# Patient Record
Sex: Male | Born: 1945 | Race: White | Hispanic: No | Marital: Married | Smoking: Former smoker
Health system: Southern US, Community
[De-identification: ages and names within clinical notes are randomized; demographics above are authoritative.]

## PROBLEM LIST (undated history)

## (undated) DIAGNOSIS — J449 Chronic obstructive pulmonary disease, unspecified: Secondary | ICD-10-CM

## (undated) DIAGNOSIS — G250 Essential tremor: Secondary | ICD-10-CM

## (undated) DIAGNOSIS — K589 Irritable bowel syndrome without diarrhea: Secondary | ICD-10-CM

## (undated) DIAGNOSIS — G4730 Sleep apnea, unspecified: Secondary | ICD-10-CM

## (undated) DIAGNOSIS — E781 Pure hyperglyceridemia: Secondary | ICD-10-CM

## (undated) DIAGNOSIS — T8859XA Other complications of anesthesia, initial encounter: Secondary | ICD-10-CM

## (undated) DIAGNOSIS — Z800 Family history of malignant neoplasm of digestive organs: Secondary | ICD-10-CM

## (undated) DIAGNOSIS — I6381 Other cerebral infarction due to occlusion or stenosis of small artery: Secondary | ICD-10-CM

## (undated) DIAGNOSIS — F429 Obsessive-compulsive disorder, unspecified: Secondary | ICD-10-CM

## (undated) DIAGNOSIS — M1990 Unspecified osteoarthritis, unspecified site: Secondary | ICD-10-CM

## (undated) DIAGNOSIS — R112 Nausea with vomiting, unspecified: Secondary | ICD-10-CM

## (undated) DIAGNOSIS — R251 Tremor, unspecified: Secondary | ICD-10-CM

## (undated) DIAGNOSIS — Z803 Family history of malignant neoplasm of breast: Secondary | ICD-10-CM

## (undated) DIAGNOSIS — C61 Malignant neoplasm of prostate: Secondary | ICD-10-CM

## (undated) DIAGNOSIS — K635 Polyp of colon: Secondary | ICD-10-CM

## (undated) DIAGNOSIS — T4145XA Adverse effect of unspecified anesthetic, initial encounter: Secondary | ICD-10-CM

## (undated) DIAGNOSIS — R0902 Hypoxemia: Secondary | ICD-10-CM

## (undated) DIAGNOSIS — K219 Gastro-esophageal reflux disease without esophagitis: Secondary | ICD-10-CM

## (undated) DIAGNOSIS — Z9889 Other specified postprocedural states: Secondary | ICD-10-CM

## (undated) HISTORY — DX: Family history of malignant neoplasm of breast: Z80.3

## (undated) HISTORY — DX: Chronic obstructive pulmonary disease, unspecified: J44.9

## (undated) HISTORY — DX: Polyp of colon: K63.5

## (undated) HISTORY — DX: Irritable bowel syndrome, unspecified: K58.9

## (undated) HISTORY — DX: Obsessive-compulsive disorder, unspecified: F42.9

## (undated) HISTORY — DX: Unspecified osteoarthritis, unspecified site: M19.90

## (undated) HISTORY — PX: TONSILLECTOMY: SUR1361

## (undated) HISTORY — PX: GREAT TOE ARTHRODESIS, INTERPHALANGEAL JOINT: SUR55

## (undated) HISTORY — PX: FINGER SURGERY: SHX640

## (undated) HISTORY — PX: KNEE ARTHROSCOPY: SUR90

## (undated) HISTORY — DX: Hypoxemia: R09.02

## (undated) HISTORY — DX: Pure hyperglyceridemia: E78.1

## (undated) HISTORY — DX: Malignant neoplasm of prostate: C61

## (undated) HISTORY — DX: Gastro-esophageal reflux disease without esophagitis: K21.9

## (undated) HISTORY — DX: Sleep apnea, unspecified: G47.30

## (undated) HISTORY — PX: PROSTATECTOMY: SHX69

## (undated) HISTORY — DX: Tremor, unspecified: R25.1

## (undated) HISTORY — DX: Family history of malignant neoplasm of digestive organs: Z80.0

## (undated) HISTORY — PX: BLADDER SURGERY: SHX569

## (undated) HISTORY — DX: Other cerebral infarction due to occlusion or stenosis of small artery: I63.81

## (undated) HISTORY — DX: Essential tremor: G25.0

---

## 2001-06-05 ENCOUNTER — Encounter (INDEPENDENT_AMBULATORY_CARE_PROVIDER_SITE_OTHER): Payer: Self-pay | Admitting: Specialist

## 2001-06-05 ENCOUNTER — Other Ambulatory Visit: Admission: RE | Admit: 2001-06-05 | Discharge: 2001-06-05 | Payer: Self-pay | Admitting: Gastroenterology

## 2003-01-12 ENCOUNTER — Encounter: Payer: Self-pay | Admitting: Internal Medicine

## 2003-01-12 ENCOUNTER — Ambulatory Visit (HOSPITAL_BASED_OUTPATIENT_CLINIC_OR_DEPARTMENT_OTHER): Admission: RE | Admit: 2003-01-12 | Discharge: 2003-01-12 | Payer: Self-pay | Admitting: Internal Medicine

## 2003-02-24 ENCOUNTER — Encounter: Payer: Self-pay | Admitting: Internal Medicine

## 2003-02-24 ENCOUNTER — Ambulatory Visit (HOSPITAL_BASED_OUTPATIENT_CLINIC_OR_DEPARTMENT_OTHER): Admission: RE | Admit: 2003-02-24 | Discharge: 2003-02-24 | Payer: Self-pay | Admitting: Internal Medicine

## 2004-08-12 ENCOUNTER — Ambulatory Visit: Payer: Self-pay | Admitting: Gastroenterology

## 2004-08-12 ENCOUNTER — Ambulatory Visit (HOSPITAL_COMMUNITY): Admission: RE | Admit: 2004-08-12 | Discharge: 2004-08-12 | Payer: Self-pay | Admitting: Gastroenterology

## 2005-05-13 ENCOUNTER — Emergency Department (HOSPITAL_COMMUNITY): Admission: EM | Admit: 2005-05-13 | Discharge: 2005-05-13 | Payer: Self-pay | Admitting: Family Medicine

## 2005-11-12 ENCOUNTER — Emergency Department (HOSPITAL_COMMUNITY): Admission: EM | Admit: 2005-11-12 | Discharge: 2005-11-12 | Payer: Self-pay | Admitting: Family Medicine

## 2005-11-22 ENCOUNTER — Ambulatory Visit (HOSPITAL_COMMUNITY): Admission: RE | Admit: 2005-11-22 | Discharge: 2005-11-22 | Payer: Self-pay | Admitting: Orthopedic Surgery

## 2005-12-22 ENCOUNTER — Ambulatory Visit (HOSPITAL_COMMUNITY): Admission: RE | Admit: 2005-12-22 | Discharge: 2005-12-23 | Payer: Self-pay | Admitting: Orthopedic Surgery

## 2007-10-15 ENCOUNTER — Encounter (INDEPENDENT_AMBULATORY_CARE_PROVIDER_SITE_OTHER): Payer: Self-pay | Admitting: Urology

## 2007-10-15 ENCOUNTER — Inpatient Hospital Stay (HOSPITAL_COMMUNITY): Admission: RE | Admit: 2007-10-15 | Discharge: 2007-10-16 | Payer: Self-pay | Admitting: Urology

## 2009-01-06 ENCOUNTER — Ambulatory Visit: Payer: Self-pay | Admitting: Internal Medicine

## 2009-01-06 DIAGNOSIS — J3089 Other allergic rhinitis: Secondary | ICD-10-CM

## 2009-01-06 DIAGNOSIS — J42 Unspecified chronic bronchitis: Secondary | ICD-10-CM

## 2009-01-06 DIAGNOSIS — J302 Other seasonal allergic rhinitis: Secondary | ICD-10-CM

## 2009-01-06 DIAGNOSIS — G4733 Obstructive sleep apnea (adult) (pediatric): Secondary | ICD-10-CM

## 2009-01-06 HISTORY — DX: Other seasonal allergic rhinitis: J30.2

## 2009-01-06 HISTORY — DX: Unspecified chronic bronchitis: J42

## 2009-01-06 HISTORY — DX: Obstructive sleep apnea (adult) (pediatric): G47.33

## 2009-01-08 ENCOUNTER — Encounter: Payer: Self-pay | Admitting: Internal Medicine

## 2009-01-09 ENCOUNTER — Encounter: Payer: Self-pay | Admitting: Internal Medicine

## 2009-01-09 ENCOUNTER — Ambulatory Visit: Payer: Self-pay | Admitting: Internal Medicine

## 2009-01-09 DIAGNOSIS — J45909 Unspecified asthma, uncomplicated: Secondary | ICD-10-CM

## 2009-01-09 HISTORY — DX: Unspecified asthma, uncomplicated: J45.909

## 2009-02-19 ENCOUNTER — Ambulatory Visit: Payer: Self-pay | Admitting: Internal Medicine

## 2009-04-27 ENCOUNTER — Ambulatory Visit: Payer: Self-pay | Admitting: Cardiovascular Disease

## 2009-04-30 ENCOUNTER — Ambulatory Visit: Payer: Self-pay

## 2009-04-30 ENCOUNTER — Ambulatory Visit: Payer: Self-pay | Admitting: Cardiovascular Disease

## 2009-04-30 ENCOUNTER — Encounter: Payer: Self-pay | Admitting: Cardiovascular Disease

## 2009-04-30 DIAGNOSIS — E7800 Pure hypercholesterolemia, unspecified: Secondary | ICD-10-CM

## 2009-04-30 HISTORY — DX: Pure hypercholesterolemia, unspecified: E78.00

## 2009-05-28 ENCOUNTER — Telehealth (INDEPENDENT_AMBULATORY_CARE_PROVIDER_SITE_OTHER): Payer: Self-pay | Admitting: *Deleted

## 2009-05-28 ENCOUNTER — Telehealth: Payer: Self-pay | Admitting: Internal Medicine

## 2009-06-04 ENCOUNTER — Telehealth: Payer: Self-pay | Admitting: Internal Medicine

## 2009-06-04 ENCOUNTER — Telehealth (INDEPENDENT_AMBULATORY_CARE_PROVIDER_SITE_OTHER): Payer: Self-pay | Admitting: *Deleted

## 2009-06-09 ENCOUNTER — Ambulatory Visit: Payer: Self-pay | Admitting: Internal Medicine

## 2009-06-29 ENCOUNTER — Encounter (INDEPENDENT_AMBULATORY_CARE_PROVIDER_SITE_OTHER): Payer: Self-pay | Admitting: *Deleted

## 2009-07-28 ENCOUNTER — Ambulatory Visit: Payer: Self-pay | Admitting: Cardiovascular Disease

## 2009-08-03 ENCOUNTER — Ambulatory Visit: Payer: Self-pay | Admitting: Gastroenterology

## 2009-08-13 ENCOUNTER — Ambulatory Visit: Payer: Self-pay | Admitting: Gastroenterology

## 2009-08-13 ENCOUNTER — Ambulatory Visit (HOSPITAL_COMMUNITY): Admission: RE | Admit: 2009-08-13 | Discharge: 2009-08-13 | Payer: Self-pay | Admitting: Gastroenterology

## 2009-10-19 ENCOUNTER — Ambulatory Visit: Payer: Self-pay | Admitting: Internal Medicine

## 2009-11-07 ENCOUNTER — Emergency Department (HOSPITAL_COMMUNITY): Admission: EM | Admit: 2009-11-07 | Discharge: 2009-11-07 | Payer: Self-pay | Admitting: Family Medicine

## 2009-12-25 ENCOUNTER — Telehealth (INDEPENDENT_AMBULATORY_CARE_PROVIDER_SITE_OTHER): Payer: Self-pay | Admitting: *Deleted

## 2010-08-27 ENCOUNTER — Telehealth (INDEPENDENT_AMBULATORY_CARE_PROVIDER_SITE_OTHER): Payer: Self-pay | Admitting: *Deleted

## 2010-10-28 NOTE — Assessment & Plan Note (Signed)
Summary: rov/jd   Primary Provider/Referring Provider:  Nadine Counts  CC:  Follow up visit- bad case of bronchitis in Nov; still having cough-non productive and tightness in chest. .  History of Present Illness: 01/09/09- OSA, COPD,recurrent allergic bronchitis He has delievered memory card from CPAP- download pending.-[facility]. Returns for review PFT- Mild obstructive airways disease in small airways with response to bronchodilator.  Skin Test: POS grass, weed, tree, dust mite  02/19/09- OSA, COPD/ recurrent allergic bornchitis Skin tested Pos Using CPAP all night every night, comfortable. Sleep quality definitely better. Does ask refill Nuvigil. Bronchitis- Better since Spring over. Can work outside now. Has not refilled Symbicort- using just Spiriva now. Ears feel full.  06/09/09- OSA, COPD/ recurrent allergic bronchitis-skin test positive. Allergies bother him some now. Astepro helps but dislikes taste. Spirivia willworks well and hasn't needed symbicort. CPAP compliance is great with nasal pillows mask.  October 19, 2009- OSA, COPD/ recurrent allergic bronchitis- skin test positive Had a bronchitis in November- Z pak then something else. Then got cortisone shot. He had used symbicort in past but wasn't sure it was effective. Cold air aggravates tight cough- dry. Wheezes. CPAP remains at 10  with good compliance.    Current Medications (verified): 1)  Multivitamins  Tabs (Multiple Vitamin) .... Take 1 By Mouth Once Daily 2)  Beconase Aq 42 Mcg/spray Susp (Beclomethasone Diprop Monohyd) .... As Needed 3)  Glucosamine 500 Mg Caps (Glucosamine Sulfate) .... Take 2 By Mouth Once Daily 4)  Singulair 10 Mg Tabs (Montelukast Sodium) .... Take 1 By Mouth Once Daily 5)  Nuvigil 250 Mg Tabs (Armodafinil) .[name].. 1 Daily If Needed 6)  [facility] Ibuprofen 200 Mg Tabs (Ibuprofen) .... Take As Needed 7)  Paxil 30 Mg Tabs (Paroxetine Hcl) .... Take 60mg  Once Daily 8)  Excedrin Extra Strength 250-250-65 Mg  Tabs (Aspirin-Acetaminophen-Caffeine) .... As Needed 9)  Propranolol Hcl 10 Mg Tabs (Propranolol Hcl) .... Take 1 By Mouth Once Daily 10)  Cpap 10 .... [facility] 11)  Spiriva Handihaler 18 Mcg Caps (Tiotropium Bromide Monohydrate) .[name].. 1 Daily 12)  Astepro 0.15 % Soln (Azelastine Hcl) .[name].. 1-2 Puffs Each Nostril Two Times A Day As Needed  Allergies (verified): 1)  ! Amoxicillin 2)  ! Levaquin 3)  ! Floxin  Past History:  Past Medical History: Last updated: 01/09/2009 ASTHMA (ICD-493.90) BRONCHITIS, CHRONIC (ICD-491.9) OBSTRUCTIVE SLEEP APNEA (ICD-327.23) ALLERGIC RHINITIS (ICD-477.9)  Family History: Last updated: 01/06/2009 Cancer  Social History: Last updated: 01/06/2009 [name] Married Quit smoking, ETOH, drugs- 1972  Past Surgical History: Prostatectomy Tonsillectomy UPPP  Review of Systems      See HPI       The patient complains of dyspnea on exertion and prolonged cough.  The patient denies anorexia, fever, weight loss, weight gain, vision loss, decreased hearing, hoarseness, chest pain, syncope, peripheral edema, headaches, hemoptysis, abdominal pain, and severe indigestion/heartburn.    Vital Signs:  Patient profile:   65 year old male Height:      70 inches Weight:      229.25 pounds BMI:     33.01 O2 Sat:      91 % on Room air Pulse rate:   88 / minute BP sitting:   112 / 64  (left arm) Cuff size:   regular  O2 Flow:  Room air  Physical Exam  Additional Exam:    General: A/Ox3; pleasant and cooperative, NAD, overweight SKIN: no rash, lesions, clammy NODES: no lymphadenopathy HEENT: [place]/AT, EOM- WNL, Conjuctivae- clear, PERRLA, TM-canals are  clear without fluid, cerumen, Nose- clear. Persistent dry sniffing, Throat- clear , S/P Palatoplasty, Melampatti II, husky voice without stridor NECK: Supple w/ fair ROM, JVD-1-2 cm, normal carotid impulses w/o bruits Thyroid-  CHEST: Clear to P&A, but cough noted once HEART: RRR, no m/g/r heard ABDOMEN:    GEX:BMWU, nl pulses, no edema . NEURO: Grossly intact to observation    Impression & Recommendations:  Problem # 1:  OBSTRUCTIVE SLEEP APNEA (ICD-327.23)  Good compliance and control. He recognizes he still gets tired occasionally. Reminded good sleep hygiene.  Problem # 2:  ASTHMA (ICD-493.90) Asthmatic bronchitis with exacerbation after infection. He continues Spiriva. I will add back at least a sample LABA/ steroid.  Medications Added to Medication List This Visit: 1)  Dulera 100-5 Mcg/act Aero (Mometasone furo-formoterol fum) .... 2 puffs and rinse twice daily  Other Orders: Est. Patient Level III (13244)  Patient Instructions: 1)  Please schedule a follow-up appointment in 4 months. 2)  Sample and script Dulera 100/5, 2 puffs and rinse, twice daily 3)  Continue CPAP Prescriptions: DULERA 100-5 MCG/ACT AERO (MOMETASONE FURO-FORMOTEROL FUM) 2 puffs and rinse twice daily  #1 x prn   Entered and Authorized by:   Waymon Budge MD   Signed by:   Waymon Budge MD on 10/19/2009   Method used:   Print then Give to Patient   RxID:   [id number]

## 2010-10-28 NOTE — Progress Notes (Signed)
Summary: nuvigil refill  Phone Note Refill Request Message from:  Pharmacy on [facility]  Refills Requested: Medication #1:  NUVIGIL 250 MG TABS 1 daily if needed Initial call taken by: Kandice Hams CMA,  August 27, 2010 4:14 PM Caller: [facility]    Prescriptions: NUVIGIL 250 MG TABS (ARMODAFINIL) 1 daily if needed  #30 x 5   Entered by:   Kandice Hams CMA   Authorized by:   Waymon Budge MD   Signed by:   Kandice Hams CMA on 08/27/2010   Method used:   Printed then faxed to ...       [facility] (retail)       [address]       [place], [place]  [id number]       Ph: [phone]       Fax: [phone]   RxID:   [id number]

## 2010-10-28 NOTE — Progress Notes (Signed)
Summary: REFILL on Nuvigil  Phone Note Call from Patient Call back at [phone]   Caller: Patient Call For: Daniel Terrell Summary of Call: NUVIGIL 250MG  90 DAY SUPPLY WITH REFILLS [facility] Initial call taken by: Rickard Patience,  December 25, 2009 11:21 AM  Follow-up for Phone Call        please advise if ok to refill. Thanks. Carron Curie CMA  December 25, 2009 11:28 AM   Additional Follow-up for Phone Call Additional follow up Details #1::        ok to refill X 5 months Additional Follow-up by: Waymon Budge MD,  December 25, 2009 1:33 PM    Additional Follow-up for Phone Call Additional follow up Details #2::    called and spoke with pt.  pt aware rx sent to pharmacy.  Aundra Millet Reynolds LPN  December 26, 4538 1:48 PM   Prescriptions: NUVIGIL 250 MG TABS (ARMODAFINIL) 1 daily if needed  #30 x 5   Entered by:   Arman Filter LPN   Authorized by:   Waymon Budge MD   Signed by:   Arman Filter LPN on 98/07/9146   Method used:   Telephoned to ...       [facility] (retail)       [address]       [address] Shipping/mailing       [place], [place]  [address]       Ph: [phone]       Fax: [phone]   RxID:   [id number]

## 2011-01-07 ENCOUNTER — Other Ambulatory Visit (HOSPITAL_COMMUNITY): Payer: Self-pay

## 2011-02-08 NOTE — Assessment & Plan Note (Signed)
[facility]                        [facility] OFFICE NOTE   Daniel Terrell, Daniel Terrell                    MRN:          [id number]  DATE:07/28/2009                            DOB:          12/06/1945    PROBLEM LIST:  1. Hypertriglyceridemia.  2. Obstructive sleep apnea on continuous positive airway pressure.  3. History of prostate cancer status post prostatectomy.   INTERVAL HISTORY:  The patient states since his last visit, he has been  getting along quite well.  He is trying to improve his physical  activity.  He has a treadmill at home that he uses infrequently, but  would like to start increasing the frequency of the exercise.  He denies  any episodes of chest discomfort.  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  He also  denies any episodes of dyspnea on exertion, lower extremity edema, or  syncope.   PHYSICAL EXAMINATION:  VITAL SIGNS:  Blood pressure is 111/79, pulse is  80, and sating 95% on room air.  He weighs 227 pounds, which is 1 pound  less than he weighed at the beginning of August.  GENERAL:  He is in no acute distress.  HEENT:  Nonfocal.  NECK:  Supple.  There are no carotid bruits.  There is no JVD.  HEART:  Regular rate and rhythm without murmur, rub, or gallop.  LUNGS:  Clear bilaterally.  ABDOMEN:  Soft, nontender, and nondistended.  EXTREMITIES:  Without edema.  SKIN:  Warm and dry.  PSYCH:  The patient is appropriate with normal levels of insight.   Review of the patient's labs the beginning of August, he had a  triglyceride level of 280, total cholesterol 180, HDL 40, and LDL 84.  Review of the patient's echocardiogram shows an ejection fraction of 55-  60% with mild AR and mild MR.   ASSESSMENT AND PLAN:  The patient is doing well from a cardiovascular  standpoint.  He is not having any signs or symptoms consistent with  ischemia, heart failure, or  arrhythmia.  His triglyceride levels are  elevated, but his non-HDL is within an acceptable range.  We spoke at  length about lifestyle modifications such as improved diet and increased  physical activity.  The patient will make a concerted effort at this.  We will see him back in 4 months' time, at which time we will recheck a  fasting lipid profile.  He should continue using his CPAP for sleep  apnea as well.    Brayton El, MD  Electronically Signed   SGA/MedQ  DD: 07/28/2009  DT: 07/29/2009  Job #: [id number]

## 2011-02-08 NOTE — Op Note (Signed)
NAME:  Daniel Terrell, Daniel Terrell NO.:  [id number]   MEDICAL RECORD NO.:  [id number]          PATIENT TYPE:  INP   LOCATION:  NA                           [facility]ACILITY[facility]  [facility]   PHYSICIAN:  Valetta Fuller, M.D.  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   PROCEDURE PERFORMED:  Robotic assisted laparoscopic radical retropubic  prostatectomy.   SURGEON:  Valetta Fuller, M.D.   ASSISTANT:  Heloise Purpura, MD.   ANESTHESIA:  General endotracheal.   INDICATIONS:  Daniel Terrell is a 65 year old male.  He was sent to me  with a minimally elevated PSA of approximately 4.1.  A digital rectal  exam was unremarkable.  He had a moderate to significant obstructive and  irritative voiding symptoms.  Biopsy revealed moderately enlarged  prostate of 40 grams.  The patient had low-volume Gleason's 3+3=6 tumor  involving about 5-10% of the tissue sampled at the right mid and left  apex of the prostate.  The patient had relatively normal sexual  functioning.  We underwent extensive discussion with him about treatment  options.  Because of the patient's young age and good health, we did  suggested consideration for surgical removal.  We discussed at length  the advantages and disadvantages of surgery versus radiation.  We  specifically discussed issues with regard to incontinence and sexual  dysfunction as well as the typical complications that can occur with  surgery of this magnitude.  The patient elected to proceed with robotic  prostatectomy.  He has done preoperative physical therapy consultation.  He presents now for the procedure.  The patient did have perioperative  Unasyn and compression boots placed.   TECHNIQUE AND  FINDINGS:  The patient was brought to the operating room  where he had successful induction of general endotracheal anesthesia.  He was placed in the mid lithotomy position.  All extremities were  carefully padded and he was secured to the bed.  He was placed in a  steep Trendelenburg position and prepped and draped in the usual manner.  Foley catheter was inserted sterilely on the field.   Initial incision site was chosen 18 cm above the pubic symphysis just to  the left of the umbilicus.  An open Hasson technique was utilized to  place the initial 12 mm trocar which was done without difficulty and  palpably no adhesions or other abnormalities were noted.  The abdomen  was insufflated without incident.  The pelvis was examined with the  laparoscopic and no obvious problems were noted.  All additional trocars  were placed with direct visual guidance including 12 mm and a 5 mm  assist port and three 8 mm robotic trocar ports.  Once all trocars were  positioned, the surgical cart was docked.   The bladder was filled to aid identification and then dropped  posteriorly.  Electrocautery scissors were  used to establish the proper  plane, allowing for [place] to enter the space of Retzius.  Superficial fat  overlying the prostatic fascia and the pelvic sidewall was taken off  with a combination of blunt and sharp dissecting technique and utilizing  some electrocautery.  Endopelvic fascia was then incised from apex to  the base of the prostate bilaterally.  Puboprostatic ligaments were  taken down.  Levator musculature was swept off the apex of the prostate  allowing [place] to isolate the dorsal venous complex which was then stapled.  Hemostasis was good.  Attention was then turned towards the bladder  neck.  The Foley catheter was used to identify the anterior bladder neck  which was then incised with hot electrocautery scissors until the  catheter was identified in the midline.  The catheter was then  brought  out and retracted anteriorly.  There was no evidence of a middle lobe.  Indigo carmine was given and we appeared to be well away from the  ureteral orifices.  The posterior bladder neck was then transected.  The  vas deferens and seminal vesicles were then identified posteriorly and  individually dissected free and retracted anteriorly.  The posterior  plane between the prostate and rectum was then established from the base  to the apex of the prostate and out laterally.   Attention was then turned towards nerve spare.  Superficial fascia over  the lateral aspect of the prostate was incised sharply and then swept  posteriorly.  We were able to establish a plane between the  neurovascular bundle and the outer superficial fascia of the prostate  and this was swept from apex to base.  Once this was accomplished were  able to lift up the base of the prostate and identify the vascular  pedicles which were then taken with hemoclips.  The last remaining bit  of the neurovascular bundle was then swept off the apex of the prostate.  The anterior urethra was then transected and the underlying catheter was  removed.  The posterior urethra was then transected and specimen was  brought out of the pelvis.  There appeared to be a good amount of  neurovascular bundle tissue on both sides.  No active bleeding or  problems.  Posterior fascia was then reapproximated to provide  additional bladder support utilizing a 2-0 Vicryl suture.  The 6 o'clock  urethral stump was then reanastomosed to the 6 o'clock position on the  bladder neck and those structures were then brought together to allow  for a tension-free anastomosis.  The rest the anastomosis was done with  a double-armed running 3-0 Monocryl suture in a 360 degree manner.  At  the completion of the anastomosis, a new catheter was placed without  difficulty.  The bladder was irrigated.  No evidence of extravasation  was noted.  We did not  feel the pelvic lymph node dissection was  necessary given his staging numbers with low-volume Gleason's 6 disease  and a PSA of only 4.  A drain was placed in the pelvis in the retropubic  space and then secured to the skin.  The 12 mm trocar site was closed  with a 0 Vicryl with the aid of suture passer under direct visual  guidance.  The prostate specimen was placed in the Endopouch bag.  All  other trocars were taken out with visual guidance and no active bleeding  was noted.  The camera port incision was extended slightly to allow for  removal of the specimen and that fascia was then closed with a running 0  Vicryl suture.  All port sites were infiltrated and then closed with  clips.  Urine was clear.  The patient had no obvious complications.  Blood loss was relatively minimal and no more than 200-300 mL.  He was  brought to recovery room in stable condition.           ______________________________  Valetta Fuller, M.D.  Electronically Signed     DSG/MEDQ  D:  10/15/2007  T:  10/15/2007  Job:  [id number]

## 2011-02-08 NOTE — Assessment & Plan Note (Signed)
Daniel Terrell                        [facility] NOTE   Daniel Terrell, Daniel Terrell                    MRN:          [id number]  DATE:04/27/2009                            DOB:          July 02, 1946    CHIEF COMPLAINT:  Abnormal EKG.   HISTORY OF PRESENT ILLNESS:  Daniel Terrell is a 65 year old white male  past medical history significant for obstructive sleep apnea on CPAP  therapy, borderline hyperlipidemia, asthma, history of prostate cancer,  status post prostatectomy who is presenting after his EKG was read as  abnormal by an outside physician.  Patient was currently enrolled in a  study that involved post-prostatectomy followup.  As part of the study,  he had a 12-lead EKG performed which was read as abnormal and indicated  he may have had a myocardial infarction in the past.  Unfortunately,  this EKG is not currently available for review.  Patient states that he  has no known cardiac history.  He denies any episodes of chest  discomfort or shortness of breath, although the patient states he does  not exert himself too much.  The most active he is is doing work around  the yard.  However, he has no difficulty doing this.   PAST MEDICAL HISTORY:  Patient had a laparoscopic prostatectomy 1-2  years ago.  He has obstructive sleep apnea, for which he uses a CPAP.  He has irritable bowel syndrome, diverticulosis, OCD, asthma, borderline  hyperlipidemia and left hand trigger finger, status post trigger finger  release.   SOCIAL HISTORY:  No tobacco, no alcohol since 1972.  He is a Engineer, production here in town.   FAMILY HISTORY:  Probably negative for premature coronary disease.  However, there is some question as to his paternal history.   ALLERGIES:  LEVAQUIN and PENICILLIN.  Patient states LEVAQUIN causes  disorientation, and PENICILLIN caused GI upset.   MEDICATIONS:  1. Testosterone gel daily.  2. Paxil 60 mg daily.  3. Provigil 250  mg daily.  4. Singulair 10 mg daily.  5. Spiriva 18 mcg daily.  6. Propranolol 20 mg daily.  7. Beconase 42 mcg b.i.d.  8. Glucosamine sulfate 1000 mg daily.  9. L-Lysine 500 mg as needed.  10.Men's multivitamin daily.  11.Omega-369 complex daily.   REVIEW OF SYSTEMS:  As in HPI.  All other systems were reviewed and are  negative.   PHYSICAL EXAMINATION:  VITAL SIGNS:  Pulse is 85, blood pressure 117/86,  saturating 96% on room air.  He weighs 228 pounds.  GENERALLY:  In no acute distress.  HEENT:  Not focal.  NECK:  Supple.  There is no lymphadenopathy, carotid bruit or JVD.  HEART:  Regular rate and rhythm without murmur, rub, or gallop.  LUNGS:  Clear bilaterally.  ABDOMEN:  Soft, nontender, nondistended.  EXTREMITIES:  Without edema.  SKIN:  Warm and dry without lesion or rash.  PULSES:  There are 2+ bilateral radial pulses.  NEURO:  Nonfocal.  PSYCHIATRIC:  Patient is appropriate with normal levels of insight.   An EKG from today independently reviewed by myself demonstrates normal  sinus rhythm with a leftward axis.  There is an isolated Q wave in lead  III.  However, there is a small R wave in aVF indicating that there  likely has not been an inferior Q-wave infarction.  There are no other  ST or  T-wave abnormalities that are significant.   LABORATORIES:  Not available at this time.   ASSESSMENT:  A 65 year old white male with obstructive sleep apnea and  borderline hyperlipidemia presenting with an EKG that is consistent with  a leftward axis but likely no evidence of history of obstructive  coronary disease or myocardial infarction.  Patient also is not  experiencing any symptoms or displaying any signs of obstructive  coronary disease, although his physical activity is somewhat limited.   PLAN:  Today in clinic, we would like to order a transthoracic  echocardiogram to ensure that his left ventricular systolic function is  within normal limits.  We would also  like to check a fasting lipid  profile in order to further assess his risk factor profile.  We will  contact the patient once the results of these studies are obtained, and  we will see him back in clinic in approximately 3 months' time unless a  problem were to arise in the interim.     Brayton El, MD  Electronically Signed    SGA/MedQ  DD: 04/27/2009  DT: 04/27/2009  Job #: [id number]

## 2011-02-08 NOTE — Letter (Signed)
April 27, 2009    Nadine Counts, M.D.  [address]  [place], [place] [address]   RE:  [name], [name]  MRN:  [id number]  /  DOB:  27-Apr-1946   Dear Dr. Harrison Mons:   I had the pleasure of seeing your patient, Daniel Terrell, in  Cardiology Clinic today regarding a possible abnormal EKG.  At the time  of this office visit, the initial EKG was not available.  However, a  repeat EKG today in the office showed normal sinus rhythm with a  leftward axis.  There does not appear to be any evidence of an old  inferior infarction.  There is an isolated Q wave in lead III.  However,  upon close inspection, aVF has a small R wave present.  We will order a  transthoracic echocardiogram in order to evaluate his left ventricular  systolic function and assess for any wall motion abnormalities.  We will  also recommend that he begin therapy with aspirin 81 mg daily for  prophylaxis.  Lastly, we have ordered a fasting lipid profile to further  assess his risk factor profile.  We see no current need for any imaging  studies.  However, the patient is informed that if he were to begin to  experience any new symptoms such as chest pain or shortness of breath,  that he should certainly return to our office for further evaluation.  I  hope that you are happy with this approach.  Thank you for the  opportunity to see this patient, and please feel free to contact our  office at any time with any questions or concerns.    Sincerely,      Brayton El, MD  Electronically Signed   SGA/MedQ  DD: 04/27/2009  DT: 04/27/2009  Job #: [id number]

## 2011-02-11 NOTE — Op Note (Signed)
NAMEDEBRA, Daniel Terrell             ACCOUNT NO.:  [id number]   MEDICAL RECORD NO.:  [id number]            PATIENT TYPE:   LOCATION:                                 FACILITY:   PHYSICIAN:  Georges Lynch. Darrelyn Hillock, M.D.     DATE OF BIRTH:   DATE OF PROCEDURE:  12/22/2005  DATE OF DISCHARGE:                                 OPERATIVE REPORT   SURGEON:  Georges Lynch. Darrelyn Hillock, M.D.   ASSISTANT:  Nurse.   PREOP DIAGNOSIS:  Severe tear to the medial meniscus of the left knee.   POSTOP DIAGNOSIS:  Severe tear to the medial meniscus of the left knee.   OPERATION:  1.  Diagnostic arthroscopy left knee.  2.  Medial meniscectomy left knee.   DESCRIPTION OF PROCEDURE:  Under general anesthesia, routine orthopedic prep  and drape of the left lower extremity is carried out.  I blocked the three  portals with 1% Xylocaine.  A small punctate incision made in the  suprapatellar pouch.  Inflow cannula was inserted and knee was distended  with saline.  Prior to surgery, he had 1 gram of IV Ancef.  At this time,  another small punctate incision made in the anterolateral joint.  Arthroscope was entered from the lateral approach and a complete diagnostic  arthroscopy was carried out.  He had a severe complex tear of the medial  meniscus.   I introduced the shaver suction device in a medial approach and did a medial  meniscectomy.  The remaining part of the knee joint was intact.  I then  utilized the ArthroCare bovie to cauterize the medial soft tissue structures  to control the minimal bleeding that we had.  I thoroughly irrigated out the  knee and removed the fluid.  I closed all 3 punctate incisions with 3-0  nylon suture.  I then injected 30 mL of 1/2% Marcaine with epinephrine into  the knee joint; and then applied a sterile Neosporin dressing.           ______________________________  Georges Lynch. Darrelyn Hillock, M.D.     RAG/MEDQ  D:  12/22/2005  T:  12/23/2005  Job:  [id number]

## 2011-02-18 ENCOUNTER — Other Ambulatory Visit (INDEPENDENT_AMBULATORY_CARE_PROVIDER_SITE_OTHER): Payer: 59

## 2011-02-18 ENCOUNTER — Ambulatory Visit (INDEPENDENT_AMBULATORY_CARE_PROVIDER_SITE_OTHER): Payer: 59 | Admitting: Gastroenterology

## 2011-02-18 ENCOUNTER — Encounter: Payer: Self-pay | Admitting: Gastroenterology

## 2011-02-18 VITALS — BP 104/72 | HR 76 | Ht 70.0 in | Wt 216.0 lb

## 2011-02-18 DIAGNOSIS — R7989 Other specified abnormal findings of blood chemistry: Secondary | ICD-10-CM

## 2011-02-18 LAB — COMPREHENSIVE METABOLIC PANEL
ALT: 31 U/L (ref 0–53)
AST: 21 U/L (ref 0–37)
Alkaline Phosphatase: 73 U/L (ref 39–117)
Chloride: 103 mEq/L (ref 96–112)
Glucose, Bld: 94 mg/dL (ref 70–99)
Sodium: 137 mEq/L (ref 135–145)
Total Bilirubin: 0.5 mg/dL (ref 0.3–1.2)
Total Protein: 6.9 g/dL (ref 6.0–8.3)

## 2011-02-18 LAB — CBC WITH DIFFERENTIAL/PLATELET
Basophils Absolute: 0 10*3/uL (ref 0.0–0.1)
Basophils Relative: 0.4 % (ref 0.0–3.0)
MCHC: 35 g/dL (ref 30.0–36.0)
Monocytes Relative: 9.9 % (ref 3.0–12.0)
Neutro Abs: 2.7 10*3/uL (ref 1.4–7.7)
Neutrophils Relative %: 63.1 % (ref 43.0–77.0)
RBC: 5 Mil/uL (ref 4.22–5.81)

## 2011-02-18 LAB — SEDIMENTATION RATE: Sed Rate: 9 mm/hr (ref 0–22)

## 2011-02-18 NOTE — Progress Notes (Signed)
Review of pertinent gastrointestinal problems: 1. Adenomas, SML, most recent colonoscopy 2010 no polyps; recall at 5 year interval     HPI: This is a  very pleasant 65 year old man whom I last saw the time of a surveillance colonoscopy one to 2 years ago.   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 Started iron pills, Mvi as well for many years.   He was in his neurologist office recently and had a ferritin level checked. This was 600.  Has lost 20 pounds recently, intentionally, eating a lighter BF and lunch and then health shakes.  Never told he had liver problems in the past.  No liver disease in family, no hemochromatosis.  No real new meds recently but inderol increased.  No GI symptoms.    Review of systems: Pertinent positive and negative review of systems were noted in the above HPI section.  All other review of systems was otherwise negative.   Past Medical History, Past Surgical History, Family History, Social History, Current Medications, Allergies were all reviewed with the patient via [facility] electronic medical record system.   Physical Exam: BP 104/72  Pulse 76  Ht 5\' 10"  (1.778 m)  Wt 216 lb (97.977 kg)  BMI 30.99 kg/m2 Constitutional: generally well-appearing Psychiatric: alert and oriented x3 Eyes: extraocular movements intact Mouth: oral pharynx moist, no lesions Neck: supple no lymphadenopathy Cardiovascular: heart regular rate and rhythm Lungs: clear to auscultation bilaterally Abdomen: soft, nontender, nondistended, no obvious ascites, no peritoneal signs, normal bowel sounds Extremities: no lower extremity edema bilaterally Skin: no lesions on visible extremities    Assessment and plan: 65 y.o. male with elevated ferritin  This may indicate underlying iron overload, it is also well known acute phase reactant. He will get a basic set of labs today including CBC, complete metabolic profile, sedimentation rate, total iron level, TIBC. If these  are all normal I would simply repeat his ferritin in about 2 weeks' time.

## 2011-02-18 NOTE — Patient Instructions (Signed)
You will have labs checked today in the basement lab.  Please head down after you check out with the front desk  (cbc, cmet, total iron, TIBC, esr).  If these are unrevealing, will likely repeat ferritin in 14 days. A copy of this information will be made available to Dr. Tomasa Blase, Sandria Manly.

## 2011-02-22 ENCOUNTER — Other Ambulatory Visit: Payer: Self-pay | Admitting: Gastroenterology

## 2011-03-08 ENCOUNTER — Other Ambulatory Visit (INDEPENDENT_AMBULATORY_CARE_PROVIDER_SITE_OTHER): Payer: 59

## 2011-03-08 LAB — FERRITIN: Ferritin: 396.4 ng/mL — ABNORMAL HIGH (ref 22.0–322.0)

## 2011-03-14 ENCOUNTER — Other Ambulatory Visit: Payer: Self-pay | Admitting: Gastroenterology

## 2011-03-14 DIAGNOSIS — R7989 Other specified abnormal findings of blood chemistry: Secondary | ICD-10-CM

## 2011-05-23 ENCOUNTER — Encounter: Payer: Self-pay | Admitting: Internal Medicine

## 2011-05-23 ENCOUNTER — Ambulatory Visit (INDEPENDENT_AMBULATORY_CARE_PROVIDER_SITE_OTHER): Payer: 59 | Admitting: Internal Medicine

## 2011-05-23 VITALS — BP 108/58 | HR 87 | Ht 70.0 in | Wt 214.8 lb

## 2011-05-23 DIAGNOSIS — J42 Unspecified chronic bronchitis: Secondary | ICD-10-CM

## 2011-05-23 DIAGNOSIS — J309 Allergic rhinitis, unspecified: Secondary | ICD-10-CM

## 2011-05-23 MED ORDER — MOMETASONE FUROATE 50 MCG/ACT NA SUSP: 2.0000 | Freq: Every day | NASAL | Status: DC

## 2011-05-23 MED ORDER — BECLOMETHASONE DIPROPIONATE 40 MCG/ACT IN AERS: 2.0000 | INHALATION_SPRAY | Freq: Two times a day (BID) | RESPIRATORY_TRACT | Status: DC

## 2011-05-23 NOTE — Patient Instructions (Signed)
Sample/ script  QVAR 40 maintenance inhaler- 2 puffs and rinse mouth, twice daily  Script Nasonex nasal spray to replace Beconase

## 2011-05-23 NOTE — Assessment & Plan Note (Signed)
Because of cost, switch from Beconase to nasonex

## 2011-05-23 NOTE — Assessment & Plan Note (Signed)
Over 3 weeks we have had a lot of rain, and earliest Fall weather changes. Cough is nonspecific.  Plan tramadol as cough suppressant at his request Try Qvar 40-

## 2011-05-23 NOTE — Progress Notes (Signed)
Subjective:    Patient ID: Daniel Terrell, male    DOB: [birth year], 65 y.o.   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 Having to sleep sitting up for 3 weeks at night. Denies fever, sore throat or malaise.  Expects sinus infection in November. In the Spring his PCP gave steroid shot and antibiotic for sinusitis- helped on 2 occasions.  Has been taking singulair, Beconase AQ, cetirizine. 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 He expresses interest in tramadol for cough- helps his wife.  Review of Systems Constitutional:   No-   weight loss, night sweats, fevers, chills, fatigue, lassitude. HEENT:   No-  headaches, difficulty swallowing, tooth/dental problems, sore throat,       No-  sneezing, itching, ear ache,         + nasal congestion, post nasal drip,  CV:  No-   chest pain, orthopnea, PND, swelling in lower extremities, anasarca, dizziness, palpitations Resp: No-   shortness of breath with exertion or at rest.              Per HPI;  No-  coughing up of blood.              No-   change in color of mucus.  No- wheezing.   Skin: No-   rash or lesions. GI:  No-   heartburn, indigestion, abdominal pain, nausea, vomiting, diarrhea,                 change in bowel habits, loss of appetite GU: No-   dysuria, change in color of urine, no urgency or frequency.  No- flank pain. MS:  No-   joint pain or swelling.  No- decreased range of motion.  No- back pain. Neuro- grossly normal to observation, Or:  Psych:  No- change in mood or affect. No depression or anxiety.  No memory loss.      Objective:   Physical Exam General- Alert, Oriented, Affect-appropriate, Distress- none acute   overweight Skin- rash-none, lesions- none,  excoriation- none Lymphadenopathy- none Head- atraumatic            Eyes- Gross vision intact, PERRLA, conjunctivae clear secretions            Ears- Hearing, canals normal            Nose- Clear, No- Septal dev, mucus, polyps, erosion, perforation             Throat- + S/P UPPP, mucosa clear , drainage- none, tonsils- atrophic Neck- flexible , trachea midline, no stridor , thyroid nl, carotid no bruit Chest - symmetrical excursion , unlabored           Heart/CV- RRR , no murmur , no gallop  , no rub, nl s1 s2                           - JVD- none , edema- none, stasis changes- none, varices- none           Lung- clear to P&A, wheeze- none, cough- none , dullness-none, rub- none   Not coughing at all while here           Chest wall-  Abd- tender-no, distended-no, bowel sounds-present, HSM- no  Br/ Gen/ Rectal- Not done, not indicated Extrem- cyanosis- none, clubbing, none, atrophy- none, strength- nl Neuro- grossly intact to observation         Assessment & Plan:

## 2011-05-24 ENCOUNTER — Telehealth: Payer: Self-pay | Admitting: Internal Medicine

## 2011-05-24 MED ORDER — ARMODAFINIL 250 MG PO TABS: 125.0000 mg | ORAL_TABLET | Freq: Every day | ORAL | Status: DC

## 2011-05-24 NOTE — Telephone Encounter (Signed)
Spoke with the pt and he requests 90 day supply. rx sent. Pt aware. Carron Curie, CMA

## 2011-06-15 ENCOUNTER — Encounter: Payer: Self-pay | Admitting: Cardiovascular Disease

## 2011-06-16 LAB — BASIC METABOLIC PANEL
BUN: 16
CO2: 28
CO2: 30
Chloride: 101
Chloride: 103
Creatinine, Ser: 0.81
Creatinine, Ser: 0.95
GFR calc Af Amer: >60
Potassium: 4.9

## 2011-06-16 LAB — ABO/RH: ABO/RH(D): O POS

## 2011-06-16 LAB — CBC
HCT: 38.7 — ABNORMAL LOW
Hemoglobin: 13.6
MCHC: 35
MCV: 92.5
RBC: 4.19 — ABNORMAL LOW

## 2011-06-16 LAB — DIFFERENTIAL
Basophils Relative: 1
Eosinophils Absolute: 0
Eosinophils Relative: 0
Lymphs Abs: 0.7
Monocytes Absolute: 0.6
Monocytes Relative: 6
Neutrophils Relative %: 85 — ABNORMAL HIGH

## 2011-06-16 LAB — TYPE AND SCREEN

## 2011-08-04 ENCOUNTER — Telehealth: Payer: Self-pay | Admitting: Internal Medicine

## 2011-08-04 MED ORDER — OLOPATADINE HCL 0.6 % NA SOLN: 2.0000 | Freq: Every day | NASAL | Status: DC

## 2011-08-04 MED ORDER — FLUTICASONE PROPIONATE 50 MCG/ACT NA SUSP: 2.0000 | Freq: Every day | NASAL | Status: DC

## 2011-08-04 NOTE — Telephone Encounter (Signed)
Pt states Dr. Maple Hudson suggested an alternative in the past for Beconase NS. Requesting same due to insurance no longer covering Beconase NS. I do see Nasonex on pt's medication list, however, the name did not sound familiar to pt. Also requesting a new prescription for Patanase NS, he uses this only as needed. Please advise. Thanks.   Allergies  Allergen Reactions  . Amoxicillin   . Levofloxacin     REACTION: disorientation  Can take Avelox  . Ofloxacin     REACTION: disorientation  Can take Avelox   Pharmacy: [facility]

## 2011-08-04 NOTE — Telephone Encounter (Signed)
Per CY-ok to refill patanase prn and ok to give Rx for Flonase #1 1-2 puffs each nostril daily at bedtime with prn refills.     Left message on patient's phone that we have sent the requested medications to the pharmacy on file-[facility].

## 2011-09-07 ENCOUNTER — Encounter: Payer: Self-pay | Admitting: Cardiovascular Disease

## 2011-10-11 ENCOUNTER — Telehealth: Payer: Self-pay | Admitting: Internal Medicine

## 2011-10-11 NOTE — Telephone Encounter (Signed)
LMOM TCB x1.  Flonase is to be taken 2 puffs once daily per rx.

## 2011-10-12 NOTE — Telephone Encounter (Signed)
LMOMTCB for pt. Spoke with [facility] pharmacist, Arlys John, and he is aware directions for Flonase are 2 sprays in each nostril once daily.

## 2011-10-13 NOTE — Telephone Encounter (Signed)
LMOM for pt TCB.  This is our 3rd attempt to contact pt with no call back from him.  Per protocol, will sign off on this message and wait for pt to call back.

## 2011-10-18 ENCOUNTER — Telehealth: Payer: Self-pay | Admitting: Internal Medicine

## 2011-10-18 NOTE — Telephone Encounter (Signed)
I spoke with pt and advised him we did not have any samples of flonase. Pt voiced his understanding and had no questions

## 2011-11-30 ENCOUNTER — Other Ambulatory Visit: Payer: Self-pay | Admitting: Internal Medicine

## 2011-12-05 ENCOUNTER — Telehealth: Payer: Self-pay | Admitting: Internal Medicine

## 2011-12-05 NOTE — Telephone Encounter (Signed)
Per records we had sent an rx for nuvigil with 1 yr supply on 04-2011. Per pharmacists we can only give 6 refills at a time for this med, so I gave a verbal ok to give the pt 345 tabs with 2 additonal refills to complete the 1 yr rx given on 8-12. Pt is aware. Carron Curie, CMA

## 2011-12-05 NOTE — Telephone Encounter (Signed)
Pt called again and hasn't heard anything

## 2011-12-07 NOTE — Telephone Encounter (Signed)
Electronic refill request for nuvigil 250mg  1 po qd.  #90 with 3 refills requested.  Last ov with CDY 8.27.12, follow up prn.  Dr Maple Hudson please advise if okay to fill.

## 2011-12-07 NOTE — Telephone Encounter (Signed)
Ok to refill needs OV to keep giving it long term.

## 2012-03-19 ENCOUNTER — Telehealth: Payer: Self-pay | Admitting: Internal Medicine

## 2012-03-19 MED ORDER — ARMODAFINIL 250 MG PO TABS: 250.0000 mg | ORAL_TABLET | Freq: Every day | ORAL | Status: DC

## 2012-03-19 NOTE — Telephone Encounter (Signed)
I have called RX to pharmacy and updated med list. I called to schedule patient an OV but had to leave a message.Will wait for patient to call me back to schedule OV.

## 2012-03-19 NOTE — Telephone Encounter (Signed)
Our original script was for 1/2 tab daily. I will ok a single 90 day script to take 1 full tab daily, = 90 tabs. He won't get any more until he comes in to review sleep status. He was last here in August, 2012.

## 2012-03-19 NOTE — Telephone Encounter (Signed)
I spoke with pt and he is needing a refill on his nuvigil 250 mg. It shows this rx was called in 12/05/11 #90 QD but per pt his bottle states 1/2 tablet daily #45 x 2 refills. I called [facility] out[facility] pharmacy to confirm this and she states this was how it was called in. Please advise if okay to refill nuvigil 250 mg QD #90 Dr. Maple Hudson thanks

## 2012-03-19 NOTE — Telephone Encounter (Signed)
Judie Grieve (Ilda Basset) at [facility] pharm called. Leanora Ivanoff

## 2012-03-20 NOTE — Telephone Encounter (Signed)
Per Florentina Addison I have scheduled a f/u appt for pt. His appt is 05-01-12. Pt is aware that rx has been called in as well. Nothing further needed at this time. Hazel Sams

## 2012-05-01 ENCOUNTER — Encounter: Payer: Self-pay | Admitting: Internal Medicine

## 2012-05-01 ENCOUNTER — Ambulatory Visit (INDEPENDENT_AMBULATORY_CARE_PROVIDER_SITE_OTHER): Payer: 59 | Admitting: Internal Medicine

## 2012-05-01 VITALS — BP 112/78 | HR 90 | Ht 70.0 in | Wt 222.2 lb

## 2012-05-01 DIAGNOSIS — J019 Acute sinusitis, unspecified: Secondary | ICD-10-CM

## 2012-05-01 DIAGNOSIS — G4733 Obstructive sleep apnea (adult) (pediatric): Secondary | ICD-10-CM

## 2012-05-01 DIAGNOSIS — J42 Unspecified chronic bronchitis: Secondary | ICD-10-CM

## 2012-05-01 NOTE — Patient Instructions (Addendum)
Sample Dymista nasal spray    1-2 puffs each nostril every night at bedtime. While the sample lasts, it replaces flonase and patanase  Please call as needed

## 2012-05-01 NOTE — Progress Notes (Signed)
Subjective:    Patient ID: Daniel Terrell, male    DOB: 21-Aug-1946, 66 y.o.   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 Having to sleep sitting up for 3 weeks at night. Denies fever, sore throat or malaise.  Expects sinus infection in November. In the Spring his PCP gave steroid shot and antibiotic for sinusitis- helped on 2 occasions.  Has been taking singulair, Beconase AQ, cetirizine. 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 He expresses interest in tramadol for cough- helps his wife.   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 He states that he has had one sinus infection in the past week-treated with Doxy and Kenolog injection. pt c/o PND left over from sinus infection. Onset was after trip to [place] with climate changes and airplane flights. Still has morning drainage and is sleeping in a chair. Chest/bronchitis bothers more than rhinitis. Using Flonase, not Nasonex or Patanase. Nuvigil continues to be a big help with daytime sleepiness. Sleep hygiene reviewed.  Review of Systems Constitutional:   No-   weight loss, night sweats, fevers, chills, fatigue, lassitude. HEENT:   No-  headaches, difficulty swallowing, tooth/dental problems, sore throat,       No-  sneezing, itching, ear ache,         + nasal congestion, post nasal drip,  CV:  No-   chest pain, orthopnea, PND, swelling in lower extremities, anasarca, dizziness, palpitations Resp: No-   shortness of breath with exertion or at rest.              Per HPI;  No-  coughing up of blood.              No-   change in color of mucus.   No- wheezing.   Skin: No-   rash or lesions. GI:  No-   heartburn, indigestion, abdominal pain, nausea, vomiting,  GU:  MS:  No-   joint pain or swelling.   Neuro- nothing unusual Psych:  No- change in mood or affect. No depression or anxiety.  No memory loss. Objective:   Physical Exam General- Alert, Oriented, Affect-appropriate, Distress- none acute   overweight Skin- rash-none, lesions- none, excoriation- none Lymphadenopathy- none Head- atraumatic            Eyes- Gross vision intact, PERRLA, conjunctivae clear secretions            Ears- Hearing, canals normal            Nose- sniffing but clear , No- Septal dev, mucus, polyps, erosion, perforation             Throat- + S/P UPPP, mucosa clear , drainage- none, tonsils- atrophic Neck- flexible , trachea midline, no stridor , thyroid nl, carotid no bruit Chest - symmetrical excursion , unlabored           Heart/CV- RRR , no murmur , no gallop  , no rub, nl s1 s2                           - JVD- none , edema- none, stasis changes- none, varices- none  Lung- clear to P&A, wheeze- none, cough- none , dullness-none, rub- none   Not coughing at all while here           Chest wall-  Abd-  Br/ Gen/ Rectal- Not done, not indicated Extrem- cyanosis- none, clubbing, none, atrophy- none, strength- nl Neuro- grossly intact to observation Assessment & Plan:

## 2012-05-06 DIAGNOSIS — J019 Acute sinusitis, unspecified: Secondary | ICD-10-CM

## 2012-05-06 HISTORY — DX: Acute sinusitis, unspecified: J01.90

## 2012-05-06 NOTE — Assessment & Plan Note (Signed)
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

## 2012-05-06 NOTE — Assessment & Plan Note (Signed)
Acute exacerbation of chronic bronchitis

## 2012-05-06 NOTE — Assessment & Plan Note (Signed)
Resolving rhinosinusitis. I think he has had enough antibiotics. Plan-sample Dymista nasal spray

## 2012-07-03 ENCOUNTER — Other Ambulatory Visit: Payer: Self-pay | Admitting: Internal Medicine

## 2012-07-03 NOTE — Telephone Encounter (Signed)
Please advise if okay to refill. Thanks.  

## 2012-07-03 NOTE — Telephone Encounter (Signed)
Ok to refill nuvigil

## 2012-09-20 ENCOUNTER — Other Ambulatory Visit: Payer: Self-pay | Admitting: Internal Medicine

## 2012-09-20 MED ORDER — FLUTICASONE PROPIONATE 50 MCG/ACT NA SUSP: 2.0000 | Freq: Every day | NASAL | Status: DC

## 2012-09-20 NOTE — Telephone Encounter (Signed)
Rx sent 

## 2012-09-20 NOTE — Telephone Encounter (Signed)
Pharmacy requesting  Fluticasone 50 mcg >use 2 sprays in eash nostril at bedtime  Last fill 03-28-12 Allergies  Allergen Reactions  . Amoxicillin   . Levofloxacin     REACTION: disorientation  Can take Avelox  . Ofloxacin     REACTION: disorientation  Can take Avelox   Dr Maple Hudson please advise  Thank you

## 2012-11-15 ENCOUNTER — Telehealth: Payer: Self-pay | Admitting: Internal Medicine

## 2012-11-16 NOTE — Telephone Encounter (Signed)
Forms and message on CY's cart to fill out.

## 2012-11-16 NOTE — Telephone Encounter (Signed)
Per LC she has the PA and to forward to her.

## 2012-11-16 NOTE — Telephone Encounter (Signed)
Ok to refill Nuvigil 

## 2012-11-16 NOTE — Telephone Encounter (Signed)
PA for Nuvigil 250 mg once daily. Member ID# [id number]. Call [employer] at [phone]. Forms received and given to [facility].  Will forward msg to [facility] for follow-up.

## 2012-11-20 NOTE — Telephone Encounter (Signed)
Ready- in [employer]

## 2012-11-20 NOTE — Telephone Encounter (Signed)
CY has this on his cart if working on this.

## 2012-11-20 NOTE — Telephone Encounter (Signed)
PA form faxed and placed in waiting for approval box in triage.  Will forward message to Florentina Addison to await approval.

## 2012-11-20 NOTE — Telephone Encounter (Signed)
Daniel Terrell, this was a Prior Auth not a refill request.  Pls advise if Prior Auth form has been completed.  Thank you.

## 2012-12-03 NOTE — Telephone Encounter (Signed)
Katie have you received approval yet. Please advise thanks

## 2012-12-12 NOTE — Telephone Encounter (Signed)
[employer] insurance several times-each time they are behind or closed. Will call Thursday AM(12-13-12)

## 2012-12-18 NOTE — Telephone Encounter (Signed)
Spoke with [employer] [place] rep; was informed that PA was approved from 11-20-12 through 08-16-2015; no waiting period for patient as well. Pt has been informed and will go get RX.  Auth # from [facility] for medication: [id number].  Sent forms to HIM to scan in EPIC.

## 2013-03-15 ENCOUNTER — Encounter: Payer: Self-pay | Admitting: Neurology

## 2013-04-19 ENCOUNTER — Other Ambulatory Visit: Payer: Self-pay | Admitting: Internal Medicine

## 2013-04-22 NOTE — Telephone Encounter (Signed)
Please advise if okay to refill. Thanks.  

## 2013-04-22 NOTE — Telephone Encounter (Signed)
Ok to refill 

## 2013-04-29 ENCOUNTER — Ambulatory Visit (INDEPENDENT_AMBULATORY_CARE_PROVIDER_SITE_OTHER): Payer: Medicare Other | Admitting: Neurology

## 2013-04-29 ENCOUNTER — Encounter: Payer: Self-pay | Admitting: Neurology

## 2013-04-29 VITALS — BP 120/91 | HR 126 | Temp 97.7°F | Ht 70.0 in | Wt 234.5 lb

## 2013-04-29 DIAGNOSIS — G252 Other specified forms of tremor: Secondary | ICD-10-CM

## 2013-04-29 DIAGNOSIS — G250 Essential tremor: Secondary | ICD-10-CM

## 2013-04-29 DIAGNOSIS — G2581 Restless legs syndrome: Secondary | ICD-10-CM

## 2013-04-29 MED ORDER — PRAMIPEXOLE DIHYDROCHLORIDE 0.125 MG PO TABS: 0.1250 mg | ORAL_TABLET | Freq: Every day | ORAL | Status: DC

## 2013-04-29 MED ORDER — PRIMIDONE 50 MG PO TABS: ORAL_TABLET | ORAL | Status: DC

## 2013-04-29 MED ORDER — PROPRANOLOL HCL ER 60 MG PO CP24: 60.0000 mg | ORAL_CAPSULE | Freq: Every day | ORAL | Status: DC

## 2013-04-29 NOTE — Patient Instructions (Addendum)
I think overall you are doing fairly well but I do want to suggest a few things today:  As far as your medications are concerned, I would like to suggest no change in Propranolol or mirapex, but trial of an additional medication for tremor: Mysoline (primidone) 50 mg strength: Take 1/2 pill each bedtime for 2 weeks, then 1 pill each bedtime for 2 weeks, then 1 1/2 pills each bedtime for 2 weeks, then 2 pills each bedtime thereafter. Common side effects reported are: Sleepiness, drowsiness, balance problems, confusion, and GI related symptoms.  As far as diagnostic testing: no new test today.  I would like to see you back in 4 months, sooner if we need to. Please call us with any interim questions, concerns, problems, updates or refill requests.  Please talk to Dr. Maple Hudson about getting a new CPAP machine, as the newer machines are quieter.   Brett Canales is my clinical assistant and will answer any of your questions and relay your messages to me and also relay most of my messages to you.  Our phone number is [phone]. We also have an after hours call service for urgent matters and there is a physician on-call for urgent questions. For any emergencies you know to call 911 or go to the nearest emergency room.

## 2013-04-29 NOTE — Progress Notes (Signed)
Subjective:    Patient ID: Daniel Terrell is a 67 y.o. male.  HPI  Interim history:   Daniel Terrell is a very pleasant 67 year old right-handed gentleman who presents for followup consultation of his essential tremor. He is unaccompanied today. He previously saw Dr. Fayrene Terrell love and was last seen by him on 11/13/2012 at which time he was switched to long-acting propranolol. Dr. Sandria Terrell was hesitant to put him on primidone in view of his memory loss. His last MMSE was 27. He also has an underlying medical history of asthma so Dr. Sandria Terrell was hesitant to increase his propranolol and dose. There is a family history of kidney stones but no personal history of kidney stones and he was also hesitant to try him on Topamax because of this as well as the cognitive side effects that can produce. The patient has an underlying medical history of prostate cancer, status post surgery in January 2009, Sleep apnea on CPAP, asthma, OCD, restless leg syndrome, and memory loss. He is currently on Xanax as needed, baby aspirin, Flonase, Paxil, multivitamin, individual, Singulair, Zyrtec, facial, AndroGel, propranolol ER 60 mg, pramipexole 0.125 mg strength one tablet at supper and one at bedtime.  I reviewed Dr. Imagene Terrell prior notes and the patient's records and below is a summary of his review:  67 year old gentleman with a history of tremor which started in his 30s. This has gradually been progressive. He has had difficulty with penmanship as well as at work as he was a Landscape architect. He has difficulty with other activities such as playing the guitar. He was started on Xanax in 2012 by his primary care physician and was started on propranolol 20 mg 3 times a day by Dr. love this was decreased to 20 mg daily. He had fatigue. He has a family history of tremor. He also has restless leg syndrome and has been taking iron and was tried on ropinirole without benefit. He is now on Mirapex. He has seen Dr. Maple Terrell for his sleep apnea and is  currently not using the CPAP. He has had memory loss since his prostate surgery.  He feels, that going on long acting Propranolol has helped, but he still has a tremor. It has affected his hands, but not his neck or jaw or voice. His RLS is under control with Mirapex 0.125 mg strength one pill at night only. He has actually not taken it twice daily.  His Past Medical History Is Significant For: Past Medical History  Diagnosis Date  . Arthritis   . Asthma   . Prostate cancer   . Colon polyps   . COPD (chronic obstructive pulmonary disease)   . OCD (obsessive compulsive disorder)   . Hypertriglyceridemia   . IBS (irritable bowel syndrome)   . Sleep apnea     His Past Surgical History Is Significant For: Past Surgical History  Procedure Laterality Date  . Prostatectomy    . Knee arthroscopy      left  . Great toe arthrodesis, interphalangeal joint    . Finger surgery      left x 3  . Bladder surgery      extension  . Tonsillectomy      His Family History Is Significant For: Family History  Problem Relation Age of Onset  . Breast cancer Maternal Grandmother   . Colon cancer Maternal Grandfather   . Colon polyps Mother     son    His Social History Is Significant For: History   Social History  .  Marital Status: Married    Spouse Name: Daniel Terrell    Number of Children: 3  . Years of Education: BA   Occupational History  . pastor   . Retired    Social History Main Topics  . Smoking status: Former Smoker    Types: Cigarettes    Quit date: 09/26/1970  . Smokeless tobacco: Never Used  . Alcohol Use: No  . Drug Use: No  . Sexually Active: None   Other Topics Concern  . None   Social History Narrative   Patient lives at home with spouse.   Caffeine Use: 2-3 cups daily    His Allergies Are:  Allergies  Allergen Reactions  . Amoxicillin   . Levofloxacin     REACTION: disorientation  Can take Avelox  . Ofloxacin     REACTION: disorientation  Can take Avelox   :   His Current Medications Are:  Outpatient Encounter Prescriptions as of 04/29/2013  Medication Sig Dispense Refill  . ALPRAZolam (XANAX) 0.5 MG tablet Take 0.5 mg by mouth as needed.        [name] aspirin 81 MG tablet Take 81 mg by mouth daily.        . cetirizine (ZYRTEC) 10 MG tablet Take 10 mg by mouth daily.        . fluticasone (FLONASE) 50 MCG/ACT nasal spray Place 2 sprays into the nose at bedtime.  1 g  9  . glucosamine-chondroitin 500-400 MG tablet Take 1 tablet by mouth 2 (two) times daily.        . montelukast (SINGULAIR) 10 MG tablet Take 10 mg by mouth daily.        . Multiple Vitamins-Minerals (MULTIVITAL PO) Take 1 tablet by mouth daily.        [name] NUVIGIL 250 MG tablet TAKE 1 TABLET BY MOUTH ONCE DAILY  90 tablet  2  . Omega-3 Fatty Acids (FISH OIL) 1000 MG CAPS Take 1 capsule by mouth daily.        [name] PARoxetine (PAXIL) 30 MG tablet Take 60 mg by mouth at bedtime as needed.       . pramipexole (MIRAPEX) 0.125 MG tablet Take 1 tablet (0.125 mg total) by mouth at bedtime.  90 tablet  3  . Testosterone 1.25 GM/ACT (1%) GEL Place 2 Squirts onto the skin daily.       . [DISCONTINUED] pramipexole (MIRAPEX) 0.125 MG tablet Take 0.125 mg by mouth daily.        . [DISCONTINUED] propranolol (INDERAL) 20 MG tablet Take 20 mg by mouth daily.       . beclomethasone (QVAR) 40 MCG/ACT inhaler Inhale 2 puffs into the lungs 2 (two) times daily. Rinse mouth  1 Inhaler  2  . mometasone (NASONEX) 50 MCG/ACT nasal spray Place 2 sprays into the nose daily.  17 g  12  . primidone (MYSOLINE) 50 MG tablet 1/2 pill each bedtime x 2 weeks, then 1 pill nightly x 2 weeks, then 1 1/2 pills nightly x 2 weeks, then 2 pills nightly thereafter.  60 tablet  3  . propranolol ER (INDERAL LA) 60 MG 24 hr capsule Take 1 capsule (60 mg total) by mouth daily.  90 capsule  3  . [DISCONTINUED] Beclomethasone Diprop Monohyd (BECONASE AQ NA) 2 Squirts by Nasal route 2 (two) times daily.        . [DISCONTINUED] Olopatadine  HCl 0.6 % SOLN Place 2 drops (2 puffs total) into the nose daily.  1 Bottle  11   No facility-administered encounter medications on file as of 04/29/2013.   Review of Systems  Constitutional: Positive for fatigue.  Respiratory: Positive for cough, shortness of breath and wheezing.        Snoring  Neurological: Positive for tremors.       Memory loss  Psychiatric/Behavioral: Positive for sleep disturbance (sleepiness, snoring, restless legs).    Objective:  Neurologic Exam  Physical Exam Physical Examination:   Filed Vitals:   04/29/13 1101  BP: 120/91  Pulse: 126  Temp: 97.7 F (36.5 C)    General Examination: The patient is a very pleasant 67 y.o. male in no acute distress. He appears well-developed and well-nourished and well groomed.   HEENT: Normocephalic, atraumatic, pupils are equal, round and reactive to light and accommodation. Extraocular tracking is good without limitation to gaze excursion or nystagmus noted. Normal smooth pursuit is noted. Hearing is grossly intact. Tympanic membranes are clear bilaterally. Face is symmetric with normal facial animation and normal facial sensation. Speech is clear with no dysarthria noted. There is no hypophonia. There is no lip, neck/head, jaw or voice tremor. Neck is supple with full range of passive and active motion. There are no carotid bruits on auscultation. Oropharynx exam reveals: mild mouth dryness, good dental hygiene and mildly redundant airway with absent tonsils and absent uvula. Mallampati is class II. Tongue protrudes centrally and palate elevates symmetrically.   Chest: Clear to auscultation without wheezing, rhonchi or crackles noted.  Heart: S1+S2+0, regular and normal without murmurs, rubs or gallops noted.   Abdomen: Soft, non-tender and non-distended with normal bowel sounds appreciated on auscultation.  Extremities: There is no pitting edema in the distal lower extremities bilaterally. Pedal pulses are  intact.  Skin: Warm and dry without trophic changes noted. There are no varicose veins.  Musculoskeletal: exam reveals no obvious joint deformities, tenderness or joint swelling or erythema.   Neurologically:  Mental status: The patient is awake, alert and oriented in all 4 spheres. His memory, attention, language and knowledge are appropriate. There is no aphasia, agnosia, apraxia or anomia. Speech is clear with normal prosody and enunciation. Thought process is linear. Mood is congruent and affect is normal.  Cranial nerves are as described above under HEENT exam. In addition, shoulder shrug is normal with equal shoulder height noted. Motor exam: Normal bulk, strength and tone is noted. There is a minimal resting tremor in both UEs, and a mild to moderate bilateral UE postural and action tremor. On Archimedes spiral drawing he has mild to moderate tremulousness. His handwriting is reviewed and it is mildly tremulous but legible. No drift and no rebound. Romberg is negative. Reflexes are 2+ throughout. Fine motor skills are intact with normal finger taps, normal hand movements, normal rapid alternating patting, normal foot taps and normal foot agility.  Cerebellar testing shows no dysmetria or intention tremor on finger to nose testing. Heel to shin is unremarkable bilaterally. There is no truncal or gait ataxia.  Sensory exam is intact to light touch, pinprick, vibration, temperature sense and proprioception in the upper and lower extremities.  Gait, station and balance are unremarkable. No veering to one side is noted. No leaning to one side is noted. Posture is age-appropriate and stance is narrow based. No problems turning are noted. He turns en bloc. Tandem walk is unremarkable. Intact toe and heel stance is noted.               Assessment and Plan:   Assessment and  Plan:  In summary, Daniel Terrell is a very pleasant 66 y.o.-year old male with a history of essential tremor, affecting both  upper extremities and a history of restless leg syndrome. His memory loss has plateaued in his perception. He is advised to continue with his propranolol for tremors as well as Mirapex for restless leg syndrome. As an additional medication for his residual tremor I suggested a low dose Mysoline with a slow titration. I talked to him about potential side effects including confusion and sedation. I did ask him to report any side effects to me immediately. We will start with 50 mg strength half a pill at bedtime only and titrate every 2 weeks to up to 100 mg at night. I provided him with a new prescription for this medication and also encouraged him to talk to his pulmonary doctor about perhaps getting a new CPAP machine that may be quieter. He has an old machine that is over 95 years old. He is advised that memory loss can be linked to untreated OSA. He has an appointment coming up with Dr. Maple Terrell in.less than 2 weeks. I would like to see him back in about 4 months, sooner if the need arises and I have encouraged him to call with any interim questions, concerns, problems or updates and refill requests.  he was in agreement. If Mysoline works out well for him he is encouraged to call for a 90 day prescription so we can send it to [facility].

## 2013-05-07 ENCOUNTER — Encounter: Payer: Self-pay | Admitting: Internal Medicine

## 2013-05-07 ENCOUNTER — Ambulatory Visit (INDEPENDENT_AMBULATORY_CARE_PROVIDER_SITE_OTHER): Payer: Medicare Other | Admitting: Internal Medicine

## 2013-05-07 VITALS — BP 138/84 | HR 93 | Ht 71.0 in | Wt 237.0 lb

## 2013-05-07 DIAGNOSIS — J302 Other seasonal allergic rhinitis: Secondary | ICD-10-CM

## 2013-05-07 DIAGNOSIS — J45909 Unspecified asthma, uncomplicated: Secondary | ICD-10-CM

## 2013-05-07 DIAGNOSIS — J309 Allergic rhinitis, unspecified: Secondary | ICD-10-CM

## 2013-05-07 DIAGNOSIS — G4733 Obstructive sleep apnea (adult) (pediatric): Secondary | ICD-10-CM

## 2013-05-07 NOTE — Progress Notes (Signed)
Subjective:    Patient ID: Daniel Terrell, male    DOB: 25-Aug-1946, 67 y.o.   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 Having to sleep sitting up for 3 weeks at night. Denies fever, sore throat or malaise.  Expects sinus infection in November. In the Spring his PCP gave steroid shot and antibiotic for sinusitis- helped on 2 occasions.  Has been taking singulair, Beconase AQ, cetirizine. 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 He expresses interest in tramadol for cough- helps his wife.   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 He states that he has had one sinus infection in the past week-treated with Doxy and Kenolog injection. pt c/o PND left over from sinus infection. Onset was after trip to [place] with climate changes and airplane flights. Still has morning drainage and is sleeping in a chair. Chest/bronchitis bothers more than rhinitis. Using Flonase, not Nasonex or Patanase. Nuvigil continues to be a big help with daytime sleepiness. Sleep hygiene reviewed.  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  Machine is noisy and sweats alot on head with mask on.  Occas wheezing and occas cough - prod at times.  No SOB, chest tightness, or chest pain. Complains CPAP nasal pillows mask face sweaty. Stopped using CPAP 7 or 8 months ago. Nasal congestion better control with Singulair. Occasional cough without wheeze if works  outdoors.  Review of Systems- see HPI Constitutional:   No-   weight loss, night sweats, fevers, chills, fatigue, lassitude. HEENT:   No-  headaches, difficulty swallowing, tooth/dental problems, sore throat,       + sneezing, itching, ear ache, + nasal congestion, post nasal drip,  CV:  No-   chest pain, orthopnea, PND, swelling in lower extremities, anasarca, dizziness, palpitations Resp: No-   shortness of breath with exertion or at rest.              Per HPI;  No-  coughing up of blood.              No-   change in color of mucus.  + wheezing.   Skin: No-   rash or lesions. GI:  No-   heartburn, indigestion, abdominal pain, nausea, vomiting,  GU:  MS:  No-   joint pain or swelling.   Neuro- nothing unusual Psych:  No- change in mood or affect. No depression or anxiety.  No memory loss. Objective:   Physical Exam General- Alert, Oriented, Affect-appropriate, Distress- none acute   overweight Skin- rash-none, lesions- none, excoriation- none Lymphadenopathy- none Head- atraumatic            Eyes- Gross vision intact, PERRLA, conjunctivae clear secretions            Ears- Hearing, canals normal            Nose- +sniffing but clear , No- Septal dev, mucus, polyps, erosion, perforation             Throat- + S/P UPPP, mucosa clear , drainage- none, tonsils- atrophic Neck-  flexible , trachea midline, no stridor , thyroid nl, carotid no bruit Chest - symmetrical excursion , unlabored           Heart/CV- RRR , no murmur , no gallop  , no rub, nl s1 s2                           - JVD- none , edema- none, stasis changes- none, varices- none           Lung- clear to P&A, wheeze- none, cough- none , dullness-none, rub- none   Not coughing at all while here           Chest wall-  Abd-  Br/ Gen/ Rectal- Not done, not indicated Extrem- cyanosis- none, clubbing, none, atrophy- none, strength- nl Neuro- grossly intact to observation Assessment & Plan:

## 2013-05-07 NOTE — Patient Instructions (Addendum)
Order- referral to Orthodontics Dr Althea Grimmer  Dx OSA consider oral appliance  We can address the nasal symptoms and cough if needed  Please call as needed

## 2013-05-22 NOTE — Assessment & Plan Note (Addendum)
NPSG 01/12/03 AHI 42/ hr, loud snore, weight 200 lbs Unable to get comfortable with CPAP. He has been using Nuvigil and Xanax we discussed these. He may be a reasonable candidate for an oral appliance. Will refer. Also advised weight loss.

## 2013-05-22 NOTE — Assessment & Plan Note (Signed)
Antihistamines may be sufficient but we talked again about nasal sprays.

## 2013-05-22 NOTE — Assessment & Plan Note (Signed)
We considered rescue inhaler but not really needed now.

## 2013-06-21 ENCOUNTER — Telehealth: Payer: Self-pay | Admitting: Internal Medicine

## 2013-06-21 MED ORDER — MODAFINIL 100 MG PO TABS
ORAL_TABLET | ORAL | Status: DC
Start: 2013-06-21 — End: 2013-06-24

## 2013-06-21 NOTE — Telephone Encounter (Signed)
I spoke with spouse and is aware rx has been sent. They request 90 day supply. Nothing further needed

## 2013-06-21 NOTE — Telephone Encounter (Signed)
I spoke with pt. He stated the nuvigil will cost him $100 a month. He stated he can't afford that. He stated an alternative would be provigil bc it comes in generic. Please advise Dr. Maple Hudson thanks Last OV 05/07/13

## 2013-06-21 NOTE — Telephone Encounter (Signed)
Per CY-try Rx Provigil 100 mg #0 take 1 every morning prn with 5 refills.

## 2013-06-24 ENCOUNTER — Telehealth: Payer: Self-pay | Admitting: Internal Medicine

## 2013-06-24 MED ORDER — MODAFINIL 200 MG PO TABS: 200.0000 mg | ORAL_TABLET | Freq: Every day | ORAL | Status: DC

## 2013-06-24 NOTE — Telephone Encounter (Signed)
Spoke with patient, patient had Provigil 100mg  called into [facility] 9/26 Patient states he was taking Nuvigil 250mg  prior and he feels the Provigil is not strong enough for him as he is till falling asleep Dr. Maple Hudson please advise if Rx for Provigil 200mg  may be sent to [facility] Thank You!  Last OV 05/07/13 w 1 yr follow up not scheduled at this time N  Current Outpatient Prescriptions on File Prior to Visit  Medication Sig Dispense Refill  . ALPRAZolam (XANAX) 0.5 MG tablet Take 0.5 mg by mouth as needed.        [name] aspirin 81 MG tablet Take 81 mg by mouth daily.        . beclomethasone (QVAR) 40 MCG/ACT inhaler Inhale 2 puffs into the lungs 2 (two) times daily. Rinse mouth  1 Inhaler  2  . cetirizine (ZYRTEC) 10 MG tablet Take 10 mg by mouth daily.        . fluticasone (FLONASE) 50 MCG/ACT nasal spray Place 2 sprays into the nose daily as needed.      [name] glucosamine-chondroitin 500-400 MG tablet Take 1 tablet by mouth 2 (two) times daily.        . modafinil (PROVIGIL) 100 MG tablet 1 tablet every morning as needed  90 tablet  1  . montelukast (SINGULAIR) 10 MG tablet Take 10 mg by mouth daily.        . Multiple Vitamins-Minerals (MULTIVITAL PO) Take 1 tablet by mouth daily.        [name] NUVIGIL 250 MG tablet TAKE 1 TABLET BY MOUTH ONCE DAILY  90 tablet  2  . Omega-3 Fatty Acids (FISH OIL) 1000 MG CAPS Take 1 capsule by mouth daily.        [name] PARoxetine (PAXIL) 30 MG tablet Take 60 mg by mouth at bedtime as needed.       . pramipexole (MIRAPEX) 0.125 MG tablet Take 1 tablet (0.125 mg total) by mouth at bedtime.  90 tablet  3  . primidone (MYSOLINE) 50 MG tablet 1/2 pill each bedtime x 2 weeks, then 1 pill nightly x 2 weeks, then 1 1/2 pills nightly x 2 weeks, then 2 pills nightly thereafter.  60 tablet  3  . propranolol ER (INDERAL LA) 60 MG 24 hr capsule Take 1 capsule (60 mg total) by mouth daily.  90 capsule  3  . Testosterone 1.25 GM/ACT (1%) GEL Place 2 Squirts onto the skin daily.         No current facility-administered medications on file prior to visit.

## 2013-06-24 NOTE — Telephone Encounter (Signed)
Ok to change his script to Provigil 200 mg, # 90, 1 daily as needed

## 2013-06-24 NOTE — Telephone Encounter (Signed)
Spoke with patient patient is aware Rx has been phoned in to [facility] [employer] has been cancelled Nothing further needed at this time

## 2013-07-09 ENCOUNTER — Telehealth: Payer: Self-pay | Admitting: Internal Medicine

## 2013-07-09 NOTE — Telephone Encounter (Signed)
Florentina Addison do you have any forms on this patient? Carron Curie, CMA

## 2013-07-09 NOTE — Telephone Encounter (Signed)
PA papers today-on CY's cart to sign so I may fax back.

## 2013-07-10 NOTE — Telephone Encounter (Signed)
I have faxed PA request back to insurance company as an URGENT request. Will await a decision.

## 2013-07-17 NOTE — Telephone Encounter (Signed)
Spoke with [employer] [place] as the form sent to [place] by [facility] was for [facility]- we did PA over the phone and was approved from 07-17-13 through 07-17-14. Pt is aware.

## 2013-08-01 ENCOUNTER — Other Ambulatory Visit: Payer: Self-pay

## 2013-08-29 ENCOUNTER — Ambulatory Visit: Payer: Medicare Other | Admitting: Neurology

## 2013-09-17 ENCOUNTER — Other Ambulatory Visit: Payer: Self-pay

## 2013-09-17 DIAGNOSIS — G250 Essential tremor: Secondary | ICD-10-CM

## 2013-09-17 MED ORDER — PRIMIDONE 50 MG PO TABS: ORAL_TABLET | ORAL | Status: DC

## 2013-09-17 NOTE — Telephone Encounter (Signed)
Pharmacy requests 90 day Rx  

## 2013-10-02 ENCOUNTER — Encounter (INDEPENDENT_AMBULATORY_CARE_PROVIDER_SITE_OTHER): Payer: Medicare HMO | Admitting: Ophthalmology

## 2013-10-02 DIAGNOSIS — H43819 Vitreous degeneration, unspecified eye: Secondary | ICD-10-CM

## 2013-10-02 DIAGNOSIS — H33309 Unspecified retinal break, unspecified eye: Secondary | ICD-10-CM

## 2013-10-02 DIAGNOSIS — H2510 Age-related nuclear cataract, unspecified eye: Secondary | ICD-10-CM

## 2013-10-09 ENCOUNTER — Ambulatory Visit: Payer: Medicare Other | Admitting: Neurology

## 2013-10-21 ENCOUNTER — Telehealth: Payer: Self-pay | Admitting: Internal Medicine

## 2013-10-21 MED ORDER — MODAFINIL 200 MG PO TABS: 200.0000 mg | ORAL_TABLET | Freq: Every day | ORAL | Status: DC

## 2013-10-21 NOTE — Telephone Encounter (Signed)
Ok to refill as requested. I just need to see him once a year.

## 2013-10-21 NOTE — Telephone Encounter (Signed)
Rx has been called in. Pt is aware. Per DEA guidelines 6 month supply is all that can be called in at one time.

## 2013-10-21 NOTE — Telephone Encounter (Signed)
Last OV 05/07/13 No pending OV - recall for 03/2014 Last fill 06/24/13 #90  CY - please advise on refill. Thanks.

## 2013-10-28 ENCOUNTER — Telehealth: Payer: Self-pay | Admitting: Internal Medicine

## 2013-10-28 NOTE — Telephone Encounter (Signed)
Called [facility] and was advised pt provigil went through on  10/21/13-does not show if this needs PA in the future.  I called pt and he reports he received a letter in the mail stating this needs PA. He received a 30 day supply instead of 90 day. He reports the # is [phone] ID #: [id number] I called and initiated PA. This was approved. Cased ID # [id number] Pt is not able to get this filled until his 30 day supply is done.  I called and made pt aware. Nothing further needed

## 2013-11-27 ENCOUNTER — Telehealth: Payer: Self-pay | Admitting: Neurology

## 2013-11-27 NOTE — Telephone Encounter (Signed)
Spoke with patient and he said that the primidone is causing erectile dysfunction, drogginess and for the pass 2 mos.has been giving him a problem. Is there a substitute?

## 2013-11-27 NOTE — Telephone Encounter (Signed)
Patient verbalized understanding Dr Guadelupe Sabin instructions below for tapering off the primidone, scheduled/confirmed  sooner appt. W/ Ms Hassell Done

## 2013-11-27 NOTE — Telephone Encounter (Signed)
Pt called to schedule appointment with Dr. Rexene Alberts.  Her first appointments available are on July 23rd.  Pt stated he did not want to wait that long as he is having problems with his Primidone.  He states that it is having an adverse effect on him physically and he is experiencing some ED problems.  Please call to discuss if it would be possible for him to get an earlier appointment.  Thank you.

## 2013-11-27 NOTE — Telephone Encounter (Signed)
Please advise patient to taper off of primidone by reducing it by 1/2 every three days to a full stop. Unfortunately, there are no substitutes for primidone. There are not a lot of options to treat tremors. We can try to get him in with one of the nurse practitioners for a sooner appointment.

## 2013-11-28 ENCOUNTER — Encounter: Payer: Self-pay | Admitting: Nurse Practitioner

## 2013-11-28 ENCOUNTER — Ambulatory Visit (INDEPENDENT_AMBULATORY_CARE_PROVIDER_SITE_OTHER): Payer: Medicare HMO | Admitting: Nurse Practitioner

## 2013-11-28 VITALS — BP 125/82 | HR 75 | Ht 71.5 in | Wt 240.0 lb

## 2013-11-28 DIAGNOSIS — G252 Other specified forms of tremor: Principal | ICD-10-CM

## 2013-11-28 DIAGNOSIS — G2581 Restless legs syndrome: Secondary | ICD-10-CM | POA: Insufficient documentation

## 2013-11-28 DIAGNOSIS — G250 Essential tremor: Secondary | ICD-10-CM | POA: Insufficient documentation

## 2013-11-28 HISTORY — DX: Restless legs syndrome: G25.81

## 2013-11-28 NOTE — Progress Notes (Addendum)
[facility]  PATIENT: [name] DOB: 1946-04-01   REASON FOR VISIT: Essential tremor   HISTORY OF PRESENT ILLNESS:Daniel Terrell, 68 year old male returns for followup. He was last seen in this office by Dr. Rexene Terrell 04/29/2013. 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 There is a family history of tremor. He also has a history of restless leg syndrome. Primidone was added to his treatment regimen at his last visit and he continued on his long-acting propanolol. Patient called in to the office yesterday stating that he primidone was causing erectile dysfunction and he wanted to be weaned off of the medication. He is here for instructions.  HISTORY: essential tremor. He previously saw Dr. Morene Terrell and was last seen by him on 11/13/2012 at which time he was switched to long-acting propranolol. Dr. Erling Terrell was hesitant to put him on primidone in view of his memory loss. His last MMSE was 27. He also has an underlying medical history of asthma so Dr. Erling Terrell was hesitant to increase his propranolol and dose. There is a family history of kidney stones but no personal history of kidney stones and he was also hesitant to try him on Topamax because of this as well as the cognitive side effects that can produce. The patient has an underlying medical history of prostate cancer, status post surgery in January 2009, Sleep apnea on CPAP, asthma, OCD, restless leg syndrome, and memory loss.   The tremor has  gradually been progressive. 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 He has difficulty with other activities such as playing the guitar. He was started on Xanax in 2012 by his primary care physician and was started on propranolol 20 mg 3 times a day by Daniel Terrell this was decreased to 20 mg daily. He had fatigue.  He is now on Mirapex for RLS. [name] He has seen Dr. Annamaria Boots for his sleep apnea and is currently not using the CPAP. He has had memory loss  since his prostate surgery.  He feels, that going on long acting Propranolol has helped, but he still has a tremor. It has affected his hands, but not his neck or jaw or voice. His RLS is under control with Mirapex 0.125 mg strength one pill at night only. He has actually not taken it twice daily.  REVIEW OF SYSTEMS: Full 14 system review of systems performed and notable only for those listed, all others are neg:  Constitutional: N/A  Cardiovascular: N/A  Ear/Nose/Throat: N/A  Skin: N/A  Eyes: N/A  Respiratory: Shortness of breath, cough  Gastroitestinal: N/A  Hematology/Lymphatic: N/A  Endocrine: N/A Musculoskeletal:N/A  Allergy/Immunology: N/A  Neurological: Tremor  Psychiatric: N/A Sleep obstructive sleep apnea   ALLERGIES: Allergies  Allergen Reactions  . Amoxicillin   . Levofloxacin     REACTION: disorientation  Can take Avelox  . Ofloxacin     REACTION: disorientation  Can take Avelox    HOME MEDICATIONS: Outpatient Prescriptions Prior to Visit  Medication Sig Dispense Refill  . ALPRAZolam (XANAX) 0.5 MG tablet Take 0.5 mg by mouth as needed.        [name] aspirin 81 MG tablet Take 81 mg by mouth daily.        . cetirizine (ZYRTEC) 10 MG tablet Take 10 mg by mouth daily.        [name] glucosamine-chondroitin 500-400 MG tablet Take 1 tablet by mouth 2 (two) times daily.        [name]  modafinil (PROVIGIL) 200 MG tablet Take 1 tablet (200 mg total) by mouth daily.  90 tablet  1  . montelukast (SINGULAIR) 10 MG tablet Take 10 mg by mouth daily.        . Multiple Vitamins-Minerals (MULTIVITAL PO) Take 1 tablet by mouth daily.        . Omega-3 Fatty Acids (FISH OIL) 1000 MG CAPS Take 1 capsule by mouth daily.        [name] PARoxetine (PAXIL) 30 MG tablet Take 60 mg by mouth at bedtime as needed.       . pramipexole (MIRAPEX) 0.125 MG tablet Take 1 tablet (0.125 mg total) by mouth at bedtime.  90 tablet  3  . primidone (MYSOLINE) 50 MG tablet 1/2 pill each bedtime x 2 weeks, then 1 pill  nightly x 2 weeks, then 1 1/2 pills nightly x 2 weeks, then 2 pills nightly thereafter.  180 tablet  0  . propranolol ER (INDERAL LA) 60 MG 24 hr capsule Take 1 capsule (60 mg total) by mouth daily.  90 capsule  3  . beclomethasone (QVAR) 40 MCG/ACT inhaler Inhale 2 puffs into the lungs 2 (two) times daily. Rinse mouth  1 Inhaler  2  . fluticasone (FLONASE) 50 MCG/ACT nasal spray Place 2 sprays into the nose daily as needed.      [name] NUVIGIL 250 MG tablet TAKE 1 TABLET BY MOUTH ONCE DAILY  90 tablet  2  . Testosterone 1.25 GM/ACT (1%) GEL Place 2 Squirts onto the skin daily.        No facility-administered medications prior to visit.    PAST MEDICAL HISTORY: Past Medical History  Diagnosis Date  . Arthritis   . Asthma   . Prostate cancer   . Colon polyps   . COPD (chronic obstructive pulmonary disease)   . OCD (obsessive compulsive disorder)   . Hypertriglyceridemia   . IBS (irritable bowel syndrome)   . Sleep apnea     PAST SURGICAL HISTORY: Past Surgical History  Procedure Laterality Date  . Prostatectomy    . Knee arthroscopy      left  . Great toe arthrodesis, interphalangeal joint    . Finger surgery      left x 3  . Bladder surgery      extension  . Tonsillectomy      FAMILY HISTORY: Family History  Problem Relation Age of Onset  . Breast cancer Maternal Grandmother   . Colon cancer Maternal Grandfather   . Colon polyps Mother     son    SOCIAL HISTORY: History   Social History  . Marital Status: Married    Spouse Name: Denice Paradise    Number of Children: 3  . Years of Education: BA   Occupational History  . pastor   . Retired    Social History Main Topics  . Smoking status: Former Smoker    Types: Cigarettes    Quit date: 09/26/1970  . Smokeless tobacco: Never Used  . Alcohol Use: No  . Drug Use: No  . Sexual Activity: Not on file   Other Topics Concern  . Not on file   Social History Narrative   Patient lives at home with spouse.   Caffeine Use:  2-3 cups daily     PHYSICAL EXAM  Filed Vitals:   11/28/13 1414  BP: 125/82  Pulse: 75  Height: 5' 11.5" (1.816 m)  Weight: 240 lb (108.863 kg)   Body mass index is 33.01 kg/(m^2).  Generalized: Well developed, obese male in no acute distress  Head: normocephalic and atraumatic,. Oropharynx benign  Neck: Supple, no carotid bruits  Cardiac: Regular rate rhythm, no murmur  Musculoskeletal: No deformity   Neurological examination   Mentation: Alert oriented to time, place, history taking. Follows all commands speech and language fluent  Cranial nerve II-XII: Pupils were equal round reactive to light extraocular movements were full, visual field were full on confrontational test. Facial sensation and strength were normal. hearing was intact to finger rubbing bilaterally. Uvula tongue midline. head turning and shoulder shrug were normal and symmetric.Tongue protrusion into cheek strength was normal. Motor: normal bulk and tone, full strength in the BUE, BLE, . No focal weakness. Outstretched tremor left greater than right.  Coordination: finger-nose-finger, heel-to-shin bilaterally, no dysmetria Reflexes: Brachioradialis 2/2, biceps 2/2, triceps 2/2, patellar 2/2, Achilles 2/2, plantar responses were flexor bilaterally. Gait and Station: Rising up from seated position without assistance, normal stance,  moderate stride, good arm swing, smooth turning, able to perform tiptoe, and heel walking without difficulty. Tandem gait is steady  DIAGNOSTIC DATA (LABS, IMAGING, TESTING)   ASSESSMENT AND PLAN  67 y.o. year old male  has a past medical history of essential tremor affecting both upper extremities left more than right and a history of restless leg syndrome. He is having side effects to this today and wants to discontinue this medication. He will remain on his propanolol  1/2 of Primidone nightly for 4 days then discontinue Weighted utensils for eating Oversized pen for writing Has  low dose Xanax for prn use from PCP F/U 6 months Daniel Terrell, [facility], [facility], APRN  [facility] [address], [place], [place] [address] [phone]  I reviewed the above note and documentation by the Nurse Practitioner and agree with the history, physical exam, assessment and plan as outlined above.

## 2013-11-28 NOTE — Patient Instructions (Signed)
1/2 of Primidone nightly for 4 days then discontinue Weighted utensils for eating Oversized pen for writing F/U 6 months

## 2014-01-07 ENCOUNTER — Other Ambulatory Visit: Payer: Self-pay | Admitting: Neurology

## 2014-01-07 NOTE — Telephone Encounter (Signed)
OV from last month says: 1/2 of Primidone nightly for 4 days then discontinue

## 2014-02-21 ENCOUNTER — Encounter: Payer: Self-pay | Admitting: Nurse Practitioner

## 2014-02-21 ENCOUNTER — Telehealth: Payer: Self-pay | Admitting: Nurse Practitioner

## 2014-02-21 NOTE — Telephone Encounter (Signed)
Left message for patient regarding rescheduling 06/04/14 appointment per Carolyn's schedule. Printed and sent letter with new appointment time.

## 2014-05-06 ENCOUNTER — Other Ambulatory Visit: Payer: Self-pay | Admitting: Urology

## 2014-05-08 ENCOUNTER — Ambulatory Visit: Payer: Medicare Other | Admitting: Internal Medicine

## 2014-05-28 ENCOUNTER — Encounter: Payer: Self-pay | Admitting: Gastroenterology

## 2014-06-03 ENCOUNTER — Encounter: Payer: Self-pay | Admitting: Internal Medicine

## 2014-06-03 ENCOUNTER — Ambulatory Visit (INDEPENDENT_AMBULATORY_CARE_PROVIDER_SITE_OTHER): Payer: Medicare HMO | Admitting: Internal Medicine

## 2014-06-03 ENCOUNTER — Ambulatory Visit (INDEPENDENT_AMBULATORY_CARE_PROVIDER_SITE_OTHER)
Admission: RE | Admit: 2014-06-03 | Discharge: 2014-06-03 | Disposition: A | Discharge status: Home / Self Care | Payer: Medicare HMO | Source: Ambulatory Visit | Attending: Internal Medicine | Admitting: Internal Medicine

## 2014-06-03 VITALS — BP 110/80 | HR 81 | Ht 71.0 in | Wt 221.0 lb

## 2014-06-03 DIAGNOSIS — G4752 REM sleep behavior disorder: Secondary | ICD-10-CM

## 2014-06-03 DIAGNOSIS — J438 Other emphysema: Secondary | ICD-10-CM

## 2014-06-03 DIAGNOSIS — Z23 Encounter for immunization: Secondary | ICD-10-CM

## 2014-06-03 DIAGNOSIS — G4733 Obstructive sleep apnea (adult) (pediatric): Secondary | ICD-10-CM

## 2014-06-03 DIAGNOSIS — J42 Unspecified chronic bronchitis: Secondary | ICD-10-CM

## 2014-06-03 HISTORY — DX: REM sleep behavior disorder: G47.52

## 2014-06-03 MED ORDER — AEROCHAMBER PLUS MISC: Status: DC

## 2014-06-03 MED ORDER — AMPHETAMINE-DEXTROAMPHET ER 20 MG PO CP24: 20.0000 mg | ORAL_CAPSULE | Freq: Every day | ORAL | Status: DC

## 2014-06-03 MED ORDER — CLONAZEPAM 0.5 MG PO TABS: ORAL_TABLET | ORAL | Status: DC

## 2014-06-03 NOTE — Assessment & Plan Note (Signed)
He and wife give good description. Neuro w/u for tremor in past did not feel he had Parkinson's. Plan- try clonazepam. Education done

## 2014-06-03 NOTE — Assessment & Plan Note (Addendum)
Plan- ONOX on room air with oral appliance, flu vax, CXR. Add Aerochamber spacer and try to resume use of Qvar as directed. His wife can help encourage use. Discussed maintenance vs rescue meds.

## 2014-06-03 NOTE — Patient Instructions (Addendum)
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

## 2014-06-03 NOTE — Assessment & Plan Note (Addendum)
Sounds like oral appliance is better than nothing, but giving incomplete control. He is satisfied to continue working with Dr Ron Parker on this. Offer trial of Adderall, if more affordable than provigil for occasional use

## 2014-06-03 NOTE — Progress Notes (Signed)
Subjective:    Patient ID: Daniel Terrell, male    DOB: Jul 30, 1946, 68 y.o.   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 Having to sleep sitting up for 3 weeks at night. Denies fever, sore throat or malaise.  Expects sinus infection in November. In the Spring his PCP gave steroid shot and antibiotic for sinusitis- helped on 2 occasions.  Has been taking singulair, Beconase AQ, cetirizine. 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 He expresses interest in tramadol for cough- helps his wife.   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 He states that he has had one sinus infection in the past week-treated with Doxy and Kenolog injection. pt c/o PND left over from sinus infection. Onset was after trip to [place] with climate changes and airplane flights. Still has morning drainage and is sleeping in a chair. Chest/bronchitis bothers more than rhinitis. Using Flonase, not Nasonex or Patanase. Nuvigil continues to be a big help with daytime sleepiness. Sleep hygiene reviewed.  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  Machine is noisy and sweats alot on head with mask on.  Occas wheezing and occas cough - prod at times.  No SOB, chest tightness, or chest pain. Complains CPAP nasal pillows mask face sweaty. Stopped using CPAP 7 or 8 months ago. Nasal congestion better control with Singulair. Occasional cough without wheeze if works outdoors.  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 Had failed CPAP. Donut hole- can't afford provigil/ Nuvigil Says Mouthpiece has reduced AHI from 50 to 20/ hr. Had some TMJ pain. Wife says he still snores. Aware of some EDS.  2 episodes of bronchitis in spring/ summer, treated with antibiotics, injections. Has not been using inhalers. Daily cough, wheeze, DOE. They describe episodes during dreams when he punches his wife- kicking and striking. No hx Parkinson's.  Review of Systems- see HPI Constitutional:   No-   weight loss, night sweats, fevers, chills, fatigue, lassitude. HEENT:   No-  headaches, difficulty swallowing, tooth/dental problems, sore throat,       + sneezing, itching, ear ache, + nasal congestion, post nasal drip,  CV:  No-   chest pain, orthopnea, PND, swelling in lower extremities, anasarca, dizziness, palpitations Resp: +shortness of breath with exertion or at rest.              Per HPI;  No-  coughing up of blood.              No-   change in color of mucus.  + wheezing.   Skin: No-   rash or lesions. GI:  No-   heartburn, indigestion, abdominal pain, nausea, vomiting,  GU:  MS:  No-   joint pain or swelling.   Neuro- nothing unusual Psych:  No- change in mood or affect. No depression or anxiety.  No memory loss. Objective:   Physical  Exam General- Alert, Oriented, Affect-appropriate, Distress- none acute   overweight Skin- rash-none, lesions- none, excoriation- none Lymphadenopathy- none Head- atraumatic            Eyes- Gross vision intact, PERRLA, conjunctivae clear secretions            Ears- Hearing, canals normal            Nose- +sniffing but clear , No- Septal dev, mucus, polyps, erosion, perforation             Throat- + S/P UPPP, mucosa clear , drainage- none, tonsils- atrophic Neck- flexible ,  trachea midline, no stridor , thyroid nl, carotid no bruit Chest - symmetrical excursion , unlabored           Heart/CV- RRR , no murmur , no gallop  , no rub, nl s1 s2                           - JVD- none , edema- none, stasis changes- none, varices- none           Lung- clear to P&A, wheeze- none, cough- none , dullness-none, rub- none   Not coughing at all while here           Chest wall-  Abd-  Br/ Gen/ Rectal- Not done, not indicated Extrem- cyanosis- none, clubbing, none, atrophy- none, strength- nl Neuro- grossly intact to observation Assessment & Plan:

## 2014-06-04 ENCOUNTER — Ambulatory Visit: Payer: Medicare HMO | Admitting: Nurse Practitioner

## 2014-06-05 ENCOUNTER — Encounter (HOSPITAL_COMMUNITY): Payer: Self-pay

## 2014-06-05 ENCOUNTER — Encounter (HOSPITAL_COMMUNITY)
Admission: RE | Admit: 2014-06-05 | Discharge: 2014-06-05 | Disposition: A | Discharge status: Home / Self Care | Payer: Medicare HMO | Source: Ambulatory Visit | Attending: Urology | Admitting: Urology

## 2014-06-05 DIAGNOSIS — T387X5A Adverse effect of androgens and anabolic congeners, initial encounter: Secondary | ICD-10-CM | POA: Insufficient documentation

## 2014-06-05 DIAGNOSIS — D751 Secondary polycythemia: Secondary | ICD-10-CM | POA: Diagnosis not present

## 2014-06-05 NOTE — Procedures (Addendum)
[name] presents today for phlebotomy per MD orders. HGB/HCT: no labs ordered  Phlebotomy procedure started at 0932 and ended at 0949. 500 cc removed. Patient tolerated procedure well. Stayed for 15 minute observation. Instructed to call Dr. Cy Blamer office for concerns. Verbalizes understanding.  IV needle removed intact.

## 2014-06-05 NOTE — Discharge Instructions (Signed)

## 2014-06-11 ENCOUNTER — Ambulatory Visit (INDEPENDENT_AMBULATORY_CARE_PROVIDER_SITE_OTHER): Payer: Medicare HMO | Admitting: Nurse Practitioner

## 2014-06-11 ENCOUNTER — Encounter: Payer: Self-pay | Admitting: Nurse Practitioner

## 2014-06-11 VITALS — BP 130/86 | HR 78 | Ht 70.0 in | Wt 220.8 lb

## 2014-06-11 DIAGNOSIS — G252 Other specified forms of tremor: Principal | ICD-10-CM

## 2014-06-11 DIAGNOSIS — G250 Essential tremor: Secondary | ICD-10-CM

## 2014-06-11 DIAGNOSIS — G2581 Restless legs syndrome: Secondary | ICD-10-CM

## 2014-06-11 MED ORDER — PROPRANOLOL HCL ER 60 MG PO CP24: 60.0000 mg | ORAL_CAPSULE | Freq: Every day | ORAL | Status: DC

## 2014-06-11 NOTE — Progress Notes (Addendum)
[facility]  PATIENT: Daniel Terrell DOB: 1946-03-20   REASON FOR VISIT: for essential tremor and restless legs  HISTORY OF PRESENT ILLNESS: [name], 68 year old male returns for followup. He was last seen in this office 11/28/2013. He has a history of essential tremor since his 54s.There is a family history of tremor. He also has a history of restless leg syndrome. Primidone was added to his treatment regimen but  caused erectile dysfunction and he  weaned off of the medication after his last visit. He continues on his long-acting propanolol. He has been placed on Klonopin by his primary care he claims this has helped his restless legs. He has obtained weighted utensils for eating and claims this has helped tremendously. He returns for reevaluation  HISTORY: essential tremor. He previously saw Dr. Morene Antu and was last seen by him on 11/13/2012 at which time he was switched to long-acting propranolol. Dr. Erling Cruz was hesitant to put him on primidone in view of his memory loss. His last MMSE was 27. He also has an underlying medical history of asthma so Dr. Erling Cruz was hesitant to increase his propranolol and dose. There is a family history of kidney stones but no personal history of kidney stones and he was also hesitant to try him on Topamax because of this as well as the cognitive side effects that can produce. The patient has an underlying medical history of prostate cancer, status post surgery in January 2009, Sleep apnea on CPAP, asthma, OCD, restless leg syndrome, and memory loss.  The tremor has gradually been progressive. 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 He has difficulty with other activities such as playing the guitar. He was started on Xanax in 2012 by his primary care physician and was started on propranolol 20 mg 3 times a day by Dr. love this was decreased to 20 mg daily. He had fatigue. He is now on Mirapex for RLS.  [name] He has seen Dr. Annamaria Boots for his sleep apnea and is currently not using the CPAP. He has had memory loss since his prostate surgery.  He feels, that going on long acting Propranolol has helped, but he still has a tremor. It has affected his hands, but not his neck or jaw or voice. His RLS is under control with Mirapex 0.125 mg strength one pill at night only. He has actually not taken it twice daily.   REVIEW OF SYSTEMS: Full 14 system review of systems performed and notable only for those listed, all others are neg:  Constitutional: N/A  Cardiovascular: N/A  Ear/Nose/Throat: N/A  Skin: N/A  Eyes: N/A  Respiratory: Recent bronchitis Gastroitestinal: N/A  Hematology/Lymphatic: N/A  Endocrine: Intolerance to heat Musculoskeletal:N/A  Allergy/Immunology: N/A  Neurological: Memory loss since prostate surgery, tremors Psychiatric: N/A Sleep : Obstructive sleep apnea, REM sleep disorder(Dr. Annamaria Boots)  ALLERGIES: Allergies  Allergen Reactions  . Amoxicillin   . Avelox [Moxifloxacin]   . Levofloxacin     REACTION: disorientation   . Ofloxacin     REACTION: disorientation      HOME MEDICATIONS: Outpatient Prescriptions Prior to Visit  Medication Sig Dispense Refill  . albuterol (PROVENTIL HFA;VENTOLIN HFA) 108 (90 BASE) MCG/ACT inhaler Inhale 1-2 puffs into the lungs every 6 (six) hours as needed for wheezing or shortness of breath.      . ALPRAZolam (XANAX) 0.5 MG tablet Take 0.5 mg by mouth as needed.        [name]  aspirin 81 MG tablet Take 81 mg by mouth daily.        . cetirizine (ZYRTEC) 10 MG tablet Take 10 mg by mouth daily.        . Choline Fenofibrate (FENOFIBRIC ACID) 135 MG CPDR Take 1 capsule by mouth daily.       . clonazePAM (KLONOPIN) 0.5 MG tablet 1 or 2 at bedtime as directed  30 tablet  0  . glucosamine-chondroitin 500-400 MG tablet Take 1 tablet by mouth 2 (two) times daily.        . montelukast (SINGULAIR) 10 MG tablet Take 10 mg by mouth daily.        . Multiple  Vitamins-Minerals (MULTIVITAL PO) Take 1 tablet by mouth daily.        [name] PARoxetine (PAXIL) 30 MG tablet Take 60 mg by mouth daily.       . propranolol ER (INDERAL LA) 60 MG 24 hr capsule Take 1 capsule (60 mg total) by mouth daily.  90 capsule  3  . Spacer/Aero-Holding Chambers (AEROCHAMBER PLUS) inhaler Use as instructed  1 each  2  . beclomethasone (QVAR) 40 MCG/ACT inhaler Inhale 2 puffs into the lungs 2 (two) times daily. Rinse mouth  1 Inhaler  2  . fluticasone (FLONASE) 50 MCG/ACT nasal spray Place 2 sprays into the nose daily as needed.      . testosterone cypionate (DEPOTESTOTERONE CYPIONATE) 200 MG/ML injection Inject 200 mg into the muscle.       . amphetamine-dextroamphetamine (ADDERALL XR) 20 MG 24 hr capsule Take 1 capsule (20 mg total) by mouth daily.  30 capsule  0  . tiotropium (SPIRIVA) 18 MCG inhalation capsule Place 18 mcg into inhaler and inhale daily as needed.       No facility-administered medications prior to visit.    PAST MEDICAL HISTORY: Past Medical History  Diagnosis Date  . Arthritis   . Asthma   . Prostate cancer   . Colon polyps   . COPD (chronic obstructive pulmonary disease)   . OCD (obsessive compulsive disorder)   . Hypertriglyceridemia   . IBS (irritable bowel syndrome)   . Sleep apnea     PAST SURGICAL HISTORY: Past Surgical History  Procedure Laterality Date  . Prostatectomy    . Knee arthroscopy      left  . Great toe arthrodesis, interphalangeal joint    . Finger surgery      left x 3  . Bladder surgery      extension  . Tonsillectomy      FAMILY HISTORY: Family History  Problem Relation Age of Onset  . Breast cancer Maternal Grandmother   . Colon cancer Maternal Grandfather   . Colon polyps Mother     son    SOCIAL HISTORY: History   Social History  . Marital Status: Married    Spouse Name: Daniel Terrell    Number of Children: 3  . Years of Education: BA   Occupational History  . pastor   . Retired    Social History Main  Topics  . Smoking status: Former Smoker    Types: Cigarettes    Quit date: 09/26/1970  . Smokeless tobacco: Never Used  . Alcohol Use: No  . Drug Use: No  . Sexual Activity: Not on file   Other Topics Concern  . Not on file   Social History Narrative   Patient is married with 3 children.   Patient is right handed.   Patient has a Water quality scientist  degree.   Patient drinks 2-3 cups daily.           PHYSICAL EXAM  Filed Vitals:   06/11/14 1516  BP: 130/86  Pulse: 78  Height: 5\' 10"  (1.778 m)  Weight: 220 lb 12.8 oz (100.154 kg)   Body mass index is 31.68 kg/(m^2). Generalized: Well developed, obese male in no acute distress  Head: normocephalic and atraumatic,. Oropharynx benign  Neck: Supple, no carotid bruits  Musculoskeletal: No deformity  Neurological examination  Mentation: Alert oriented to time, place, history taking. Follows all commands speech and language fluent  Cranial nerve II-XII: Pupils were equal round reactive to light extraocular movements were full, visual field were full on confrontational test. Facial sensation and strength were normal. hearing was intact to finger rubbing bilaterally. Uvula tongue midline. head turning and shoulder shrug were normal and symmetric.Tongue protrusion into cheek strength was normal.  Motor: normal bulk and tone, full strength in the BUE, BLE, . No focal weakness. Outstretched tremor left greater than right.  Coordination: finger-nose-finger, heel-to-shin bilaterally, no dysmetria  Reflexes: Brachioradialis 2/2, biceps 2/2, triceps 2/2, patellar 2/2, Achilles 2/2, plantar responses were flexor bilaterally.  Gait and Station: Rising up from seated position without assistance, normal stance, moderate stride, good arm swing, smooth turning, able to perform tiptoe, and heel walking without difficulty. Tandem gait is steady   DIAGNOSTIC DATA      ASSESSMENT AND PLAN  68 y.o. year old male  has a past medical history of essential  tremor, arthritis; Asthma; Prostate cancer;  COPD (chronic obstructive pulmonary disease); OCD (obsessive compulsive disorder); Hypertriglyceridemia; IBS (irritable bowel syndrome); and Sleep apnea. here to followup.  Continue propanolol will renew for one year Followup yearly and when necessary Dennie Bible, [facility], [facility], APRN  [facility] [address], [place], [place] [address] [phone]  I reviewed the above note and documentation by the Nurse Practitioner and agree with the history, physical exam, assessment and plan as outlined above. Star Age, MD, PhD [facility])

## 2014-06-11 NOTE — Patient Instructions (Signed)
Continue propanolol will renew for one year Followup yearly and when necessary

## 2014-06-23 ENCOUNTER — Telehealth: Payer: Self-pay | Admitting: Internal Medicine

## 2014-06-23 MED ORDER — CLONAZEPAM 0.5 MG PO TABS: ORAL_TABLET | ORAL | Status: DC

## 2014-06-23 NOTE — Telephone Encounter (Signed)
Ok to refill clonazepam for 90 days, ref x 3

## 2014-06-23 NOTE — Telephone Encounter (Signed)
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

## 2014-06-23 NOTE — Telephone Encounter (Signed)
Rx called into [facility]-- clonazepam for 90 days, ref x 3 Pt aware.  Nothing further needed.

## 2014-06-27 ENCOUNTER — Telehealth: Payer: Self-pay | Admitting: Internal Medicine

## 2014-06-27 DIAGNOSIS — J42 Unspecified chronic bronchitis: Secondary | ICD-10-CM

## 2014-06-27 NOTE — Telephone Encounter (Signed)
Order placed to [facility].

## 2014-08-25 ENCOUNTER — Other Ambulatory Visit: Payer: Self-pay | Admitting: Urology

## 2014-09-02 ENCOUNTER — Ambulatory Visit (INDEPENDENT_AMBULATORY_CARE_PROVIDER_SITE_OTHER): Payer: Medicare HMO | Admitting: Internal Medicine

## 2014-09-02 ENCOUNTER — Ambulatory Visit: Payer: Medicare HMO | Admitting: Internal Medicine

## 2014-09-02 ENCOUNTER — Encounter: Payer: Self-pay | Admitting: Internal Medicine

## 2014-09-02 VITALS — BP 134/70 | HR 80 | Ht 71.0 in | Wt 228.0 lb

## 2014-09-02 DIAGNOSIS — G4733 Obstructive sleep apnea (adult) (pediatric): Secondary | ICD-10-CM

## 2014-09-02 MED ORDER — AMPHETAMINE-DEXTROAMPHET ER 20 MG PO CP24: 20.0000 mg | ORAL_CAPSULE | ORAL | Status: DC | PRN

## 2014-09-02 MED ORDER — BECLOMETHASONE DIPROPIONATE 40 MCG/ACT IN AERS: 2.0000 | INHALATION_SPRAY | Freq: Two times a day (BID) | RESPIRATORY_TRACT | Status: DC

## 2014-09-02 NOTE — Progress Notes (Signed)
Subjective:    Patient ID: Daniel Terrell, male    DOB: [birth year], 68 y.o.   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 Having to sleep sitting up for 3 weeks at night. Denies fever, sore throat or malaise.  Expects sinus infection in November. In the Spring his PCP gave steroid shot and antibiotic for sinusitis- helped on 2 occasions.  Has been taking singulair, Beconase AQ, cetirizine. 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 He expresses interest in tramadol for cough- helps his wife.   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 He states that he has had one sinus infection in the past week-treated with Doxy and Kenolog injection. pt c/o PND left over from sinus infection. Onset was after trip to [place] with climate changes and airplane flights. Still has morning drainage and is sleeping in a chair. Chest/bronchitis bothers more than rhinitis. Using Flonase, not Nasonex or Patanase. Nuvigil continues to be a big help with daytime sleepiness. Sleep hygiene reviewed.  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  Machine is noisy and sweats alot on head with mask on.  Occas wheezing and occas cough - prod at times.  No SOB, chest tightness, or chest pain. Complains CPAP nasal pillows mask face sweaty. Stopped using CPAP 7 or 8 months ago. Nasal congestion better control with Singulair. Occasional cough without wheeze if works outdoors.  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 Had failed CPAP. Donut hole- can't afford provigil/ Nuvigil Says Mouthpiece has reduced AHI from 50 to 20/ hr. Had some TMJ pain. Wife says he still snores. Aware of some EDS.  2 episodes of bronchitis in spring/ summer, treated with antibiotics, injections. Has not been using inhalers. Daily cough, wheeze, DOE. They describe episodes during dreams when he punches his wife- kicking and striking. No hx Parkinson's.  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 Ron Parker later today to adjust it.  No other problems.    CXR 06/03/14- FINDINGS: Two views of the chest demonstrate clear lungs. Heart and mediastinum are within normal limits. The trachea is midline. No acute bone abnormality. IMPRESSION: No active cardiopulmonary disease.   Electronically Signed  By: Markus Daft M.D.  On: 06/03/2014 13:51  Review of Systems- see HPI Constitutional:   No-   weight loss, night sweats, fevers, chills, fatigue, lassitude. HEENT:   No-  headaches, difficulty swallowing, tooth/dental problems, sore throat,       + sneezing, itching, ear ache, + nasal congestion, post nasal drip,  CV:  No-   chest pain, orthopnea, PND, swelling in lower extremities, anasarca, dizziness, palpitations Resp: +shortness of breath with exertion or at rest.  Per HPI;  No-  coughing up of blood.              No-   change in color of mucus.  + wheezing.   Skin: No-   rash or lesions. GI:  No-   heartburn, indigestion, abdominal pain, nausea, vomiting,  GU:  MS:  No-   joint pain or swelling.   Neuro- nothing unusual Psych:  No- change in mood or affect. No  depression or anxiety.  No memory loss. Objective:   Physical Exam General- Alert, Oriented, Affect-appropriate, Distress- none acute   overweight Skin- rash-none, lesions- none, excoriation- none Lymphadenopathy- none Head- atraumatic            Eyes- Gross vision intact, PERRLA, conjunctivae clear secretions            Ears- Hearing, canals normal            Nose- +sniffing but clear , No- Septal dev, mucus, polyps, erosion, perforation             Throat- + S/P UPPP, mucosa clear , drainage- none, tonsils- atrophic Neck- flexible , trachea midline, no stridor , thyroid nl, carotid no bruit Chest - symmetrical excursion , unlabored           Heart/CV- RRR , no murmur , no gallop  , no rub, nl s1 s2                           - JVD- none , edema- none, stasis changes- none, varices- none           Lung- clear to P&A, wheeze- none, cough- none , dullness-none, rub- none   Not coughing at all while here           Chest wall-  Abd-  Br/ Gen/ Rectal- Not done, not indicated Extrem- cyanosis- none, clubbing, none, atrophy- none, strength- nl Neuro- grossly intact to observation Assessment & Plan:

## 2014-09-02 NOTE — Patient Instructions (Signed)
Script sent refilling Qvar  Script printed refilling Adderall for occasional use if needed  Please call if we can help

## 2014-09-04 ENCOUNTER — Encounter (HOSPITAL_COMMUNITY): Payer: Self-pay

## 2014-09-04 ENCOUNTER — Encounter (HOSPITAL_COMMUNITY)
Admission: RE | Admit: 2014-09-04 | Discharge: 2014-09-04 | Disposition: A | Discharge status: Home / Self Care | Payer: Medicare HMO | Source: Ambulatory Visit | Attending: Urology | Admitting: Urology

## 2014-09-04 DIAGNOSIS — D751 Secondary polycythemia: Secondary | ICD-10-CM | POA: Diagnosis present

## 2014-09-04 DIAGNOSIS — T387X5A Adverse effect of androgens and anabolic congeners, initial encounter: Secondary | ICD-10-CM | POA: Diagnosis not present

## 2014-09-04 NOTE — Procedures (Deleted)
No note

## 2014-09-04 NOTE — Procedures (Signed)
Therapeutic Phelbotomy performed, no pre-labs orders, Procedure started at 1113, completed at 1129,  total of 555ml of blood removed per order, pt. Tolerated procedure well.

## 2014-12-04 ENCOUNTER — Telehealth: Payer: Self-pay | Admitting: Internal Medicine

## 2014-12-04 DIAGNOSIS — J42 Unspecified chronic bronchitis: Secondary | ICD-10-CM

## 2014-12-04 NOTE — Telephone Encounter (Signed)
Spouse aware order placed. Nothing further needed

## 2014-12-04 NOTE — Telephone Encounter (Signed)
Ok to [facility] DME to [facility]                        Order Room air [place]  For dx chronic bronchitis

## 2014-12-04 NOTE — Telephone Encounter (Signed)
Spoke with pt's wife, Pricilla Holm. [facility] is no longer in network with their insurance. [facility] is and they would like to switch to them. Pt only uses 2L of oxygen at night. He will need ONO on RA when switching DME.  CY - please advise. Thanks.

## 2014-12-05 ENCOUNTER — Telehealth: Payer: Self-pay | Admitting: Internal Medicine

## 2014-12-05 DIAGNOSIS — J42 Unspecified chronic bronchitis: Secondary | ICD-10-CM

## 2014-12-05 NOTE — Telephone Encounter (Signed)
Call Documentation      Inge Rise, [place] at 12/04/2014 5:16 PM     Status: Signed       Expand All Collapse All   Spouse aware order placed. Nothing further needed            Deneise Lever, MD at 12/04/2014 4:43 PM     Status: Signed       Expand All Collapse All   Ok to [facility] to [facility]  Order Room air [place] For dx chronic bronchitis            Randa Spike, CMA at 12/04/2014 2:31 PM     Status: Signed       Expand All Collapse All   Spoke with pt's wife, Pricilla Holm. [facility] is no longer in network with their insurance. [facility] is and they would like to switch to them. Pt only uses 2L of oxygen at night. He will need ONO on RA when switching DME.  CY - please advise. Thanks.       Pt only uses O2 at night.

## 2014-12-05 NOTE — Telephone Encounter (Signed)
Spoke with Melissa. ONO has not been scheduled yet, will need to send O2 order after ONO has been done.    To Katie to follow up.

## 2014-12-05 NOTE — Telephone Encounter (Signed)
Daniel Terrell requesting order for O2.  There was an order placed for ONO testing, but not for O2.  Patient uses 2L o2 per Dr. Annamaria Boots, however, I do not see anything in the chart showing his 02 Sats, so I cannot order the O2 without the O2 Sats.  Were O2 Sats done on this patient.  To Katie to follow up.

## 2014-12-10 NOTE — Telephone Encounter (Signed)
Staff message sent to [facility] with [facility] to see when or if the patient has been set up for ONO as of today.

## 2014-12-10 NOTE — Telephone Encounter (Signed)
[place], [place]. Found out that the ONO was delivered today and is to be picked up tomorrow afternoon. You should have results from [place] by the end of the week.  Thanks!

## 2014-12-10 NOTE — Telephone Encounter (Signed)
Katie please advise on any updates with this.  Thanks!

## 2014-12-11 ENCOUNTER — Encounter (HOSPITAL_COMMUNITY): Payer: Self-pay

## 2014-12-11 ENCOUNTER — Encounter (HOSPITAL_COMMUNITY)
Admission: RE | Admit: 2014-12-11 | Discharge: 2014-12-11 | Disposition: A | Discharge status: Home / Self Care | Payer: PPO | Source: Ambulatory Visit | Attending: Urology | Admitting: Urology

## 2014-12-11 DIAGNOSIS — D751 Secondary polycythemia: Secondary | ICD-10-CM | POA: Diagnosis present

## 2014-12-11 DIAGNOSIS — T387X5A Adverse effect of androgens and anabolic congeners, initial encounter: Secondary | ICD-10-CM | POA: Diagnosis not present

## 2014-12-11 NOTE — Discharge Instructions (Signed)
Therapeutic Phlebotomy, Care After Refer to this sheet in the next few weeks. These instructions provide you with information on caring for yourself after your procedure. Your caregiver may also give you more specific instructions. Your treatment has been planned according to current medical practices, but problems sometimes occur. Call your caregiver if you have any problems or questions after your procedure. HOME CARE INSTRUCTIONS Most people can go back to their normal activities right away. Before you leave, be sure to ask if there is anything you should or should not do. In general, it would be wise to:  Keep the bandage dry. You can remove the bandage after about 5 hours.  Eat well-balanced meals for the next 24 hours.  Drink enough fluids to keep your urine clear or pale yellow.  Avoid drinking alcohol minimally until after eating.  Avoid smoking for at least 30 minutes after the procedure.  Avoid strenuous physical activity or heavy lifting or pulling for about 5 hours after the procedure.  Athletes should avoid strenuous exercise for 12 hours or more.  Change positions slowly for the remainder of the day to prevent light-headedness or fainting.  If you feel light-headed, lie down until the feeling subsides.  If you have bleeding from the needle insertion site, elevate your arm and press firmly on the site until the bleeding stops.  If bruising or bleeding appears under the skin, apply ice to the area for 15 to 20 minutes, 3 to 4 times per day. Put the ice in a plastic bag and place a towel between the bag of ice and your skin. Do this while you are awake for the first 24 hours. The ice packs can be stopped before 24 hours if the swelling goes away. If swelling persists after 24 hours, a warm, moist washcloth can be applied to the area for 15 to 20 minutes, 3 to 4 times per day. The warm, moist treatments can be stopped when the swelling goes away.  It is important to continue  further therapeutic phlebotomy as directed by your caregiver. SEEK MEDICAL CARE IF:  There is bleeding or fluid leaking from the needle insertion site.  The needle insertion site becomes swollen, red, or sore.  You feel light-headed, dizzy or nauseated, and the feeling does not go away.  You notice new bruising at the needle insertion site.  You feel more weak or tired than normal.  You develop a fever. SEEK IMMEDIATE MEDICAL CARE IF:   There is increased bleeding, pain, or swelling from the needle insertion site.  You have severe nausea or vomiting.  You have chest pain.  You have trouble breathing. MAKE SURE YOU:  Understand these instructions.  Will watch your condition.  Will get help right away if you are not doing well or get worse. Document Released: 02/14/2011 Document Revised: 01/27/2014 Document Reviewed: 02/14/2011 [facility] Patient Information 2015 [place], [place]. This information is not intended to replace advice given to you by your health care provider. Make sure you discuss any questions you have with your health care provider.  Therapeutic phlebotomy is the controlled removal of blood from your body for the purpose of treating a medical condition. It is similar to donating blood. Usually, about a pint (470 mL) of blood is removed. The average adult has 9 to 12 pints (4.3 to 5.7 L) of blood. Therapeutic phlebotomy may be used to treat the following medical conditions:  Hemochromatosis. This is a condition in which there is too much iron in  the blood.  Polycythemia vera. This is a condition in which there are too many red cells in the blood.  Porphyria cutanea tarda. This is a disease usually passed from one generation to the next (inherited). It is a condition in which an important part of hemoglobin is not made properly. This results in the build up of abnormal amounts of porphyrins in the body.  Sickle cell disease. This is an inherited disease. It is a  condition in which the red blood cells form an abnormal crescent shape rather than a round shape. LET YOUR CAREGIVER KNOW ABOUT:  Allergies.  Medicines taken including herbs, eyedrops, over-the-counter medicines, and creams.  Use of steroids (by mouth or creams).  Previous problems with anesthetics or numbing medicine.  History of blood clots.  History of bleeding or blood problems.  Previous surgery.  Possibility of pregnancy, if this applies. RISKS AND COMPLICATIONS This is a simple and safe procedure. Problems are unlikely. However, problems can occur and may include:  Nausea or lightheadedness.  Low blood pressure.  Soreness, bleeding, swelling, or bruising at the needle insertion site.  Infection. BEFORE THE PROCEDURE  This is a procedure that can be done as an outpatient. Confirm the time that you need to arrive for your procedure. Confirm whether there is a need to fast or withhold any medications. It is helpful to wear clothing with sleeves that can be raised above the elbow. A blood sample may be done to determine the amount of red blood cells or iron in your blood. Plan ahead of time to have someone drive you home after the procedure. PROCEDURE The entire procedure from preparation through recovery takes about 1 hour. The actual collection takes about 10 to 15 minutes.  A needle will be inserted into your vein.  Tubing and a collection bag will be attached to that needle.  Blood will flow through the needle and tubing into the collection bag.  You may be asked to open and close your hand slowly and continuously during the entire collection.  Once the specified amount of blood has been removed from your body, the collection bag and tubing will be clamped.  The needle will be removed.  Pressure will be held on the site of the needle insertion to stop the bleeding. Then a bandage will be placed over the needle insertion site. AFTER THE PROCEDURE  Your recovery  will be assessed and monitored. If there are no problems, as an outpatient, you should be able to go home shortly after the procedure.  Document Released: 02/14/2011 Document Revised: 12/05/2011 Document Reviewed: 02/14/2011 [facility] Patient Information 2015 [place], [place]. This information is not intended to replace advice given to you by your health care provider. Make sure you discuss any questions you have with your health care provider.

## 2014-12-11 NOTE — Progress Notes (Signed)
Daniel Terrell presents today for phlebotomy per MD orders. HGB/HCT:no labs orderd Phlebotomy procedure started at 0958 and ended at1018 500 cc removed. Patient tolerated procedure well. IV needle removed intact.

## 2014-12-15 NOTE — Telephone Encounter (Signed)
Katie, please advise if you have received the ONO results. Thanks.

## 2014-12-15 NOTE — Telephone Encounter (Signed)
Sent Melissa with [facility] staff message to hand deliver results to me ASAP.

## 2014-12-16 NOTE — Telephone Encounter (Signed)
ONO hand-delivered by Lenna Sciara w/ [facility] Given to CY to review Will route message as well

## 2014-12-16 NOTE — Telephone Encounter (Signed)
Please order change DME to [facility] for O2 2L for sleep, dx chronic bronchitis Qualifying ONOX on room air is available from 12/10/14

## 2014-12-17 ENCOUNTER — Encounter (HOSPITAL_COMMUNITY): Payer: Self-pay

## 2014-12-17 ENCOUNTER — Telehealth: Payer: Self-pay | Admitting: Internal Medicine

## 2014-12-17 DIAGNOSIS — J42 Unspecified chronic bronchitis: Secondary | ICD-10-CM

## 2014-12-17 NOTE — Telephone Encounter (Signed)
Order already placed on 3/22. Nothing further needed.

## 2014-12-17 NOTE — Telephone Encounter (Signed)
Left message for Daniel Terrell to call back.  

## 2014-12-18 NOTE — Telephone Encounter (Signed)
lmtcb for pt.  

## 2014-12-18 NOTE — Telephone Encounter (Signed)
Spoke with Melissa. She needs a different order placed. This has been done. Nothing further was needed.

## 2014-12-18 NOTE — Telephone Encounter (Signed)
Daniel Terrell returned call [phone]

## 2015-03-04 ENCOUNTER — Ambulatory Visit (INDEPENDENT_AMBULATORY_CARE_PROVIDER_SITE_OTHER): Payer: PPO | Admitting: Internal Medicine

## 2015-03-04 ENCOUNTER — Encounter: Payer: Self-pay | Admitting: Internal Medicine

## 2015-03-04 VITALS — BP 122/84 | HR 88 | Ht 71.0 in | Wt 212.8 lb

## 2015-03-04 DIAGNOSIS — G2581 Restless legs syndrome: Secondary | ICD-10-CM

## 2015-03-04 DIAGNOSIS — G4733 Obstructive sleep apnea (adult) (pediatric): Secondary | ICD-10-CM | POA: Diagnosis not present

## 2015-03-04 DIAGNOSIS — G4752 REM sleep behavior disorder: Secondary | ICD-10-CM | POA: Diagnosis not present

## 2015-03-04 DIAGNOSIS — J42 Unspecified chronic bronchitis: Secondary | ICD-10-CM

## 2015-03-04 MED ORDER — AMPHETAMINE-DEXTROAMPHET ER 20 MG PO CP24: 20.0000 mg | ORAL_CAPSULE | ORAL | Status: DC | PRN

## 2015-03-04 NOTE — Patient Instructions (Signed)
You seem to be doing very well  Ok to continue the oral appliance from Dr Ron Parker and the Oxygen at 2L for sleep  Script printed to refill Adderall of use if needed  Please call if we can help

## 2015-03-04 NOTE — Progress Notes (Signed)
Subjective:    Patient ID: Daniel Terrell, male    DOB: [birth year], 69 y.o.   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 Having to sleep sitting up for 3 weeks at night. Denies fever, sore throat or malaise.  Expects sinus infection in November. In the Spring his PCP gave steroid shot and antibiotic for sinusitis- helped on 2 occasions.  Has been taking singulair, Beconase AQ, cetirizine. 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 He expresses interest in tramadol for cough- helps his wife.   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 He states that he has had one sinus infection in the past week-treated with Doxy and Kenolog injection. pt c/o PND left over from sinus infection. Onset was after trip to [place] with climate changes and airplane flights. Still has morning drainage and is sleeping in a chair. Chest/bronchitis bothers more than rhinitis. Using Flonase, not Nasonex or Patanase. Nuvigil continues to be a big help with daytime sleepiness. Sleep hygiene reviewed.  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  Machine is noisy and sweats alot on head with mask on.  Occas wheezing and occas cough - prod at times.  No SOB, chest tightness, or chest pain. Complains CPAP nasal pillows mask face sweaty. Stopped using CPAP 7 or 8 months ago. Nasal congestion better control with Singulair. Occasional cough without wheeze if works outdoors.  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 Had failed CPAP. Donut hole- can't afford provigil/ Nuvigil Says Mouthpiece has reduced AHI from 50 to 20/ hr. Had some TMJ pain. Wife says he still snores. Aware of some EDS.  2 episodes of bronchitis in spring/ summer, treated with antibiotics, injections. Has not been using inhalers. Daily cough, wheeze, DOE. They describe episodes during dreams when he punches his wife- kicking and striking. No hx Parkinson's.  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 Ron Parker later today to adjust it.  No other problems.  CXR 06/03/14- FINDINGS: Two views of the chest demonstrate clear lungs. Heart and mediastinum are within normal limits. The trachea is midline. No acute bone abnormality. IMPRESSION: No active cardiopulmonary disease. Electronically Signed  By: Markus Daft M.D.  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 is on O2 2 L nightly . DME: [facility]. pt. Is doing fairly well  no complaints. 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 Adderall 20 mg XRfor alertness as before. Clonazepam for sleep if needed  Review of Systems- see HPI Constitutional:   No-   weight loss, night sweats, fevers, chills, fatigue, lassitude.  HEENT:   No-  headaches, difficulty swallowing, tooth/dental problems, sore throat,       + sneezing, itching, ear ache, + nasal congestion, post nasal drip,  CV:  No-   chest pain, orthopnea, PND, swelling in lower  extremities, anasarca, dizziness, palpitations Resp: +shortness of breath with exertion or at rest.              Per HPI;  No-  coughing up of blood.              No-   change in color of mucus.  + wheezing.   Skin: No-   rash or lesions. GI:  No-   heartburn, indigestion, abdominal pain, nausea, vomiting,  GU:  MS:  No-   joint pain or swelling.   Neuro- nothing unusual Psych:  No- change in mood or affect. No depression or anxiety.  No memory loss. Objective:   Physical Exam General- Alert, Oriented, Affect-appropriate, Distress- none acute   overweight Skin- rash-none, lesions- none, excoriation- none Lymphadenopathy- none Head- atraumatic            Eyes- Gross vision intact, PERRLA, conjunctivae clear secretions            Ears- Hearing, canals normal            Nose- +sniffing but clear , No- Septal dev, mucus, polyps, erosion, perforation             Throat- + S/P UPPP, mucosa clear , drainage- none, tonsils- atrophic Neck- flexible , trachea midline, no stridor , thyroid nl, carotid no bruit Chest - symmetrical excursion , unlabored           Heart/CV- RRR , no murmur , no gallop  , no rub, nl s1 s2                           - JVD- none , edema- none, stasis changes- none, varices- none           Lung- clear to P&A, wheeze- none, cough- none , dullness-none, rub- none   Not coughing at all while here           Chest wall-  Abd-  Br/ Gen/ Rectal- Not done, not indicated Extrem- cyanosis- none, clubbing, none, atrophy- none, strength- nl Neuro- grossly intact to observation Assessment & Plan:

## 2015-03-06 ENCOUNTER — Telehealth: Payer: Self-pay | Admitting: *Deleted

## 2015-03-06 NOTE — Telephone Encounter (Signed)
Received PA request for Adderall ER 20 mg. PA has been inititaed thru [email or web address] [id number]

## 2015-03-09 NOTE — Telephone Encounter (Signed)
Yes, please try. See my notes- He has OSA, intolerant of CPAP and partly managed by an oral appliance from orthodontist Dr Ron Parker. Residual daytime somnolence has been managed by stimulant medication which has worked well and been well tolerated.

## 2015-03-09 NOTE — Telephone Encounter (Signed)
[name] ins [employer] has initiated an automatic appeal on patient's behalf. I spoke with rep Adele Barthel. I told her they did not ask for any additional clinical information in which we have. She says none was needed for the initial appeal.  Will continue to follow.

## 2015-03-09 NOTE — Telephone Encounter (Signed)
PA for Adderall ER 20 has been denied. Only covered for DX of  ADHD  Or Narcolepsy in which sleep study must be provided.  Do we want to start an appeal?

## 2015-03-10 NOTE — Telephone Encounter (Signed)
Spoke with Amy at [facility].  She wanted to know if patient has had a sleep study.  Reviewed chart and determined that patient has had sleep study and gave information to Amy to determine approval of medication.  Awaiting response.  FYI to [id number]

## 2015-03-10 NOTE — Telephone Encounter (Signed)
Adderall 20 mg ER has been approved 03/10/15-09/26/15 by [employer]  Patient and pharmacy will be notified.

## 2015-03-10 NOTE — Telephone Encounter (Signed)
Amy from [employer] called. She is needing more clinical information. Can be reached at [phone] for appeal.

## 2015-03-18 NOTE — Assessment & Plan Note (Signed)
Clonazepam has helped when needed

## 2015-03-18 NOTE — Assessment & Plan Note (Signed)
Continues regular use of oral appliance/Dr. Ron Parker with supplemental oxygen Adderall when needed for excessive daytime somnolence

## 2015-03-18 NOTE — Assessment & Plan Note (Signed)
Oxygen 2 L for sleep good control with little breakthrough wheeze or cough now

## 2015-03-18 NOTE — Assessment & Plan Note (Signed)
Less active now.

## 2015-04-08 ENCOUNTER — Telehealth: Payer: Self-pay | Admitting: Internal Medicine

## 2015-04-08 ENCOUNTER — Other Ambulatory Visit: Payer: Self-pay | Admitting: Urology

## 2015-04-08 DIAGNOSIS — E291 Testicular hypofunction: Secondary | ICD-10-CM

## 2015-04-08 DIAGNOSIS — J42 Unspecified chronic bronchitis: Secondary | ICD-10-CM

## 2015-04-08 MED ORDER — CLONAZEPAM 0.5 MG PO TABS: ORAL_TABLET | ORAL | Status: DC

## 2015-04-08 NOTE — Telephone Encounter (Signed)
duplicate message. See prior phone note 04/08/15

## 2015-04-08 NOTE — Telephone Encounter (Signed)
Spoke w/ pt. He is wanting an order for POC sent to his DME. He only uses O2 at night but reports the concentrator is too large for him to carry when he travels and is loud. Please advise Dr. Annamaria Boots thanks

## 2015-04-09 NOTE — Telephone Encounter (Signed)
Ok to order through his DME- portable oxygen concentrator, 2L, needed for travel,  Dx asthma with chronic bronchitis, OSA

## 2015-04-09 NOTE — Telephone Encounter (Signed)
lmtcb x1 for pt. 

## 2015-04-10 NOTE — Telephone Encounter (Signed)
Spoke with pt. He is aware that we will send in this order. Nothing further was needed.

## 2015-05-01 ENCOUNTER — Other Ambulatory Visit: Payer: Self-pay | Admitting: Urology

## 2015-05-01 DIAGNOSIS — D751 Secondary polycythemia: Secondary | ICD-10-CM

## 2015-05-06 ENCOUNTER — Encounter (HOSPITAL_COMMUNITY)
Admission: RE | Admit: 2015-05-06 | Discharge: 2015-05-06 | Disposition: A | Discharge status: Home / Self Care | Payer: PPO | Source: Ambulatory Visit | Attending: Urology | Admitting: Urology

## 2015-05-06 ENCOUNTER — Encounter (HOSPITAL_COMMUNITY): Payer: Self-pay

## 2015-05-06 DIAGNOSIS — D751 Secondary polycythemia: Secondary | ICD-10-CM | POA: Diagnosis not present

## 2015-05-06 HISTORY — DX: Nausea with vomiting, unspecified: R11.2

## 2015-05-06 HISTORY — DX: Adverse effect of unspecified anesthetic, initial encounter: T41.45XA

## 2015-05-06 HISTORY — DX: Other specified postprocedural states: Z98.890

## 2015-05-06 HISTORY — DX: Other complications of anesthesia, initial encounter: T88.59XA

## 2015-05-06 NOTE — Discharge Instructions (Signed)

## 2015-05-06 NOTE — Progress Notes (Addendum)
Daniel Terrell presents today for phlebotomy per MD orders. HGB/HCT:done at Dr Cy Blamer office. MD ordered phlebotomy. Pt receiving testosterone shots which increases HGB. Phlebotomy procedure started at 1418 and ended at 1424 500 cc removed. Patient tolerated procedure well. IV needle removed intact. Given juice and crackers.

## 2015-07-17 ENCOUNTER — Telehealth: Payer: Self-pay | Admitting: Nurse Practitioner

## 2015-07-17 DIAGNOSIS — G250 Essential tremor: Secondary | ICD-10-CM

## 2015-07-17 MED ORDER — PROPRANOLOL HCL ER 60 MG PO CP24: 60.0000 mg | ORAL_CAPSULE | Freq: Every day | ORAL | Status: DC

## 2015-07-17 NOTE — Telephone Encounter (Signed)
One refill has been sent, noting annual appt is needed.  Receipt confirmed by pharmacy.

## 2015-07-17 NOTE — Telephone Encounter (Signed)
Pt needs refill on propranolol ER (INDERAL LA) 60 MG 24 hr capsule thank you

## 2015-07-23 ENCOUNTER — Ambulatory Visit (INDEPENDENT_AMBULATORY_CARE_PROVIDER_SITE_OTHER): Payer: PPO | Admitting: Nurse Practitioner

## 2015-07-23 ENCOUNTER — Encounter: Payer: Self-pay | Admitting: Nurse Practitioner

## 2015-07-23 VITALS — BP 125/90 | HR 82 | Ht 70.5 in | Wt 210.8 lb

## 2015-07-23 DIAGNOSIS — G2581 Restless legs syndrome: Secondary | ICD-10-CM

## 2015-07-23 DIAGNOSIS — G250 Essential tremor: Secondary | ICD-10-CM

## 2015-07-23 MED ORDER — PROPRANOLOL HCL ER 80 MG PO CP24: 80.0000 mg | ORAL_CAPSULE | Freq: Every day | ORAL | Status: DC

## 2015-07-23 NOTE — Patient Instructions (Signed)
Increase propanolol to 80 mg extended release daily, will refill for one month to see if it works and or increased side effects, patient to call back with refill Follow-up yearly and when necessary

## 2015-07-23 NOTE — Progress Notes (Addendum)
[facility]  PATIENT: Daniel Terrell DOB: [birth year]   REASON FOR VISIT: Essential tremor , restless leg syndrome HISTORY FROM: Patient    HISTORY OF PRESENT ILLNESS:Daniel Terrell, 69 year old male returns for followup. He was last seen in this office 06/11/14. He has a history of essential tremor since his 35s.There is a family history of tremor. He also has a history of restless leg syndrome. Primidone was added to his treatment regimen but caused erectile dysfunction and he weaned off of the medication several years ago.  He continues on his long-acting propanolol 60 mg . He has been placed on Klonopin by his primary care he claims this has helped his restless legs. He has obtained weighted utensils for eating and claims this has helped tremendously. He still has trouble playing the guitar. He is right handed and his tremor is worse in the left. He denies dropping things. He returns for reevaluation  HISTORY: essential tremor. He previously saw Daniel Terrell and was last seen by him on 11/13/2012 at which time he was switched to long-acting propranolol. Daniel Terrell was hesitant to put him on primidone in view of his memory loss. His last MMSE was 27. He also has an underlying medical history of asthma so Daniel Terrell was hesitant to increase his propranolol and dose. There is a family history of kidney stones but no personal history of kidney stones and he was also hesitant to try him on Topamax because of this as well as the cognitive side effects that can produce. The patient has an underlying medical history of prostate cancer, status post surgery in January 2009, Sleep apnea on CPAP, asthma, OCD, restless leg syndrome, and memory loss.  The tremor has gradually been progressive. 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 He has difficulty with other activities such as playing the guitar. He was started on Xanax in 2012 by his primary care  physician and was started on propranolol 20 mg 3 times a day by Daniel Terrell this was decreased to 20 mg daily. He had fatigue. He is now on Mirapex for RLS. [name] He has seen Daniel Terrell for his sleep apnea and is currently not using the CPAP. He has had memory loss since his prostate surgery.  He feels, that going on long acting Propranolol has helped, but he still has a tremor. It has affected his hands, but not his neck or jaw or voice. His RLS is under control with Mirapex 0.125 mg strength one pill at night only. He has actually not taken it twice daily.    REVIEW OF SYSTEMS: Full 14 system review of systems performed and notable only for those listed, all others are neg:  Constitutional: neg  Cardiovascular: neg Ear/Nose/Throat: neg  Skin: neg Eyes: neg Respiratory: neg Gastroitestinal: neg  Hematology/Lymphatic: neg  Endocrine: neg Musculoskeletal:neg Allergy/Immunology: neg Neurological: Essential tremor Psychiatric: neg Sleep : Obstructive sleep apnea, oxygen at night   ALLERGIES: Allergies  Allergen Reactions  . Amoxicillin   . Avelox [Moxifloxacin]   . Levofloxacin     REACTION: disorientation   . Ofloxacin     REACTION: disorientation      HOME MEDICATIONS: Outpatient Prescriptions Prior to Visit  Medication Sig Dispense Refill  . albuterol (PROVENTIL HFA;VENTOLIN HFA) 108 (90 BASE) MCG/ACT inhaler Inhale 1-2 puffs into the lungs every 6 (six) hours as needed for wheezing or shortness of breath.    . ALPRAZolam (XANAX) 0.5  MG tablet Take 0.5 mg by mouth as needed.      [name] amphetamine-dextroamphetamine (ADDERALL XR) 20 MG 24 hr capsule Take 1 capsule (20 mg total) by mouth as needed. 30 capsule 0  . aspirin 81 MG tablet Take 81 mg by mouth daily.      . beclomethasone (QVAR) 40 MCG/ACT inhaler Inhale 2 puffs into the lungs 2 (two) times daily. Rinse mouth 1 Inhaler prn  . cetirizine (ZYRTEC) 10 MG tablet Take 10 mg by mouth daily.      . Choline Fenofibrate (FENOFIBRIC  ACID) 135 MG CPDR Take 1 capsule by mouth daily.     . clonazePAM (KLONOPIN) 0.5 MG tablet 1 or 2 at bedtime as directed 90 tablet 3  . glucosamine-chondroitin 500-400 MG tablet Take 1 tablet by mouth 2 (two) times daily.      . montelukast (SINGULAIR) 10 MG tablet Take 10 mg by mouth daily.      . Multiple Vitamins-Minerals (MULTIVITAL PO) Take 1 tablet by mouth daily.      [name] PARoxetine (PAXIL) 30 MG tablet Take 60 mg by mouth daily.     . propranolol ER (INDERAL LA) 60 MG 24 hr capsule Take 1 capsule (60 mg total) by mouth daily. 30 capsule 0  . Spacer/Aero-Holding Chambers (AEROCHAMBER PLUS) inhaler Use as instructed 1 each 2  . testosterone cypionate (DEPOTESTOTERONE CYPIONATE) 200 MG/ML injection Inject 200 mg into the muscle every 21 ( twenty-one) days.     . fluticasone (FLONASE) 50 MCG/ACT nasal spray Place 2 sprays into the nose daily as needed.     No facility-administered medications prior to visit.    PAST MEDICAL HISTORY: Past Medical History  Diagnosis Date  . Arthritis   . Asthma   . Prostate cancer ([place])   . Colon polyps   . COPD (chronic obstructive pulmonary disease) ([place])   . OCD (obsessive compulsive disorder)   . Hypertriglyceridemia   . IBS (irritable bowel syndrome)   . Sleep apnea   . Complication of anesthesia   . PONV (postoperative nausea and vomiting)     PAST SURGICAL HISTORY: Past Surgical History  Procedure Laterality Date  . Prostatectomy    . Knee arthroscopy      left  . Great toe arthrodesis, interphalangeal joint    . Finger surgery      left x 3  . Bladder surgery      extension  . Tonsillectomy      FAMILY HISTORY: Family History  Problem Relation Age of Onset  . Breast cancer Maternal Grandmother   . Colon cancer Maternal Grandfather   . Colon polyps Mother     son    SOCIAL HISTORY: Social History   Social History  . Marital Status: Married    Spouse Name: Daniel Terrell  . Number of Children: 3  . Years of Education: BA    Occupational History  . pastor   . Retired    Social History Main Topics  . Smoking status: Former Smoker    Types: Cigarettes    Quit date: 09/26/1970  . Smokeless tobacco: Never Used  . Alcohol Use: No  . Drug Use: No  . Sexual Activity: Not on file   Other Topics Concern  . Not on file   Social History Narrative   Patient is married with 3 children.   Patient is right handed.   Patient has a Bachelor's degree.   Patient drinks 2-3 cups daily.  PHYSICAL EXAM  Filed Vitals:   07/23/15 0846  BP: 125/90  Pulse: 82  Height: 5' 10.5" (1.791 m)  Weight: 210 lb 12.8 oz (95.618 kg)   Body mass index is 29.81 kg/(m^2). Generalized: Well developed, obese male in no acute distress  Head: normocephalic and atraumatic,. Oropharynx benign  Neck: Supple, no carotid bruits  Musculoskeletal: No deformity  Neurological examination  Mentation: Alert oriented to time, place, history taking. Follows all commands speech and language fluent  Cranial nerve II-XII: Pupils were equal round reactive to light extraocular movements were full, visual field were full on confrontational test. Facial sensation and strength were normal. hearing was intact to finger rubbing bilaterally. Uvula tongue midline. head turning and shoulder shrug were normal and symmetric.Tongue protrusion into cheek strength was normal.  Motor: normal bulk and tone, full strength in the BUE, BLE, . No focal weakness. Outstretched tremor left greater than right.  Coordination: finger-nose-finger, heel-to-shin bilaterally, no dysmetria  Reflexes: Brachioradialis 2/2, biceps 2/2, triceps 2/2, patellar 2/2, Achilles 2/2, plantar responses were flexor bilaterally.  Gait and Station: Rising up from seated position without assistance, normal stance, moderate stride, good arm swing, smooth turning, able to perform tiptoe, and heel walking without difficulty. Tandem gait is steady    DIAGNOSTIC DATA (LABS,  IMAGING, TESTING) -  ASSESSMENT AND PLAN  69 y.o. year old male  has a past medical history of COPD (chronic obstructive pulmonary disease) ([place]);  Sleep apnea;  essential tremor and restless leg syndrome here to follow-up.  Increase propanolol to 80 mg extended release daily, will refill for one month to see if it works and or increased side effects, patient to call back for  refill Follow-up yearly and when necessary Dennie Bible, [facility], [facility], APRN  [facility] [address] [place], [place] [address] [phone]  I reviewed the above note and documentation by the Nurse Practitioner and agree with the history, physical exam, assessment and plan as outlined above. I was immediately available for face-to-face consultation. Star Age, MD, PhD [facility])

## 2015-08-03 ENCOUNTER — Telehealth: Payer: Self-pay | Admitting: Nurse Practitioner

## 2015-08-03 MED ORDER — PROPRANOLOL HCL ER 80 MG PO CP24: 80.0000 mg | ORAL_CAPSULE | Freq: Every day | ORAL | Status: DC

## 2015-08-03 NOTE — Telephone Encounter (Signed)
Rx has been sent.  Receipt confirmed by pharmacy.   

## 2015-08-03 NOTE — Telephone Encounter (Signed)
Patient is calling and states that the Rx Propranolol ER 80 mg 24 hr new dosage is working well and would like to have a Rx for 90 day supply sent to [place].  Thanks!

## 2015-08-24 ENCOUNTER — Other Ambulatory Visit: Payer: Self-pay | Admitting: Urology

## 2015-08-24 DIAGNOSIS — D751 Secondary polycythemia: Secondary | ICD-10-CM

## 2015-09-25 ENCOUNTER — Encounter (HOSPITAL_COMMUNITY): Payer: Self-pay

## 2015-09-25 ENCOUNTER — Encounter (HOSPITAL_COMMUNITY)
Admission: RE | Admit: 2015-09-25 | Discharge: 2015-09-25 | Disposition: A | Discharge status: Home / Self Care | Payer: PPO | Source: Ambulatory Visit | Attending: Urology | Admitting: Urology

## 2015-09-25 VITALS — BP 136/91 | HR 71 | Temp 97.7°F | Resp 20 | Ht 70.5 in | Wt 205.0 lb

## 2015-09-25 DIAGNOSIS — D751 Secondary polycythemia: Secondary | ICD-10-CM | POA: Insufficient documentation

## 2015-09-25 NOTE — Progress Notes (Signed)
Daniel Terrell presents today for phlebotomy per MD orders. HGB/HCT:done in Dr. Cy Blamer Office. Phlebotomy procedure started at 1332 and ended at 1349 500 cc removed. Patient tolerated procedure well. IV needle removed intact.

## 2015-09-30 ENCOUNTER — Telehealth: Payer: Self-pay | Admitting: Internal Medicine

## 2015-09-30 ENCOUNTER — Other Ambulatory Visit: Payer: Self-pay | Admitting: Internal Medicine

## 2015-09-30 DIAGNOSIS — Z23 Encounter for immunization: Secondary | ICD-10-CM | POA: Diagnosis not present

## 2015-09-30 NOTE — Telephone Encounter (Signed)
Patients wife called to advise that pharmacy is sending [place] a fax for patient's Qvar so we can do a PA on it. Awaiting fax.

## 2015-09-30 NOTE — Telephone Encounter (Signed)
Rx sent 

## 2015-10-02 ENCOUNTER — Telehealth: Payer: Self-pay | Admitting: Internal Medicine

## 2015-10-02 NOTE — Telephone Encounter (Signed)
Left message for Sharyn Lull from [facility] to return call.

## 2015-10-05 MED ORDER — BECLOMETHASONE DIPROPIONATE 40 MCG/ACT IN AERS: INHALATION_SPRAY | RESPIRATORY_TRACT | Status: DC

## 2015-10-05 NOTE — Telephone Encounter (Signed)
Pt caling stating that he needs rx sent back asap.Daniel Terrell

## 2015-10-05 NOTE — Telephone Encounter (Signed)
Spoke with Jose at [facility] to [employer] needed for qvar tier exception.  This will be forwarded for review, will await decision.

## 2015-10-05 NOTE — Telephone Encounter (Signed)
Spoke with pt, advised that we are still waiting on the outcome of his tier exception from his insurance before his rx will be covered.  Sample left up front for pt.  Will continue to hold onto message until a decision on tier exception has been made.

## 2015-10-07 NOTE — Telephone Encounter (Signed)
Decision still has not been made. Will await decision.

## 2015-10-12 NOTE — Telephone Encounter (Signed)
Called [facility] [phone] LM to return call to discuss PA status

## 2015-10-13 NOTE — Telephone Encounter (Signed)
Spoke with patient - aware of rec's per CY. Pt to try Arnuity first before purchasing Rx. Offered Arnuity samples, pt states that he has a sample of QVAR that he is going to finish before switching over. Pt aware that once he completes the QVAR that he should start Arnuity and call to let us know if he tolerates it well. If tolerates, we will call in Rx.  Arnuity Ellipta # 1, Ref prn Inhale 1 puff, then rinse mouth, once daily   Nothing further needed.

## 2015-10-13 NOTE — Telephone Encounter (Signed)
QVAR is denied - out of pocket cost is $85 Alternatives are: these 3 meds are tier exception Flovent 15mcg,110mcg, 265mcg Pulmicort 64mcg/180mcg Arnuity  Dr Annamaria Boots please advise. Thanks.    Medication List       This list is accurate as of: 10/02/15 11:59 PM.  Always use your most recent med list.               AEROCHAMBER PLUS inhaler  Use as instructed     albuterol 108 (90 Base) MCG/ACT inhaler  Commonly known as:  PROVENTIL HFA;VENTOLIN HFA  Inhale 1-2 puffs into the lungs every 6 (six) hours as needed for wheezing or shortness of breath.     ALPRAZolam 0.5 MG tablet  Commonly known as:  XANAX  Take 0.5 mg by mouth as needed.     amphetamine-dextroamphetamine 20 MG 24 hr capsule  Commonly known as:  ADDERALL XR  Take 1 capsule (20 mg total) by mouth as needed.     aspirin 81 MG tablet  Take 81 mg by mouth daily.     beclomethasone 40 MCG/ACT inhaler  Commonly known as:  QVAR  INHALE 2 PUFFS BY MOUTH TWICE A DAY. *RINSE MOUTH AFTER USE*     cetirizine 10 MG tablet  Commonly known as:  ZYRTEC  Take 10 mg by mouth daily.     clonazePAM 0.5 MG tablet  Commonly known as:  KLONOPIN  1 or 2 at bedtime as directed     Fenofibric Acid 135 MG Cpdr  Take 1 capsule by mouth daily.     fluticasone 50 MCG/ACT nasal spray  Commonly known as:  FLONASE  Place 2 sprays into the nose daily as needed.     glucosamine-chondroitin 500-400 MG tablet  Take 1 tablet by mouth 2 (two) times daily.     montelukast 10 MG tablet  Commonly known as:  SINGULAIR  Take 10 mg by mouth daily.     MULTIVITAL PO  Take 1 tablet by mouth daily.     PARoxetine 30 MG tablet  Commonly known as:  PAXIL  Take 60 mg by mouth daily.     PRESCRIPTION MEDICATION  Nocturnal oxygen 2 L at night. Per Dr. Keturah Barre     propranolol ER 80 MG 24 hr capsule  Commonly known as:  INDERAL LA  Take 1 capsule (80 mg total) by mouth daily.     testosterone cypionate 200 MG/ML injection  Commonly  known as:  DEPOTESTOSTERONE CYPIONATE  Inject 200 mg into the muscle every 21 ( twenty-one) days.

## 2015-10-13 NOTE — Telephone Encounter (Signed)
Called [facility] at the below number, lmtcb X2 to follow up on PA

## 2015-10-13 NOTE — Telephone Encounter (Signed)
Suggest replace Qvar with Arnuity Ellipta    # 1,  Ref prn     Inhale 1 puff, then rinse mouth, once daily

## 2015-10-21 DIAGNOSIS — J42 Unspecified chronic bronchitis: Secondary | ICD-10-CM | POA: Diagnosis not present

## 2015-10-21 DIAGNOSIS — G4733 Obstructive sleep apnea (adult) (pediatric): Secondary | ICD-10-CM | POA: Diagnosis not present

## 2015-10-21 DIAGNOSIS — J45909 Unspecified asthma, uncomplicated: Secondary | ICD-10-CM | POA: Diagnosis not present

## 2015-10-21 DIAGNOSIS — G4752 REM sleep behavior disorder: Secondary | ICD-10-CM | POA: Diagnosis not present

## 2015-10-21 DIAGNOSIS — J430 Unilateral pulmonary emphysema [MacLeod's syndrome]: Secondary | ICD-10-CM | POA: Diagnosis not present

## 2015-10-28 ENCOUNTER — Telehealth: Payer: Self-pay | Admitting: Internal Medicine

## 2015-10-28 NOTE — Telephone Encounter (Signed)
[name] wife decided to leave with sample.  States she thinks he knows how to use this already and will call if any problems.

## 2015-11-06 ENCOUNTER — Telehealth: Payer: Self-pay | Admitting: Internal Medicine

## 2015-11-06 MED ORDER — BECLOMETHASONE DIPROPIONATE 40 MCG/ACT IN AERS: INHALATION_SPRAY | RESPIRATORY_TRACT | Status: DC

## 2015-11-06 NOTE — Telephone Encounter (Signed)
Spoke with pt. He reports the anuity was causing chest tx and headaches. He reports the QVAR worked better but his insurance cost went up on it. Pt reports he found coupon online and would like RX for [facility] [place]. Please advise Dr. Annamaria Boots thanks

## 2015-11-06 NOTE — Telephone Encounter (Signed)
Ok to refill his Qvar Rx, # 1, inhale 2 puffs then rinse mouth, twice daily, ref x 12

## 2015-11-06 NOTE — Telephone Encounter (Signed)
Called spoke with pt. Aware okay to go back to qvar. [facility]

## 2015-11-19 ENCOUNTER — Telehealth: Payer: Self-pay | Admitting: Internal Medicine

## 2015-11-19 MED ORDER — BECLOMETHASONE DIPROPIONATE 40 MCG/ACT IN AERS: INHALATION_SPRAY | RESPIRATORY_TRACT | Status: DC

## 2015-11-19 NOTE — Telephone Encounter (Signed)
1 sample of Qvar 40 placed at front for pick up. Pt aware, was very appreciative, and voiced no further questions or concerns at this time.

## 2015-11-19 NOTE — Telephone Encounter (Signed)
Called spoke with pt spouse. They are requesting a sample of qvar 40 mcg. They are appealing pt insurance to get the QVAR at a lower cost. Please advise Dr. Annamaria Boots thanks

## 2015-11-19 NOTE — Telephone Encounter (Signed)
Ok sample Qvar 40 if we have one

## 2015-11-21 DIAGNOSIS — J42 Unspecified chronic bronchitis: Secondary | ICD-10-CM | POA: Diagnosis not present

## 2015-11-21 DIAGNOSIS — G4752 REM sleep behavior disorder: Secondary | ICD-10-CM | POA: Diagnosis not present

## 2015-11-21 DIAGNOSIS — J45909 Unspecified asthma, uncomplicated: Secondary | ICD-10-CM | POA: Diagnosis not present

## 2015-11-21 DIAGNOSIS — J430 Unilateral pulmonary emphysema [MacLeod's syndrome]: Secondary | ICD-10-CM | POA: Diagnosis not present

## 2015-11-21 DIAGNOSIS — G4733 Obstructive sleep apnea (adult) (pediatric): Secondary | ICD-10-CM | POA: Diagnosis not present

## 2015-12-15 ENCOUNTER — Telehealth: Payer: Self-pay | Admitting: Internal Medicine

## 2015-12-15 MED ORDER — CLONAZEPAM 0.5 MG PO TABS: ORAL_TABLET | ORAL | Status: DC

## 2015-12-15 NOTE — Telephone Encounter (Signed)
Rx was called in and I spoke with the pt and notified that this was done

## 2015-12-15 NOTE — Telephone Encounter (Signed)
Ok to refill as before 

## 2015-12-15 NOTE — Telephone Encounter (Addendum)
Please advise if okay to refill Last called in on 04/08/15- klonpin 0.5 mg # 90 1-2 at bedtime as directed with 3 rf  Last ov June 2016  Next ov June 2017  Thanks!

## 2015-12-19 DIAGNOSIS — G4733 Obstructive sleep apnea (adult) (pediatric): Secondary | ICD-10-CM | POA: Diagnosis not present

## 2015-12-19 DIAGNOSIS — G4752 REM sleep behavior disorder: Secondary | ICD-10-CM | POA: Diagnosis not present

## 2015-12-19 DIAGNOSIS — J430 Unilateral pulmonary emphysema [MacLeod's syndrome]: Secondary | ICD-10-CM | POA: Diagnosis not present

## 2015-12-19 DIAGNOSIS — J45909 Unspecified asthma, uncomplicated: Secondary | ICD-10-CM | POA: Diagnosis not present

## 2015-12-19 DIAGNOSIS — J42 Unspecified chronic bronchitis: Secondary | ICD-10-CM | POA: Diagnosis not present

## 2016-01-04 ENCOUNTER — Other Ambulatory Visit: Payer: Self-pay | Admitting: Urology

## 2016-01-04 DIAGNOSIS — D751 Secondary polycythemia: Secondary | ICD-10-CM

## 2016-01-15 DIAGNOSIS — Z6829 Body mass index (BMI) 29.0-29.9, adult: Secondary | ICD-10-CM | POA: Diagnosis not present

## 2016-01-15 DIAGNOSIS — J208 Acute bronchitis due to other specified organisms: Secondary | ICD-10-CM | POA: Diagnosis not present

## 2016-01-19 DIAGNOSIS — J45909 Unspecified asthma, uncomplicated: Secondary | ICD-10-CM | POA: Diagnosis not present

## 2016-01-19 DIAGNOSIS — J42 Unspecified chronic bronchitis: Secondary | ICD-10-CM | POA: Diagnosis not present

## 2016-01-19 DIAGNOSIS — G4752 REM sleep behavior disorder: Secondary | ICD-10-CM | POA: Diagnosis not present

## 2016-01-19 DIAGNOSIS — G4733 Obstructive sleep apnea (adult) (pediatric): Secondary | ICD-10-CM | POA: Diagnosis not present

## 2016-01-19 DIAGNOSIS — J430 Unilateral pulmonary emphysema [MacLeod's syndrome]: Secondary | ICD-10-CM | POA: Diagnosis not present

## 2016-01-26 ENCOUNTER — Encounter (HOSPITAL_COMMUNITY)
Admission: RE | Admit: 2016-01-26 | Discharge: 2016-01-26 | Disposition: A | Discharge status: Home / Self Care | Payer: PPO | Source: Ambulatory Visit | Attending: Urology | Admitting: Urology

## 2016-01-26 ENCOUNTER — Encounter (HOSPITAL_COMMUNITY): Payer: Self-pay

## 2016-01-26 DIAGNOSIS — D751 Secondary polycythemia: Secondary | ICD-10-CM | POA: Insufficient documentation

## 2016-01-26 NOTE — Discharge Instructions (Signed)
Therapeutic Phlebotomy, Care After  Refer to this sheet in the next few weeks. These instructions provide you with information about caring for yourself after your procedure. Your health care provider may also give you more specific instructions. Your treatment has been planned according to current medical practices, but problems sometimes occur. Call your health care provider if you have any problems or questions after your procedure.  WHAT TO EXPECT AFTER THE PROCEDURE  After your procedure, it is common to have:   Light-headedness or dizziness. You may feel faint.   Nausea.   Tiredness.  HOME CARE INSTRUCTIONS  Activities   Return to your normal activities as directed by your health care provider. Most people can go back to their normal activities right away.   Avoid strenuous physical activity and heavy lifting or pulling for about 5 hours after the procedure. Do not lift anything that is heavier than 10 lb (4.5 kg).   Athletes should avoid strenuous exercise for at least 12 hours.   Change positions slowly for the remainder of the day. This will help to prevent light-headedness or fainting.   If you feel light-headed, lie down until the feeling goes away.  Eating and Drinking   Be sure to eat well-balanced meals for the next 24 hours.   Drink enough fluid to keep your urine clear or pale yellow.   Avoid drinking alcohol on the day that you had the procedure.  Care of the Needle Insertion Site   Keep your bandage dry. You can remove the bandage after about 5 hours or as directed by your health care provider.   If you have bleeding from the needle insertion site, elevate your arm and press firmly on the site until the bleeding stops.   If you have bruising at the site, apply ice to the area:   Put ice in a plastic bag.   Place a towel between your skin and the bag.   Leave the ice on for 20 minutes, 2-3 times a day for the first 24 hours.   If the swelling does not go away after 24 hours, apply  a warm, moist washcloth to the area for 20 minutes, 2-3 times a day.  General Instructions   Avoid smoking for at least 30 minutes after the procedure.   Keep all follow-up visits as directed by your health care provider. It is important to continue with further therapeutic phlebotomy treatments as directed.  SEEK MEDICAL CARE IF:   You have redness, swelling, or pain at the needle insertion site.   You have fluid, blood, or pus coming from the needle insertion site.   You feel light-headed, dizzy, or nauseated, and the feeling does not go away.   You notice new bruising at the needle insertion site.   You feel weaker than normal.   You have a fever or chills.  SEEK IMMEDIATE MEDICAL CARE IF:   You have severe nausea or vomiting.   You have chest pain.   You have trouble breathing.    This information is not intended to replace advice given to you by your health care provider. Make sure you discuss any questions you have with your health care provider.    Document Released: 02/14/2011 Document Revised: 01/27/2015 Document Reviewed: 09/08/2014  Elsevier Interactive Patient Education 2016 [employer]

## 2016-01-26 NOTE — Procedures (Signed)
Daniel Terrell presents today for phlebotomy per MD orders. HGB/HCT -done in office. Phlebotomy procedure started at 1308 and ended at 1325 500 cc removed. Patient tolerated procedure well. IV needle removed intact.

## 2016-02-10 ENCOUNTER — Other Ambulatory Visit: Payer: Self-pay | Admitting: Internal Medicine

## 2016-02-11 ENCOUNTER — Telehealth: Payer: Self-pay | Admitting: Internal Medicine

## 2016-02-11 MED ORDER — BECLOMETHASONE DIPROPIONATE 40 MCG/ACT IN AERS: 2.0000 | INHALATION_SPRAY | Freq: Two times a day (BID) | RESPIRATORY_TRACT | Status: DC

## 2016-02-11 NOTE — Telephone Encounter (Signed)
He can have a sample of Qvar- either strength, if we have one. Please explain that we don't get enough samples any more to maintain someone. He needs to contact his insurance formulary and see which inhalers in the Qvar category of inhaled steroids are covered by his insurance.

## 2016-02-11 NOTE — Telephone Encounter (Signed)
Called spoke with pt. Informed him that a sample of Qvar 40 was placed at the front for pick up. I explained to him that he will need to contact his insurance about his formulary. He states that he already has and there is no inhaler that would be an alternative to Qvar. He states that he has an upcoming appointment with CY and will discuss it with it at the ov. Pt voiced understanding and had no further questions. Qvar sample placed at the front. Nothing further needed.

## 2016-02-11 NOTE — Telephone Encounter (Signed)
Spoke with pt.  He is requesting a sample of Qvar since insurance will no longer cover this.  He tried Anuity but this didn't help.  Please advise if ok to leave sample.  Allergies  Allergen Reactions  . Amoxicillin   . Avelox [Moxifloxacin]   . Levofloxacin     REACTION: disorientation   . Ofloxacin     REACTION: disorientation      Current Outpatient Prescriptions on File Prior to Visit  Medication Sig Dispense Refill  . albuterol (PROVENTIL HFA;VENTOLIN HFA) 108 (90 BASE) MCG/ACT inhaler Inhale 1-2 puffs into the lungs every 6 (six) hours as needed for wheezing or shortness of breath.    . ALPRAZolam (XANAX) 0.5 MG tablet Take 0.5 mg by mouth as needed.      [name] amphetamine-dextroamphetamine (ADDERALL XR) 20 MG 24 hr capsule Take 1 capsule (20 mg total) by mouth as needed. 30 capsule 0  . aspirin 81 MG tablet Take 81 mg by mouth daily.      . cetirizine (ZYRTEC) 10 MG tablet Take 10 mg by mouth daily.      . Choline Fenofibrate (FENOFIBRIC ACID) 135 MG CPDR Take 1 capsule by mouth daily.     . clonazePAM (KLONOPIN) 0.5 MG tablet 1 or 2 at bedtime as directed 90 tablet 3  . fluticasone (FLONASE) 50 MCG/ACT nasal spray Place 2 sprays into the nose daily as needed.    [name] glucosamine-chondroitin 500-400 MG tablet Take 1 tablet by mouth 2 (two) times daily.      . montelukast (SINGULAIR) 10 MG tablet Take 10 mg by mouth daily.      . Multiple Vitamins-Minerals (MULTIVITAL PO) Take 1 tablet by mouth daily.      [name] PARoxetine (PAXIL) 30 MG tablet Take 60 mg by mouth daily.     [name] PRESCRIPTION MEDICATION Nocturnal oxygen 2 L at night. Per Dr. Keturah Barre    . propranolol ER (INDERAL LA) 80 MG 24 hr capsule Take 1 capsule (80 mg total) by mouth daily. 90 capsule 3  . QVAR 40 MCG/ACT inhaler INHALE 2 PUFFS BY MOUTH TWICE A DAY. *RINSE MOUTH AFTER USE* 8 g 5  . Spacer/Aero-Holding Chambers (AEROCHAMBER PLUS) inhaler Use as instructed 1 each 2  . testosterone cypionate (DEPOTESTOTERONE CYPIONATE)  200 MG/ML injection Inject 200 mg into the muscle every 21 ( twenty-one) days.      No current facility-administered medications on file prior to visit.

## 2016-02-17 ENCOUNTER — Telehealth: Payer: Self-pay | Admitting: Internal Medicine

## 2016-02-17 DIAGNOSIS — E785 Hyperlipidemia, unspecified: Secondary | ICD-10-CM | POA: Diagnosis not present

## 2016-02-17 DIAGNOSIS — Z6829 Body mass index (BMI) 29.0-29.9, adult: Secondary | ICD-10-CM | POA: Diagnosis not present

## 2016-02-17 DIAGNOSIS — Z136 Encounter for screening for cardiovascular disorders: Secondary | ICD-10-CM | POA: Diagnosis not present

## 2016-02-17 DIAGNOSIS — E663 Overweight: Secondary | ICD-10-CM | POA: Diagnosis not present

## 2016-02-17 DIAGNOSIS — J449 Chronic obstructive pulmonary disease, unspecified: Secondary | ICD-10-CM | POA: Diagnosis not present

## 2016-02-17 DIAGNOSIS — E559 Vitamin D deficiency, unspecified: Secondary | ICD-10-CM | POA: Diagnosis not present

## 2016-02-17 DIAGNOSIS — D582 Other hemoglobinopathies: Secondary | ICD-10-CM | POA: Diagnosis not present

## 2016-02-17 MED ORDER — ARMODAFINIL 150 MG PO TABS: 150.0000 mg | ORAL_TABLET | Freq: Every day | ORAL | Status: DC

## 2016-02-17 NOTE — Telephone Encounter (Signed)
Per patient, he would like Nuvigil. Per Dr. Annamaria Boots, call in Nuvigil 150mg  QD, #30, No Refills.  Rx called into pharmacy. [name] wife aware. Nothing further needed.

## 2016-02-17 NOTE — Telephone Encounter (Signed)
Curious why he isn't using adderall more?  Suggest he try Provigil- Similar but generic and a little cheaper than Nuvigil  100 mg, # 30, 1 daily

## 2016-02-17 NOTE — Telephone Encounter (Signed)
Pt was last seen by CY on 03/04/15 with the following instructions:  Patient Instructions     You seem to be doing very well  Ok to continue the oral appliance from Dr Ron Parker and the Oxygen at 2L for sleep  Script printed to refill Adderall of use if needed  Please call if we can help   ------  Pt states he is using the oral appliance and o2 qhs.  C/o extreme fatigue during the day x couple of months.  Pt states he has adderall but "rarely" uses this.  States he only takes it when he will be driving long distances.  Pt states he was on Nuvigil in the past which worked well for him but stopped it d/t cost.  Pt has a pending appt with CY on 03/03/16 and is requesting to restart nuvigil now to be able to discuss how it is working during [place].  Dr. Annamaria Boots, please advise.  Thank you.  Allergies  Allergen Reactions  . Amoxicillin   . Avelox [Moxifloxacin]   . Levofloxacin     REACTION: disorientation   . Ofloxacin     REACTION: disorientation        Current Outpatient Prescriptions on File Prior to Visit  Medication Sig Dispense Refill  . albuterol (PROVENTIL HFA;VENTOLIN HFA) 108 (90 BASE) MCG/ACT inhaler Inhale 1-2 puffs into the lungs every 6 (six) hours as needed for wheezing or shortness of breath.    . ALPRAZolam (XANAX) 0.5 MG tablet Take 0.5 mg by mouth as needed.      [name] amphetamine-dextroamphetamine (ADDERALL XR) 20 MG 24 hr capsule Take 1 capsule (20 mg total) by mouth as needed. 30 capsule 0  . aspirin 81 MG tablet Take 81 mg by mouth daily.      . beclomethasone (QVAR) 40 MCG/ACT inhaler Inhale 2 puffs into the lungs 2 (two) times daily. 1 Inhaler 0  . cetirizine (ZYRTEC) 10 MG tablet Take 10 mg by mouth daily.      . Choline Fenofibrate (FENOFIBRIC ACID) 135 MG CPDR Take 1 capsule by mouth daily.     . clonazePAM (KLONOPIN) 0.5 MG tablet 1 or 2 at bedtime as directed 90 tablet 3  . fluticasone (FLONASE) 50 MCG/ACT nasal spray Place 2 sprays into the nose daily as needed.    [name]  glucosamine-chondroitin 500-400 MG tablet Take 1 tablet by mouth 2 (two) times daily.      . montelukast (SINGULAIR) 10 MG tablet Take 10 mg by mouth daily.      . Multiple Vitamins-Minerals (MULTIVITAL PO) Take 1 tablet by mouth daily.      [name] PARoxetine (PAXIL) 30 MG tablet Take 60 mg by mouth daily.     [name] PRESCRIPTION MEDICATION Nocturnal oxygen 2 L at night. Per Dr. Keturah Barre    . propranolol ER (INDERAL LA) 80 MG 24 hr capsule Take 1 capsule (80 mg total) by mouth daily. 90 capsule 3  . QVAR 40 MCG/ACT inhaler INHALE 2 PUFFS BY MOUTH TWICE A DAY. *RINSE MOUTH AFTER USE* 8 g 5  . Spacer/Aero-Holding Chambers (AEROCHAMBER PLUS) inhaler Use as instructed 1 each 2  . testosterone cypionate (DEPOTESTOTERONE CYPIONATE) 200 MG/ML injection Inject 200 mg into the muscle every 21 ( twenty-one) days.      No current facility-administered medications on file prior to visit.

## 2016-02-17 NOTE — Telephone Encounter (Signed)
He can have script either to refill his Adderall, (or for Provigil/ Nuvigil) but not both

## 2016-02-17 NOTE — Telephone Encounter (Signed)
[name] wife states that patient will not take this medication every day, he will only take it occasionally, when needed.  She said that the cost of Provigil and Nuvigil is the same price for them.  [name] wife says that he does not have any energy since he has stopped the Adderall.  Patient would like refill of Adderall.  Dr. Annamaria Boots, please advise.

## 2016-02-18 DIAGNOSIS — G4752 REM sleep behavior disorder: Secondary | ICD-10-CM | POA: Diagnosis not present

## 2016-02-18 DIAGNOSIS — J430 Unilateral pulmonary emphysema [MacLeod's syndrome]: Secondary | ICD-10-CM | POA: Diagnosis not present

## 2016-02-18 DIAGNOSIS — G4733 Obstructive sleep apnea (adult) (pediatric): Secondary | ICD-10-CM | POA: Diagnosis not present

## 2016-02-18 DIAGNOSIS — J45909 Unspecified asthma, uncomplicated: Secondary | ICD-10-CM | POA: Diagnosis not present

## 2016-02-18 DIAGNOSIS — J42 Unspecified chronic bronchitis: Secondary | ICD-10-CM | POA: Diagnosis not present

## 2016-02-19 ENCOUNTER — Telehealth: Payer: Self-pay | Admitting: Internal Medicine

## 2016-02-19 NOTE — Telephone Encounter (Signed)
Routing to [facility] for follow up.

## 2016-02-22 DIAGNOSIS — Z136 Encounter for screening for cardiovascular disorders: Secondary | ICD-10-CM | POA: Diagnosis not present

## 2016-02-22 DIAGNOSIS — Z8249 Family history of ischemic heart disease and other diseases of the circulatory system: Secondary | ICD-10-CM | POA: Diagnosis not present

## 2016-02-22 DIAGNOSIS — I77811 Abdominal aortic ectasia: Secondary | ICD-10-CM | POA: Diagnosis not present

## 2016-02-24 NOTE — Telephone Encounter (Signed)
Ok to try prior [place] for [employer]. He failed Adderall 20 mg XR

## 2016-02-24 NOTE — Telephone Encounter (Signed)
PA has been started with cover my meds. Key: [id number]. Will await PA decision.

## 2016-02-24 NOTE — Telephone Encounter (Signed)
[place] for PA information on Nuvigil 150mg  Member ID# [id number] PA contact [phone] Spoke with Maricela Curet with [employer] [facility]; states that PA is needed for coverage of Nuvigil and will need to be done through [facility]  Please advise Dr Annamaria Boots if you would like triage to initiate PA for Nuvigil . Thanks.

## 2016-02-25 NOTE — Telephone Encounter (Signed)
Pt aware. Pharmacy contacted. Nothing further needed.

## 2016-02-25 NOTE — Telephone Encounter (Signed)
PA approved from 02-24-16 through 02-23-2017.

## 2016-03-03 ENCOUNTER — Encounter: Payer: Self-pay | Admitting: Internal Medicine

## 2016-03-03 ENCOUNTER — Ambulatory Visit (INDEPENDENT_AMBULATORY_CARE_PROVIDER_SITE_OTHER): Payer: PPO | Admitting: Internal Medicine

## 2016-03-03 VITALS — BP 124/70 | HR 70 | Ht 71.0 in | Wt 213.2 lb

## 2016-03-03 DIAGNOSIS — J4540 Moderate persistent asthma, uncomplicated: Secondary | ICD-10-CM

## 2016-03-03 MED ORDER — BUDESONIDE 180 MCG/ACT IN AEPB: 2.0000 | INHALATION_SPRAY | Freq: Two times a day (BID) | RESPIRATORY_TRACT | Status: DC

## 2016-03-03 MED ORDER — BUDESONIDE 90 MCG/ACT IN AEPB: INHALATION_SPRAY | RESPIRATORY_TRACT | Status: DC

## 2016-03-03 NOTE — Progress Notes (Signed)
Subjective:    Patient ID: Daniel Terrell, male    DOB: 01/30/1946, 70 y.o.   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 Having to sleep sitting up for 3 weeks at night. Denies fever, sore throat or malaise.  Expects sinus infection in November. In the Spring his PCP gave steroid shot and antibiotic for sinusitis- helped on 2 occasions.  Has been taking singulair, Beconase AQ, cetirizine. 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 He expresses interest in tramadol for cough- helps his wife.   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 He states that he has had one sinus infection in the past week-treated with Doxy and Kenolog injection. pt c/o PND left over from sinus infection. Onset was after trip to [place] with climate changes and airplane flights. Still has morning drainage and is sleeping in a chair. Chest/bronchitis bothers more than rhinitis. Using Flonase, not Nasonex or Patanase. Nuvigil continues to be a big help with daytime sleepiness. Sleep hygiene reviewed.  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  Machine is noisy and sweats alot on head with mask on.  Occas wheezing and occas cough - prod at times.  No SOB, chest tightness, or chest pain. Complains CPAP nasal pillows mask face sweaty. Stopped using CPAP 7 or 8 months ago. Nasal congestion better control with Singulair. Occasional cough without wheeze if works outdoors.  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 Had failed CPAP. Donut hole- can't afford provigil/ Nuvigil Says Mouthpiece has reduced AHI from 50 to 20/ hr. Had some TMJ pain. Wife says he still snores. Aware of some EDS.  2 episodes of bronchitis in spring/ summer, treated with antibiotics, injections. Has not been using inhalers. Daily cough, wheeze, DOE. They describe episodes during dreams when he punches his wife- kicking and striking. No hx Parkinson's.  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 Ron Parker later today to adjust it.  No other problems.  CXR 06/03/14- FINDINGS: Two views of the chest demonstrate clear lungs. Heart and mediastinum are within normal limits. The trachea is midline. No acute bone abnormality. IMPRESSION: No active cardiopulmonary disease. Electronically Signed  By: Markus Daft M.D.  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 is on O2 2 L nightly . DME: [facility]. pt. Is doing fairly well  no complaints. 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 Adderall 20 mg XRfor alertness as before. 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 HEENT:   No-  headaches, difficulty swallowing, tooth/dental problems, sore throat,       + sneezing, itching, ear ache, + nasal congestion, post nasal drip,  CV:  No-   chest pain, orthopnea, PND, swelling in lower extremities, anasarca, dizziness, palpitations Resp: +shortness of breath with exertion or at rest.              Per HPI;  No-  coughing up of blood.              No-   change in color of mucus.  + wheezing.   Skin: No-   rash or lesions. GI:  No-   heartburn, indigestion, abdominal pain, nausea, vomiting,  GU:  MS:  No-   joint pain or swelling.   Neuro- nothing unusual Psych:  No- change in mood or affect. No depression or anxiety.  No memory loss. Objective:   Physical Exam General- Alert, Oriented, Affect-appropriate, Distress- none acute   overweight Skin- rash-none, lesions- none, excoriation- none Lymphadenopathy- none Head- atraumatic            Eyes- Gross vision intact, PERRLA, conjunctivae clear secretions            Ears- Hearing, canals normal            Nose- +sniffing but clear , No- Septal dev, mucus, polyps, erosion, perforation             Throat- + S/P UPPP, mucosa clear , drainage- none, tonsils- atrophic Neck- flexible , trachea midline, no stridor , thyroid nl, carotid no bruit Chest - symmetrical excursion , unlabored           Heart/CV- RRR , no murmur , no gallop  , no rub, nl s1 s2                           - JVD- none , edema- none, stasis changes- none, varices- none           Lung- clear to P&A, wheeze- none, cough- none , dullness-none, rub- none   Not coughing at all while here           Chest wall-  Abd-  Br/ Gen/ Rectal- Not done, not indicated Extrem- cyanosis- none, clubbing, none, atrophy- none, strength- nl Neuro- grossly intact to observation Assessment & Plan:

## 2016-03-03 NOTE — Patient Instructions (Addendum)
Script and sample for Pulmicort HFA   Inhale 2 puffs then rinse mouth, twice daily maintenance

## 2016-03-03 NOTE — Progress Notes (Signed)
Patient ID: Daniel Terrell, male   DOB: 04-29-1946, 70 y.o.   MRN: [id number]  Patient seen in the office today and instructed on use of Pulmicort 180 flexhaler.  Patient expressed understanding and demonstrated technique.

## 2016-03-11 ENCOUNTER — Telehealth: Payer: Self-pay | Admitting: Internal Medicine

## 2016-03-11 NOTE — Telephone Encounter (Signed)
lmtcb x1 for pt. 

## 2016-03-14 NOTE — Telephone Encounter (Signed)
Wife is calling back again about his medafodil needs generic [facility] states they do not have a presc for this [facility] on [address]

## 2016-03-14 NOTE — Telephone Encounter (Signed)
Spoke with pt's wife, states that a PA is needed for generic Nuvigil.   Pt uses [facility] on [address].   Called Pharmacist, states that name brand nuvigil was approved through insurance but has a high copay-which is why pt is requesting generic.   PA form being faxed to our office.  Will await fax.

## 2016-03-14 NOTE — Telephone Encounter (Signed)
lmtcb X1 for pt  

## 2016-03-15 NOTE — Telephone Encounter (Signed)
Return call from  tosha calling from [place] she can be reached @ [phone] ref # [id number].Hillery Hunter

## 2016-03-15 NOTE — Telephone Encounter (Signed)
Spoke with Sonia Side at [facility] advised that per previous message PA form was signed and faxed to their facility today - She states she will check for fax and call back if there was a problem-prm

## 2016-03-15 NOTE — Telephone Encounter (Signed)
Will hold in triage for f/u

## 2016-03-15 NOTE — Telephone Encounter (Signed)
PA form received and given to CY for signature. Form signed and faxed to [facility].  LMTCB for [facility]

## 2016-03-16 ENCOUNTER — Telehealth: Payer: Self-pay | Admitting: Internal Medicine

## 2016-03-16 NOTE — Telephone Encounter (Signed)
Daniel Terrell from [facility] returning call stating that both PA's have been approved and that she was faxing this info over if you have any questions you can reach her directly [phone].Hillery Hunter

## 2016-03-16 NOTE — Telephone Encounter (Signed)
LM for Daniel Terrell to verify the information about the Nuvigil.

## 2016-03-16 NOTE — Telephone Encounter (Signed)
Called and spoke with [place] at [facility]. She states that the PA for the armodafinil was approved as of 03/15/16. She states she is faxing over the approval letter to [phone].   Called and spoke with pt's wife. I informed her that PA was approved. She states that the medication has been picked up from the pharmacy. She voiced understanding and had no further questions. Nothing further needed.

## 2016-03-16 NOTE — Telephone Encounter (Signed)
lmtcb x1 with [facility].

## 2016-03-17 NOTE — Telephone Encounter (Signed)
[facility] to verify that rx was being approved- this was verified by pharmacist. Daniel Terrell with pt, aware of recs.  Nothing further needed.

## 2016-03-17 NOTE — Telephone Encounter (Signed)
We received a fax that Armodafinil 150mg  tablet has been approved from 03/15/16-03/15/17.

## 2016-03-20 DIAGNOSIS — J45909 Unspecified asthma, uncomplicated: Secondary | ICD-10-CM | POA: Diagnosis not present

## 2016-03-20 DIAGNOSIS — J42 Unspecified chronic bronchitis: Secondary | ICD-10-CM | POA: Diagnosis not present

## 2016-03-20 DIAGNOSIS — G4752 REM sleep behavior disorder: Secondary | ICD-10-CM | POA: Diagnosis not present

## 2016-03-20 DIAGNOSIS — J430 Unilateral pulmonary emphysema [MacLeod's syndrome]: Secondary | ICD-10-CM | POA: Diagnosis not present

## 2016-03-20 DIAGNOSIS — G4733 Obstructive sleep apnea (adult) (pediatric): Secondary | ICD-10-CM | POA: Diagnosis not present

## 2016-04-19 DIAGNOSIS — G4733 Obstructive sleep apnea (adult) (pediatric): Secondary | ICD-10-CM | POA: Diagnosis not present

## 2016-04-19 DIAGNOSIS — J430 Unilateral pulmonary emphysema [MacLeod's syndrome]: Secondary | ICD-10-CM | POA: Diagnosis not present

## 2016-04-19 DIAGNOSIS — J45909 Unspecified asthma, uncomplicated: Secondary | ICD-10-CM | POA: Diagnosis not present

## 2016-04-19 DIAGNOSIS — J42 Unspecified chronic bronchitis: Secondary | ICD-10-CM | POA: Diagnosis not present

## 2016-04-19 DIAGNOSIS — G4752 REM sleep behavior disorder: Secondary | ICD-10-CM | POA: Diagnosis not present

## 2016-04-27 DIAGNOSIS — D582 Other hemoglobinopathies: Secondary | ICD-10-CM | POA: Diagnosis not present

## 2016-05-20 DIAGNOSIS — J42 Unspecified chronic bronchitis: Secondary | ICD-10-CM | POA: Diagnosis not present

## 2016-05-20 DIAGNOSIS — G4752 REM sleep behavior disorder: Secondary | ICD-10-CM | POA: Diagnosis not present

## 2016-05-20 DIAGNOSIS — J45909 Unspecified asthma, uncomplicated: Secondary | ICD-10-CM | POA: Diagnosis not present

## 2016-05-20 DIAGNOSIS — G4733 Obstructive sleep apnea (adult) (pediatric): Secondary | ICD-10-CM | POA: Diagnosis not present

## 2016-05-20 DIAGNOSIS — J430 Unilateral pulmonary emphysema [MacLeod's syndrome]: Secondary | ICD-10-CM | POA: Diagnosis not present

## 2016-06-20 DIAGNOSIS — J430 Unilateral pulmonary emphysema [MacLeod's syndrome]: Secondary | ICD-10-CM | POA: Diagnosis not present

## 2016-06-20 DIAGNOSIS — G4752 REM sleep behavior disorder: Secondary | ICD-10-CM | POA: Diagnosis not present

## 2016-06-20 DIAGNOSIS — G4733 Obstructive sleep apnea (adult) (pediatric): Secondary | ICD-10-CM | POA: Diagnosis not present

## 2016-06-20 DIAGNOSIS — J45909 Unspecified asthma, uncomplicated: Secondary | ICD-10-CM | POA: Diagnosis not present

## 2016-06-20 DIAGNOSIS — J42 Unspecified chronic bronchitis: Secondary | ICD-10-CM | POA: Diagnosis not present

## 2016-06-27 ENCOUNTER — Telehealth: Payer: Self-pay | Admitting: Internal Medicine

## 2016-06-27 MED ORDER — ARMODAFINIL 150 MG PO TABS
150.0000 mg | ORAL_TABLET | Freq: Every day | ORAL | 0 refills | Status: DC
Start: 2016-06-27 — End: 2016-08-16

## 2016-06-27 NOTE — Telephone Encounter (Signed)
Pt is needing a refill of the Armodafinil 150 mg  Sent in to his pharmacy.  Pt was last seen by CY on 03/03/16 and has no pending appts.  CY please advise if ok to call in for refill. Thanks  Allergies  Allergen Reactions  . Amoxicillin   . Avelox [Moxifloxacin]   . Levofloxacin     REACTION: disorientation   . Ofloxacin     REACTION: disorientation

## 2016-06-27 NOTE — Telephone Encounter (Signed)
Spoke with pt and advised. Rx called in to pharmacy. Nothing further needed.

## 2016-06-27 NOTE — Telephone Encounter (Signed)
Ok to refill.  He needs to be seen here once a year to maintain this medication.

## 2016-07-20 DIAGNOSIS — G4752 REM sleep behavior disorder: Secondary | ICD-10-CM | POA: Diagnosis not present

## 2016-07-20 DIAGNOSIS — G4733 Obstructive sleep apnea (adult) (pediatric): Secondary | ICD-10-CM | POA: Diagnosis not present

## 2016-07-20 DIAGNOSIS — J45909 Unspecified asthma, uncomplicated: Secondary | ICD-10-CM | POA: Diagnosis not present

## 2016-07-20 DIAGNOSIS — J430 Unilateral pulmonary emphysema [MacLeod's syndrome]: Secondary | ICD-10-CM | POA: Diagnosis not present

## 2016-07-20 DIAGNOSIS — J42 Unspecified chronic bronchitis: Secondary | ICD-10-CM | POA: Diagnosis not present

## 2016-07-25 ENCOUNTER — Encounter: Payer: Self-pay | Admitting: Nurse Practitioner

## 2016-07-25 ENCOUNTER — Ambulatory Visit (INDEPENDENT_AMBULATORY_CARE_PROVIDER_SITE_OTHER): Payer: PPO | Admitting: Nurse Practitioner

## 2016-07-25 VITALS — BP 150/90 | HR 76 | Ht 71.0 in | Wt 212.6 lb

## 2016-07-25 DIAGNOSIS — G2581 Restless legs syndrome: Secondary | ICD-10-CM

## 2016-07-25 DIAGNOSIS — G250 Essential tremor: Secondary | ICD-10-CM

## 2016-07-25 MED ORDER — PROPRANOLOL HCL ER 80 MG PO CP24: 80.0000 mg | ORAL_CAPSULE | Freq: Every day | ORAL | 3 refills | Status: DC

## 2016-07-25 NOTE — Progress Notes (Addendum)
[facility]  PATIENT: Daniel Terrell DOB: 1946/07/23   REASON FOR VISIT: Essential tremor , restless leg syndrome HISTORY FROM: Patient    HISTORY OF PRESENT ILLNESS:Daniel Terrell, 70 year old male returns for yearly followup. He has a history of essential tremor since his 93s.There is a family history of tremor. He also has a history of restless leg syndrome. Primidone was added to his treatment regimen but caused erectile dysfunction and he weaned off of the medication several years ago.  He continues on his long-acting propanolol 80 mg . He has been placed on Klonopin by his primary care he claims this has helped his restless legs and his REM behavior sleep disorder. He has obtained weighted utensils for eating and claims this has helped tremendously. He still has trouble playing the guitar. He is right handed and his tremor is worse in the left. He denies dropping things. He returns for reevaluation  HISTORY: essential tremor. He previously saw Dr. Morene Antu and was last seen by him on 11/13/2012 at which time he was switched to long-acting propranolol. Dr. Erling Cruz was hesitant to put him on primidone in view of his memory loss. His last MMSE was 27. He also has an underlying medical history of asthma so Dr. Erling Cruz was hesitant to increase his propranolol and dose. There is a family history of kidney stones but no personal history of kidney stones and he was also hesitant to try him on Topamax because of this as well as the cognitive side effects that can produce. The patient has an underlying medical history of prostate cancer, status post surgery in January 2009, Sleep apnea on CPAP, asthma, OCD, restless leg syndrome, and memory loss.  The tremor has gradually been progressive. 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 He has difficulty with other activities such as playing the guitar. He was started on Xanax in 2012 by his primary  care physician and was started on propranolol 20 mg 3 times a day by Dr. love this was decreased to 20 mg daily. He had fatigue. He is now on Mirapex for RLS. [name] He has seen Dr. Annamaria Boots for his sleep apnea and is currently not using the CPAP. He has had memory loss since his prostate surgery.  He feels, that going on long acting Propranolol has helped, but he still has a tremor. It has affected his hands, but not his neck or jaw or voice. His RLS is under control with Mirapex 0.125 mg strength one pill at night only. He has actually not taken it twice daily.    REVIEW OF SYSTEMS: Full 14 system review of systems performed and notable only for those listed, all others are neg:  Constitutional: neg  Cardiovascular: neg Ear/Nose/Throat: neg  Skin: neg Eyes: neg Respiratory: Cough shortness of breath Gastroitestinal: neg  Hematology/Lymphatic: neg  Endocrine: neg Musculoskeletal:neg Allergy/Immunology: neg Neurological: Essential tremor Psychiatric: neg Sleep : Obstructive sleep apnea, oxygen at night   ALLERGIES: Allergies  Allergen Reactions  . Amoxicillin   . Avelox [Moxifloxacin]   . Levofloxacin     REACTION: disorientation   . Ofloxacin     REACTION: disorientation      HOME MEDICATIONS: Outpatient Medications Prior to Visit  Medication Sig Dispense Refill  . albuterol (PROVENTIL HFA;VENTOLIN HFA) 108 (90 BASE) MCG/ACT inhaler Inhale 1-2 puffs into the lungs every 6 (six) hours as needed for wheezing or shortness of breath.    . amphetamine-dextroamphetamine (  ADDERALL XR) 20 MG 24 hr capsule Take 1 capsule (20 mg total) by mouth as needed. 30 capsule 0  . Armodafinil 150 MG tablet Take 1 tablet (150 mg total) by mouth daily. (Patient taking differently: Take 150 mg by mouth daily as needed. ) 30 tablet 0  . aspirin 81 MG tablet Take 81 mg by mouth daily.      . budesonide (PULMICORT FLEXHALER) 180 MCG/ACT inhaler Inhale 2 puffs into the lungs 2 (two) times daily. 1 Inhaler  0  . cetirizine (ZYRTEC) 10 MG tablet Take 10 mg by mouth daily.      . Choline Fenofibrate (FENOFIBRIC ACID) 135 MG CPDR Take 1 capsule by mouth daily.     . clonazePAM (KLONOPIN) 0.5 MG tablet 1 or 2 at bedtime as directed (Patient taking differently: 1 or 2 at bedtime as directed (pt taking 1/2 tablet )) 90 tablet 3  . glucosamine-chondroitin 500-400 MG tablet Take 1 tablet by mouth 2 (two) times daily.      . montelukast (SINGULAIR) 10 MG tablet Take 10 mg by mouth daily.      . Multiple Vitamins-Minerals (MULTIVITAL PO) Take 1 tablet by mouth daily.      [name] PARoxetine (PAXIL) 30 MG tablet Take 60 mg by mouth daily.     [name] PRESCRIPTION MEDICATION Nocturnal oxygen 2 L at night. Per Dr. Keturah Barre    . propranolol ER (INDERAL LA) 80 MG 24 hr capsule Take 1 capsule (80 mg total) by mouth daily. 90 capsule 3  . Spacer/Aero-Holding Chambers (AEROCHAMBER PLUS) inhaler Use as instructed 1 each 2  . testosterone cypionate (DEPOTESTOTERONE CYPIONATE) 200 MG/ML injection Inject 200 mg into the muscle every 21 ( twenty-one) days.     . fluticasone (FLONASE) 50 MCG/ACT nasal spray Place 2 sprays into the nose daily as needed.    . Budesonide 90 MCG/ACT inhaler Inhale 2 puffs then rinse mouth, twice daily (Patient not taking: Reported on 07/25/2016) 1 Inhaler 12  . QVAR 40 MCG/ACT inhaler INHALE 2 PUFFS BY MOUTH TWICE A DAY. *RINSE MOUTH AFTER USE* (Patient not taking: Reported on 07/25/2016) 8 g 5   No facility-administered medications prior to visit.     PAST MEDICAL HISTORY: Past Medical History:  Diagnosis Date  . Arthritis   . Asthma   . Colon polyps   . Complication of anesthesia   . COPD (chronic obstructive pulmonary disease) ([place])   . Hypertriglyceridemia   . IBS (irritable bowel syndrome)   . OCD (obsessive compulsive disorder)   . PONV (postoperative nausea and vomiting)   . Prostate cancer ([place])   . Sleep apnea     PAST SURGICAL HISTORY: Past Surgical History:  Procedure  Laterality Date  . BLADDER SURGERY     extension  . FINGER SURGERY     left x 3  . GREAT TOE ARTHRODESIS, INTERPHALANGEAL JOINT    . KNEE ARTHROSCOPY     left  . PROSTATECTOMY    . TONSILLECTOMY      FAMILY HISTORY: Family History  Problem Relation Age of Onset  . Colon polyps Mother     son  . Breast cancer Maternal Grandmother   . Colon cancer Maternal Grandfather     SOCIAL HISTORY: Social History   Social History  . Marital status: Married    Spouse name: Denice Paradise  . Number of children: 3  . Years of education: BA   Occupational History  . pastor   . Retired  Social History Main Topics  . Smoking status: Former Smoker    Types: Cigarettes    Quit date: 09/26/1970  . Smokeless tobacco: Never Used  . Alcohol use No  . Drug use: No  . Sexual activity: Not on file   Other Topics Concern  . Not on file   Social History Narrative   Patient is married with 3 children.   Patient is right handed.   Patient has a Bachelor's degree.   Patient drinks 2-3 cups daily.           PHYSICAL EXAM  Vitals:   07/25/16 0957  BP: (!) 150/90  Pulse: 76  Weight: 212 lb 9.6 oz (96.4 kg)  Height: 5\' 11"  (1.803 m)   Body mass index is 29.65 kg/m. Generalized: Well developed, obese male in no acute distress  Head: normocephalic and atraumatic,. Oropharynx benign  Neck: Supple, no carotid bruits  Musculoskeletal: No deformity  Neurological examination  Mentation: Alert oriented to time, place, history taking. Follows all commands speech and language fluent  Cranial nerve II-XII: Pupils were equal round reactive to light extraocular movements were full, visual field were full on confrontational test. Facial sensation and strength were normal. hearing was intact to finger rubbing bilaterally. Uvula tongue midline. head turning and shoulder shrug were normal and symmetric.Tongue protrusion into cheek strength was normal.  Motor: normal bulk and tone, full strength in the  BUE, BLE, . No focal weakness. Outstretched tremor left greater than right.  Coordination: finger-nose-finger, heel-to-shin bilaterally, no dysmetria  Reflexes: Brachioradialis 2/2, biceps 2/2, triceps 2/2, patellar 2/2, Achilles 2/2, plantar responses were flexor bilaterally.  Gait and Station: Rising up from seated position without assistance, normal stance, moderate stride, good arm swing, smooth turning, able to perform tiptoe, and heel walking without difficulty. Tandem gait is steady    DIAGNOSTIC DATA (LABS, IMAGING, TESTING) -  ASSESSMENT AND PLAN  70 y.o. year old male  has a past medical history of COPD (chronic obstructive pulmonary disease) ([place]);  Sleep apnea;  essential tremor and restless leg syndrome here to follow-up.  PLAN:Continue propanolol to 80 mg extended release daily, will refill Follow-up yearly and when necessary, next with Dr. Wyvonne Lenz, [facility], APRN  [facility] [address] [place], [place] [address] [phone]  I reviewed the above note and documentation by the Nurse Practitioner and agree with the history, physical exam, assessment and plan as outlined above. I was immediately available for face-to-face consultation.  Patient should follow up at least yearly, has not been seen in over 3 years.   Star Age, MD, PhD [facility])

## 2016-07-25 NOTE — Patient Instructions (Signed)
Continue propanolol to 80 mg extended release daily, will refill Follow-up yearly and when necessary

## 2016-08-12 DIAGNOSIS — Z8546 Personal history of malignant neoplasm of prostate: Secondary | ICD-10-CM | POA: Diagnosis not present

## 2016-08-12 DIAGNOSIS — E291 Testicular hypofunction: Secondary | ICD-10-CM | POA: Diagnosis not present

## 2016-08-12 DIAGNOSIS — N5201 Erectile dysfunction due to arterial insufficiency: Secondary | ICD-10-CM | POA: Diagnosis not present

## 2016-08-16 ENCOUNTER — Telehealth: Payer: Self-pay | Admitting: Internal Medicine

## 2016-08-16 MED ORDER — ARMODAFINIL 150 MG PO TABS: 150.0000 mg | ORAL_TABLET | Freq: Every day | ORAL | 0 refills | Status: DC

## 2016-08-16 NOTE — Telephone Encounter (Signed)
Called the refill of this medication to the pharmacy. Pt is aware and nothing further is needed.

## 2016-08-20 DIAGNOSIS — J45909 Unspecified asthma, uncomplicated: Secondary | ICD-10-CM | POA: Diagnosis not present

## 2016-08-20 DIAGNOSIS — J430 Unilateral pulmonary emphysema [MacLeod's syndrome]: Secondary | ICD-10-CM | POA: Diagnosis not present

## 2016-08-20 DIAGNOSIS — J42 Unspecified chronic bronchitis: Secondary | ICD-10-CM | POA: Diagnosis not present

## 2016-08-20 DIAGNOSIS — G4733 Obstructive sleep apnea (adult) (pediatric): Secondary | ICD-10-CM | POA: Diagnosis not present

## 2016-08-20 DIAGNOSIS — G4752 REM sleep behavior disorder: Secondary | ICD-10-CM | POA: Diagnosis not present

## 2016-08-23 DIAGNOSIS — Z23 Encounter for immunization: Secondary | ICD-10-CM | POA: Diagnosis not present

## 2016-08-23 DIAGNOSIS — J449 Chronic obstructive pulmonary disease, unspecified: Secondary | ICD-10-CM | POA: Diagnosis not present

## 2016-08-23 DIAGNOSIS — E785 Hyperlipidemia, unspecified: Secondary | ICD-10-CM | POA: Diagnosis not present

## 2016-08-23 DIAGNOSIS — D582 Other hemoglobinopathies: Secondary | ICD-10-CM | POA: Diagnosis not present

## 2016-08-24 ENCOUNTER — Telehealth: Payer: Self-pay | Admitting: Adult Health

## 2016-08-24 NOTE — Telephone Encounter (Signed)
Dr. Erling Cruz patient, I have seen him once over 3 years ago.  Okay from my end to switch as per patient request.

## 2016-08-24 NOTE — Telephone Encounter (Signed)
Patient wrote a letter and would like to switch his primary neurologist to Dr. Jannifer Franklin.

## 2016-08-24 NOTE — Telephone Encounter (Signed)
I have no problem with seeing this patient if desired.

## 2016-08-25 NOTE — Telephone Encounter (Signed)
Anderson Malta, can you call the patient and schedule an appointment. Thanks!

## 2016-08-25 NOTE — Telephone Encounter (Signed)
Called pt and left VM to return call to schedule appt w/ Dr. Jannifer Franklin.

## 2016-08-26 NOTE — Telephone Encounter (Signed)
Spoke to pt and appt was scheduled for Wed 12/20 at noon.

## 2016-08-29 DIAGNOSIS — E291 Testicular hypofunction: Secondary | ICD-10-CM | POA: Diagnosis not present

## 2016-08-29 DIAGNOSIS — C61 Malignant neoplasm of prostate: Secondary | ICD-10-CM | POA: Diagnosis not present

## 2016-09-14 ENCOUNTER — Ambulatory Visit (INDEPENDENT_AMBULATORY_CARE_PROVIDER_SITE_OTHER): Payer: PPO | Admitting: Neurology

## 2016-09-14 ENCOUNTER — Encounter: Payer: Self-pay | Admitting: Neurology

## 2016-09-14 VITALS — BP 158/100 | HR 70 | Ht 70.0 in | Wt 212.5 lb

## 2016-09-14 DIAGNOSIS — G250 Essential tremor: Secondary | ICD-10-CM | POA: Diagnosis not present

## 2016-09-14 DIAGNOSIS — G2581 Restless legs syndrome: Secondary | ICD-10-CM | POA: Diagnosis not present

## 2016-09-14 MED ORDER — TOPIRAMATE 25 MG PO TABS: 25.0000 mg | ORAL_TABLET | Freq: Two times a day (BID) | ORAL | 3 refills | Status: DC

## 2016-09-14 NOTE — Progress Notes (Signed)
Reason for visit: Essential tremor  Daniel Terrell is an 70 y.o. male  History of present illness:  Daniel Terrell is a 70 year old right-handed white male with a history of an essential tremor. The patient has had tremors since he was around 70 years old. He claims that his mother also has a tremor. The tremor started in the right hand but eventually went to the left hand. The patient has difficulty with handwriting and with feeding himself. He likes to play the guitar, but this is becoming increasingly difficult. The patient may use weights on the wrist to help, but again this only helps slightly. The patient denies any problems with balance. He has no tremor involving the head or neck or involving the voice. He is on propranolol, he is tolerating this well. The medication helped initially but it has worn off some with its benefit. Primidone in the past was not well-tolerated.  Past Medical History:  Diagnosis Date  . Arthritis   . Asthma   . Colon polyps   . Complication of anesthesia   . COPD (chronic obstructive pulmonary disease) ([place])   . Hypertriglyceridemia   . IBS (irritable bowel syndrome)   . OCD (obsessive compulsive disorder)   . PONV (postoperative nausea and vomiting)   . Prostate cancer ([place])   . Sleep apnea     Past Surgical History:  Procedure Laterality Date  . BLADDER SURGERY     extension  . FINGER SURGERY     left x 3  . GREAT TOE ARTHRODESIS, INTERPHALANGEAL JOINT    . KNEE ARTHROSCOPY     left  . PROSTATECTOMY    . TONSILLECTOMY      Family History  Problem Relation Age of Onset  . Colon polyps Mother     son  . Tremor Mother   . Breast cancer Maternal Grandmother   . Colon cancer Maternal Grandfather     Social history:  reports that he quit smoking about 46 years ago. His smoking use included Cigarettes. He has never used smokeless tobacco. He reports that he does not drink alcohol or use drugs.    Allergies  Allergen Reactions  .  Amoxicillin   . Avelox [Moxifloxacin]   . Levofloxacin     REACTION: disorientation   . Ofloxacin     REACTION: disorientation      Medications:  Prior to Admission medications   Medication Sig Start Date End Date Taking? Authorizing Provider  albuterol (PROVENTIL HFA;VENTOLIN HFA) 108 (90 BASE) MCG/ACT inhaler Inhale 1-2 puffs into the lungs every 6 (six) hours as needed for wheezing or shortness of breath.   Yes Historical Provider, MD  amphetamine-dextroamphetamine (ADDERALL XR) 20 MG 24 hr capsule Take 1 capsule (20 mg total) by mouth as needed. 03/04/15  Yes Deneise Lever, MD  Armodafinil 150 MG tablet Take 1 tablet (150 mg total) by mouth daily. 08/16/16  Yes Deneise Lever, MD  aspirin 81 MG tablet Take 81 mg by mouth daily.     Yes Historical Provider, MD  budesonide (PULMICORT FLEXHALER) 180 MCG/ACT inhaler Inhale 2 puffs into the lungs 2 (two) times daily. 03/03/16  Yes Deneise Lever, MD  cetirizine (ZYRTEC) 10 MG tablet Take 10 mg by mouth daily.     Yes Historical Provider, MD  Choline Fenofibrate (FENOFIBRIC ACID) 135 MG CPDR Take 1 capsule by mouth daily.  10/15/13  Yes Historical Provider, MD  clonazePAM (KLONOPIN) 0.5 MG tablet 1 or 2 at bedtime  as directed Patient taking differently: 1 or 2 at bedtime as directed (pt taking 1/2 tablet ) 12/15/15  Yes Deneise Lever, MD  glucosamine-chondroitin 500-400 MG tablet Take 1 tablet by mouth 2 (two) times daily.     Yes Historical Provider, MD  montelukast (SINGULAIR) 10 MG tablet Take 10 mg by mouth daily.     Yes Historical Provider, MD  Multiple Vitamins-Minerals (MULTIVITAL PO) Take 1 tablet by mouth daily.     Yes Historical Provider, MD  PARoxetine (PAXIL) 30 MG tablet Take 60 mg by mouth daily.    Yes Historical Provider, MD  PRESCRIPTION MEDICATION Nocturnal oxygen 2 L at night. Per Dr. Keturah Barre   Yes Historical Provider, MD  propranolol ER (INDERAL LA) 80 MG 24 hr capsule Take 1 capsule (80 mg total) by mouth daily.  07/25/16  Yes Dennie Bible, NP  Spacer/Aero-Holding Chambers (AEROCHAMBER PLUS) inhaler Use as instructed 06/03/14  Yes Deneise Lever, MD  testosterone cypionate (DEPOTESTOTERONE CYPIONATE) 200 MG/ML injection Inject 200 mg into the muscle every 21 ( twenty-one) days.  10/31/13  Yes Historical Provider, MD  fluticasone (FLONASE) 50 MCG/ACT nasal spray Place 2 sprays into the nose daily as needed. 09/20/12 03/03/16  Deneise Lever, MD  topiramate (TOPAMAX) 25 MG tablet Take 1 tablet (25 mg total) by mouth 2 (two) times daily. 09/14/16   Kathrynn Ducking, MD    ROS:  Out of a complete 14 system review of symptoms, the patient complains only of the following symptoms, and all other reviewed systems are negative.  Excessive sweating Wheezing, shortness of breath Heat intolerance Restless legs, sleep apnea, snoring, acting out dreams Tremors  Blood pressure (!) 158/100, pulse 70, height 5\' 10"  (1.778 m), weight 212 lb 8 oz (96.4 kg).  Physical Exam  General: The patient is alert and cooperative at the time of the examination.  Skin: No significant peripheral edema is noted.   Neurologic Exam  Mental status: The patient is alert and oriented x 3 at the time of the examination. The patient has apparent normal recent and remote memory, with an apparently normal attention span and concentration ability.   Cranial nerves: Facial symmetry is present. Speech is normal, no aphasia or dysarthria is noted. Extraocular movements are full. Visual fields are full.  Motor: The patient has good strength in all 4 extremities.  Sensory examination: Soft touch sensation is symmetric on the face, arms, and legs.  Coordination: The patient has good finger-nose-finger and heel-to-shin bilaterally. An intention tremor seen with finger-nose-finger bilaterally. With drawing a spiral, tremor is translated into the handwriting.  Gait and station: The patient has a normal gait. Tandem gait is normal.  Romberg is negative. No drift is seen.  Reflexes: Deep tendon reflexes are symmetric.   Assessment/Plan:  1. Essential tremor  The patient is having gradually worsening problems with the tremor. We may give a trial on low-dose Topamax to see if this helps him a he will call for dose adjustments. He will follow-up in 6 months, he will remain on the propranolol. The possibility of a deep brain stimulator was discussed with the patient.  Jill Alexanders MD 09/14/2016 1:07 PM  [facility] [address] [place],  [address]  Phone [phone] Fax [phone]

## 2016-09-14 NOTE — Patient Instructions (Signed)
   Topamax (topiramate) is a seizure medication that has an FDA approval for seizures and for migraine headache. Potential side effects of this medication include weight loss, cognitive slowing, tingling in the fingers and toes, and carbonated drinks will taste bad. If any significant side effects are noted on this drug, please contact our office.  

## 2016-09-19 DIAGNOSIS — G4752 REM sleep behavior disorder: Secondary | ICD-10-CM | POA: Diagnosis not present

## 2016-09-19 DIAGNOSIS — J45909 Unspecified asthma, uncomplicated: Secondary | ICD-10-CM | POA: Diagnosis not present

## 2016-09-19 DIAGNOSIS — J42 Unspecified chronic bronchitis: Secondary | ICD-10-CM | POA: Diagnosis not present

## 2016-09-19 DIAGNOSIS — G4733 Obstructive sleep apnea (adult) (pediatric): Secondary | ICD-10-CM | POA: Diagnosis not present

## 2016-09-19 DIAGNOSIS — J430 Unilateral pulmonary emphysema [MacLeod's syndrome]: Secondary | ICD-10-CM | POA: Diagnosis not present

## 2016-10-06 ENCOUNTER — Telehealth: Payer: Self-pay | Admitting: Internal Medicine

## 2016-10-06 MED ORDER — MOMETASONE FUROATE 100 MCG/ACT IN AERO: 2.0000 | INHALATION_SPRAY | Freq: Two times a day (BID) | RESPIRATORY_TRACT | 3 refills | Status: DC

## 2016-10-06 NOTE — Telephone Encounter (Signed)
CY  Please Advise-  Pt. Called in and stated that his insurance will no longer cover the Pulmicort, and they informed him that they would cover Asmanex. Pt. Is wanting a 90 day supply.   Pharmacy of choice [facility]   Instructions      Return in about 1 year (around 03/03/2017).  Script and sample for Pulmicort HFA   Inhale 2 puffs then rinse mouth, twice daily maintenance

## 2016-10-06 NOTE — Telephone Encounter (Signed)
Spoke with pt. And informed him of CY message. Rx was sent to the pharmacy of pt.'s choice. Nothing further as of now is needed.

## 2016-10-06 NOTE — Telephone Encounter (Signed)
Ok to change his Pulmicort to Asmanex 100     # 3, ref x 3,    Inhale 2 puffs then rinse mouth, twice daily

## 2016-10-20 DIAGNOSIS — G4752 REM sleep behavior disorder: Secondary | ICD-10-CM | POA: Diagnosis not present

## 2016-10-20 DIAGNOSIS — J42 Unspecified chronic bronchitis: Secondary | ICD-10-CM | POA: Diagnosis not present

## 2016-10-20 DIAGNOSIS — J430 Unilateral pulmonary emphysema [MacLeod's syndrome]: Secondary | ICD-10-CM | POA: Diagnosis not present

## 2016-10-20 DIAGNOSIS — J45909 Unspecified asthma, uncomplicated: Secondary | ICD-10-CM | POA: Diagnosis not present

## 2016-10-20 DIAGNOSIS — G4733 Obstructive sleep apnea (adult) (pediatric): Secondary | ICD-10-CM | POA: Diagnosis not present

## 2016-11-01 ENCOUNTER — Other Ambulatory Visit: Payer: Self-pay | Admitting: Internal Medicine

## 2016-11-02 NOTE — Telephone Encounter (Signed)
Ok refill x 6 months 

## 2016-11-02 NOTE — Telephone Encounter (Signed)
Patient is requesting a refill on clonazepam 0.5mg . Last OV was on 03/03/16. Last refill was on 12/15/15 for 90tabs with 3 refills.   Is it ok for the patient to receive another refill? Thanks!

## 2016-11-20 DIAGNOSIS — G4752 REM sleep behavior disorder: Secondary | ICD-10-CM | POA: Diagnosis not present

## 2016-11-20 DIAGNOSIS — J430 Unilateral pulmonary emphysema [MacLeod's syndrome]: Secondary | ICD-10-CM | POA: Diagnosis not present

## 2016-11-20 DIAGNOSIS — J42 Unspecified chronic bronchitis: Secondary | ICD-10-CM | POA: Diagnosis not present

## 2016-11-20 DIAGNOSIS — J45909 Unspecified asthma, uncomplicated: Secondary | ICD-10-CM | POA: Diagnosis not present

## 2016-11-20 DIAGNOSIS — G4733 Obstructive sleep apnea (adult) (pediatric): Secondary | ICD-10-CM | POA: Diagnosis not present

## 2016-11-30 DIAGNOSIS — J449 Chronic obstructive pulmonary disease, unspecified: Secondary | ICD-10-CM | POA: Diagnosis not present

## 2016-11-30 DIAGNOSIS — E349 Endocrine disorder, unspecified: Secondary | ICD-10-CM | POA: Diagnosis not present

## 2016-11-30 DIAGNOSIS — D582 Other hemoglobinopathies: Secondary | ICD-10-CM | POA: Diagnosis not present

## 2016-11-30 DIAGNOSIS — R61 Generalized hyperhidrosis: Secondary | ICD-10-CM | POA: Diagnosis not present

## 2016-11-30 DIAGNOSIS — E663 Overweight: Secondary | ICD-10-CM | POA: Diagnosis not present

## 2016-11-30 DIAGNOSIS — E785 Hyperlipidemia, unspecified: Secondary | ICD-10-CM | POA: Diagnosis not present

## 2016-11-30 DIAGNOSIS — Z1389 Encounter for screening for other disorder: Secondary | ICD-10-CM | POA: Diagnosis not present

## 2016-12-18 DIAGNOSIS — G4733 Obstructive sleep apnea (adult) (pediatric): Secondary | ICD-10-CM | POA: Diagnosis not present

## 2016-12-18 DIAGNOSIS — J42 Unspecified chronic bronchitis: Secondary | ICD-10-CM | POA: Diagnosis not present

## 2016-12-18 DIAGNOSIS — J45909 Unspecified asthma, uncomplicated: Secondary | ICD-10-CM | POA: Diagnosis not present

## 2016-12-18 DIAGNOSIS — J430 Unilateral pulmonary emphysema [MacLeod's syndrome]: Secondary | ICD-10-CM | POA: Diagnosis not present

## 2016-12-18 DIAGNOSIS — G4752 REM sleep behavior disorder: Secondary | ICD-10-CM | POA: Diagnosis not present

## 2016-12-29 DIAGNOSIS — R61 Generalized hyperhidrosis: Secondary | ICD-10-CM | POA: Diagnosis not present

## 2016-12-29 DIAGNOSIS — J208 Acute bronchitis due to other specified organisms: Secondary | ICD-10-CM | POA: Diagnosis not present

## 2017-01-13 DIAGNOSIS — L700 Acne vulgaris: Secondary | ICD-10-CM | POA: Diagnosis not present

## 2017-01-18 DIAGNOSIS — G4752 REM sleep behavior disorder: Secondary | ICD-10-CM | POA: Diagnosis not present

## 2017-01-18 DIAGNOSIS — J430 Unilateral pulmonary emphysema [MacLeod's syndrome]: Secondary | ICD-10-CM | POA: Diagnosis not present

## 2017-01-18 DIAGNOSIS — G4733 Obstructive sleep apnea (adult) (pediatric): Secondary | ICD-10-CM | POA: Diagnosis not present

## 2017-01-18 DIAGNOSIS — J42 Unspecified chronic bronchitis: Secondary | ICD-10-CM | POA: Diagnosis not present

## 2017-01-18 DIAGNOSIS — J45909 Unspecified asthma, uncomplicated: Secondary | ICD-10-CM | POA: Diagnosis not present

## 2017-02-13 DIAGNOSIS — Z0000 Encounter for general adult medical examination without abnormal findings: Secondary | ICD-10-CM | POA: Diagnosis not present

## 2017-02-13 DIAGNOSIS — Z6831 Body mass index (BMI) 31.0-31.9, adult: Secondary | ICD-10-CM | POA: Diagnosis not present

## 2017-02-13 DIAGNOSIS — Z1389 Encounter for screening for other disorder: Secondary | ICD-10-CM | POA: Diagnosis not present

## 2017-02-13 DIAGNOSIS — Z125 Encounter for screening for malignant neoplasm of prostate: Secondary | ICD-10-CM | POA: Diagnosis not present

## 2017-02-13 DIAGNOSIS — Z136 Encounter for screening for cardiovascular disorders: Secondary | ICD-10-CM | POA: Diagnosis not present

## 2017-02-13 DIAGNOSIS — E785 Hyperlipidemia, unspecified: Secondary | ICD-10-CM | POA: Diagnosis not present

## 2017-02-13 DIAGNOSIS — Z9181 History of falling: Secondary | ICD-10-CM | POA: Diagnosis not present

## 2017-02-17 DIAGNOSIS — J42 Unspecified chronic bronchitis: Secondary | ICD-10-CM | POA: Diagnosis not present

## 2017-02-17 DIAGNOSIS — J430 Unilateral pulmonary emphysema [MacLeod's syndrome]: Secondary | ICD-10-CM | POA: Diagnosis not present

## 2017-02-17 DIAGNOSIS — G4733 Obstructive sleep apnea (adult) (pediatric): Secondary | ICD-10-CM | POA: Diagnosis not present

## 2017-02-17 DIAGNOSIS — J45909 Unspecified asthma, uncomplicated: Secondary | ICD-10-CM | POA: Diagnosis not present

## 2017-02-17 DIAGNOSIS — G4752 REM sleep behavior disorder: Secondary | ICD-10-CM | POA: Diagnosis not present

## 2017-02-24 DIAGNOSIS — R61 Generalized hyperhidrosis: Secondary | ICD-10-CM | POA: Diagnosis not present

## 2017-02-24 DIAGNOSIS — E785 Hyperlipidemia, unspecified: Secondary | ICD-10-CM | POA: Diagnosis not present

## 2017-02-24 DIAGNOSIS — Z125 Encounter for screening for malignant neoplasm of prostate: Secondary | ICD-10-CM | POA: Diagnosis not present

## 2017-02-24 DIAGNOSIS — D582 Other hemoglobinopathies: Secondary | ICD-10-CM | POA: Diagnosis not present

## 2017-03-20 DIAGNOSIS — G4733 Obstructive sleep apnea (adult) (pediatric): Secondary | ICD-10-CM | POA: Diagnosis not present

## 2017-03-20 DIAGNOSIS — J42 Unspecified chronic bronchitis: Secondary | ICD-10-CM | POA: Diagnosis not present

## 2017-03-20 DIAGNOSIS — J430 Unilateral pulmonary emphysema [MacLeod's syndrome]: Secondary | ICD-10-CM | POA: Diagnosis not present

## 2017-03-20 DIAGNOSIS — J45909 Unspecified asthma, uncomplicated: Secondary | ICD-10-CM | POA: Diagnosis not present

## 2017-03-20 DIAGNOSIS — G4752 REM sleep behavior disorder: Secondary | ICD-10-CM | POA: Diagnosis not present

## 2017-03-22 ENCOUNTER — Ambulatory Visit (INDEPENDENT_AMBULATORY_CARE_PROVIDER_SITE_OTHER): Payer: PPO | Admitting: Neurology

## 2017-03-22 ENCOUNTER — Encounter: Payer: Self-pay | Admitting: Neurology

## 2017-03-22 VITALS — BP 166/95 | HR 66 | Ht 70.0 in | Wt 213.5 lb

## 2017-03-22 DIAGNOSIS — G250 Essential tremor: Secondary | ICD-10-CM

## 2017-03-22 MED ORDER — ALPRAZOLAM 0.25 MG PO TABS: 0.2500 mg | ORAL_TABLET | Freq: Three times a day (TID) | ORAL | 0 refills | Status: DC | PRN

## 2017-03-22 NOTE — Progress Notes (Signed)
Reason for visit: Essential tremor  Daniel Terrell is an 71 y.o. male  History of present illness:  Daniel Terrell is a 71 year old right-handed white male with a history of an essential tremor affecting both upper extremities. The patient is on propranolol which she is tolerating fairly well, he has a history of asthma but he does not believe propranolol worsens this issue. 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 The patient is unable to play the guitar because of the tremor, otherwise he has not given up any activities of daily living. He returns to the office today for an evaluation.  Past Medical History:  Diagnosis Date  . Arthritis   . Asthma   . Colon polyps   . Complication of anesthesia   . COPD (chronic obstructive pulmonary disease) ([place])   . Hypertriglyceridemia   . IBS (irritable bowel syndrome)   . OCD (obsessive compulsive disorder)   . PONV (postoperative nausea and vomiting)   . Prostate cancer ([place])   . Sleep apnea     Past Surgical History:  Procedure Laterality Date  . BLADDER SURGERY     extension  . FINGER SURGERY     left x 3  . GREAT TOE ARTHRODESIS, INTERPHALANGEAL JOINT    . KNEE ARTHROSCOPY     left  . PROSTATECTOMY    . TONSILLECTOMY      Family History  Problem Relation Age of Onset  . Colon polyps Mother        son  . Tremor Mother   . Breast cancer Maternal Grandmother   . Colon cancer Maternal Grandfather     Social history:  reports that he quit smoking about 46 years ago. His smoking use included Cigarettes. He has never used smokeless tobacco. He reports that he does not drink alcohol or use drugs.    Allergies  Allergen Reactions  . Amoxicillin   . Avelox [Moxifloxacin]   . Levofloxacin     REACTION: disorientation   . Ofloxacin     REACTION: disorientation      Medications:    Prior to Admission medications   Medication Sig Start Date End Date Taking? Authorizing Provider  albuterol (PROVENTIL HFA;VENTOLIN HFA) 108 (90 BASE) MCG/ACT inhaler Inhale 1-2 puffs into the lungs every 6 (six) hours as needed for wheezing or shortness of breath.   Yes Provider, Historical, Terrell  Armodafinil 150 MG tablet Take 1 tablet (150 mg total) by mouth daily. 08/16/16  Yes Daniel Terrell  aspirin 81 MG tablet Take 81 mg by mouth daily.     Yes Provider, Historical, Terrell  budesonide (PULMICORT FLEXHALER) 180 MCG/ACT inhaler Inhale 2 puffs into the lungs 2 (two) times daily. 03/03/16  Yes Daniel Terrell  cetirizine (ZYRTEC) 10 MG tablet Take 10 mg by mouth daily.     Yes Provider, Historical, Terrell  Choline Fenofibrate (FENOFIBRIC ACID) 135 MG CPDR Take 1 capsule by mouth daily.  10/15/13  Yes Provider, Historical, Terrell  clonazePAM (KLONOPIN) 0.5 MG tablet TAKE 1 TO 2 TABLETS BY MOUTH AT BEDTIME AS DIRECTED 11/02/16  Yes Daniel Terrell  doxycycline (PERIOSTAT) 20 MG tablet Take 10 mg by mouth 2 (two) times daily. 02/24/17  Yes Provider, Historical, Terrell  glucosamine-chondroitin 500-400 MG tablet Take 1 tablet by mouth 2 (two) times daily.     Yes  Provider, Historical, Terrell  montelukast (SINGULAIR) 10 MG tablet Take 10 mg by mouth daily.     Yes Provider, Historical, Terrell  Multiple Vitamins-Minerals (MULTIVITAL PO) Take 1 tablet by mouth daily.     Yes Provider, Historical, Terrell  PARoxetine (PAXIL) 30 MG tablet Take 60 mg by mouth daily.    Yes Provider, Historical, Terrell  PRESCRIPTION MEDICATION Nocturnal oxygen 2 L at night. Per Dr. Keturah Terrell   Yes Provider, Historical, Terrell  propranolol ER (INDERAL LA) 80 MG 24 hr capsule Take 1 capsule (80 mg total) by mouth daily. 07/25/16  Yes Daniel Terrell  Spacer/Aero-Holding Chambers (AEROCHAMBER PLUS) inhaler Use as instructed 06/03/14  Yes Daniel Terrell  testosterone cypionate (DEPOTESTOTERONE CYPIONATE) 200 MG/ML injection Inject 200 mg  into the muscle every 21 ( twenty-one) days.  10/31/13  Yes Provider, Historical, Terrell  fluticasone (FLONASE) 50 MCG/ACT nasal spray Place 2 sprays into the nose daily as needed. 09/20/12 03/03/16  Daniel Terrell    ROS:  Out of a complete 14 system review of symptoms, the patient complains only of the following symptoms, and all other reviewed systems are negative.  Excessive sweating Cough, wheezing Sleep apnea, daytime sleepiness, snoring Tremors  Blood pressure (!) 166/95, pulse 66, height 5\' 10"  (1.778 m), weight 213 lb 8 oz (96.8 kg).  Physical Exam  General: The patient is alert and cooperative at the time of the examination.  Skin: No significant peripheral edema is noted.   Neurologic Exam  Mental status: The patient is alert and oriented x 3 at the time of the examination. The patient has apparent normal recent and remote memory, with an apparently normal attention span and concentration ability.   Cranial nerves: Facial symmetry is present. Speech is normal, no aphasia or dysarthria is noted. Extraocular movements are full. Visual fields are full.  Motor: The patient has good strength in all 4 extremities.  Sensory examination: Soft touch sensation is symmetric on the face, arms, and legs.  Coordination: The patient has good finger-nose-finger and heel-to-shin bilaterally. Intention tremors are seen with finger-nose-finger bilaterally. The patient is able to draw a spiral without much tremor translated into handwriting.  Gait and station: The patient has a normal gait. Tandem gait is normal. Romberg is negative. No drift is seen.  Reflexes: Deep tendon reflexes are symmetric.   Assessment/Plan:  1. Essential tremor  The patient will continue the propranolol for now, his asthma is not affected by this apparently. The patient was given a prescription for low-dose alprazolam to take if needed for the tremor. He will call me if he believes that this is effective and  tolerated. He was given a prescription for the 0.25 mg tablets. He will follow-up in 6 months. He does not wish to consider deep brain stimulation at this time.  Daniel Alexanders Terrell 03/22/2017 10:36 AM  [facility] [address] [place], [place] [address]  Phone [phone] Fax [phone]

## 2017-03-30 ENCOUNTER — Other Ambulatory Visit: Payer: Self-pay | Admitting: Internal Medicine

## 2017-03-30 NOTE — Telephone Encounter (Signed)
Ok to refill 

## 2017-03-30 NOTE — Telephone Encounter (Signed)
CY Please advise on refill. Thanks.  

## 2017-04-19 DIAGNOSIS — J430 Unilateral pulmonary emphysema [MacLeod's syndrome]: Secondary | ICD-10-CM | POA: Diagnosis not present

## 2017-04-19 DIAGNOSIS — G4752 REM sleep behavior disorder: Secondary | ICD-10-CM | POA: Diagnosis not present

## 2017-04-19 DIAGNOSIS — G4733 Obstructive sleep apnea (adult) (pediatric): Secondary | ICD-10-CM | POA: Diagnosis not present

## 2017-04-19 DIAGNOSIS — J42 Unspecified chronic bronchitis: Secondary | ICD-10-CM | POA: Diagnosis not present

## 2017-04-19 DIAGNOSIS — J45909 Unspecified asthma, uncomplicated: Secondary | ICD-10-CM | POA: Diagnosis not present

## 2017-05-03 ENCOUNTER — Other Ambulatory Visit: Payer: Self-pay | Admitting: Internal Medicine

## 2017-05-03 NOTE — Telephone Encounter (Signed)
Ok to refill total 6 months 

## 2017-05-03 NOTE — Telephone Encounter (Signed)
CY please advise on refill. Thanks. 

## 2017-05-04 NOTE — Telephone Encounter (Signed)
Called pharmacy and spoke with [facility] regarding pt needing refill on Armodafinil that was okayed by Dr. Annamaria Boots. Lonn Georgia said she would take care of refilling the med for pt.  Nothing further needed.

## 2017-05-20 DIAGNOSIS — G4733 Obstructive sleep apnea (adult) (pediatric): Secondary | ICD-10-CM | POA: Diagnosis not present

## 2017-05-20 DIAGNOSIS — J42 Unspecified chronic bronchitis: Secondary | ICD-10-CM | POA: Diagnosis not present

## 2017-05-20 DIAGNOSIS — J430 Unilateral pulmonary emphysema [MacLeod's syndrome]: Secondary | ICD-10-CM | POA: Diagnosis not present

## 2017-05-20 DIAGNOSIS — G4752 REM sleep behavior disorder: Secondary | ICD-10-CM | POA: Diagnosis not present

## 2017-05-20 DIAGNOSIS — J45909 Unspecified asthma, uncomplicated: Secondary | ICD-10-CM | POA: Diagnosis not present

## 2017-06-20 DIAGNOSIS — J45909 Unspecified asthma, uncomplicated: Secondary | ICD-10-CM | POA: Diagnosis not present

## 2017-06-20 DIAGNOSIS — J430 Unilateral pulmonary emphysema [MacLeod's syndrome]: Secondary | ICD-10-CM | POA: Diagnosis not present

## 2017-06-20 DIAGNOSIS — G4752 REM sleep behavior disorder: Secondary | ICD-10-CM | POA: Diagnosis not present

## 2017-06-20 DIAGNOSIS — G4733 Obstructive sleep apnea (adult) (pediatric): Secondary | ICD-10-CM | POA: Diagnosis not present

## 2017-06-20 DIAGNOSIS — J42 Unspecified chronic bronchitis: Secondary | ICD-10-CM | POA: Diagnosis not present

## 2017-07-20 DIAGNOSIS — J45909 Unspecified asthma, uncomplicated: Secondary | ICD-10-CM | POA: Diagnosis not present

## 2017-07-20 DIAGNOSIS — J42 Unspecified chronic bronchitis: Secondary | ICD-10-CM | POA: Diagnosis not present

## 2017-07-20 DIAGNOSIS — J430 Unilateral pulmonary emphysema [MacLeod's syndrome]: Secondary | ICD-10-CM | POA: Diagnosis not present

## 2017-07-20 DIAGNOSIS — G4752 REM sleep behavior disorder: Secondary | ICD-10-CM | POA: Diagnosis not present

## 2017-07-20 DIAGNOSIS — G4733 Obstructive sleep apnea (adult) (pediatric): Secondary | ICD-10-CM | POA: Diagnosis not present

## 2017-07-24 ENCOUNTER — Other Ambulatory Visit: Payer: Self-pay | Admitting: Internal Medicine

## 2017-07-24 NOTE — Telephone Encounter (Signed)
Ok to refill x 6 months 

## 2017-07-24 NOTE — Telephone Encounter (Signed)
Please advise on refill. Thanks. 

## 2017-07-26 ENCOUNTER — Ambulatory Visit: Payer: PPO | Admitting: Neurology

## 2017-08-02 DIAGNOSIS — N5201 Erectile dysfunction due to arterial insufficiency: Secondary | ICD-10-CM | POA: Diagnosis not present

## 2017-08-02 DIAGNOSIS — Z8546 Personal history of malignant neoplasm of prostate: Secondary | ICD-10-CM | POA: Diagnosis not present

## 2017-08-02 DIAGNOSIS — E291 Testicular hypofunction: Secondary | ICD-10-CM | POA: Diagnosis not present

## 2017-08-04 DIAGNOSIS — M19071 Primary osteoarthritis, right ankle and foot: Secondary | ICD-10-CM | POA: Diagnosis not present

## 2017-08-04 DIAGNOSIS — Z6829 Body mass index (BMI) 29.0-29.9, adult: Secondary | ICD-10-CM | POA: Diagnosis not present

## 2017-08-04 DIAGNOSIS — S99921A Unspecified injury of right foot, initial encounter: Secondary | ICD-10-CM | POA: Diagnosis not present

## 2017-08-04 DIAGNOSIS — M7989 Other specified soft tissue disorders: Secondary | ICD-10-CM | POA: Diagnosis not present

## 2017-08-04 DIAGNOSIS — M79674 Pain in right toe(s): Secondary | ICD-10-CM | POA: Diagnosis not present

## 2017-08-20 DIAGNOSIS — J42 Unspecified chronic bronchitis: Secondary | ICD-10-CM | POA: Diagnosis not present

## 2017-08-20 DIAGNOSIS — G4733 Obstructive sleep apnea (adult) (pediatric): Secondary | ICD-10-CM | POA: Diagnosis not present

## 2017-08-20 DIAGNOSIS — G4752 REM sleep behavior disorder: Secondary | ICD-10-CM | POA: Diagnosis not present

## 2017-08-20 DIAGNOSIS — J45909 Unspecified asthma, uncomplicated: Secondary | ICD-10-CM | POA: Diagnosis not present

## 2017-08-20 DIAGNOSIS — J430 Unilateral pulmonary emphysema [MacLeod's syndrome]: Secondary | ICD-10-CM | POA: Diagnosis not present

## 2017-08-22 DIAGNOSIS — E291 Testicular hypofunction: Secondary | ICD-10-CM | POA: Diagnosis not present

## 2017-08-28 DIAGNOSIS — J309 Allergic rhinitis, unspecified: Secondary | ICD-10-CM | POA: Diagnosis not present

## 2017-08-28 DIAGNOSIS — D582 Other hemoglobinopathies: Secondary | ICD-10-CM | POA: Diagnosis not present

## 2017-08-28 DIAGNOSIS — R509 Fever, unspecified: Secondary | ICD-10-CM | POA: Diagnosis not present

## 2017-08-28 DIAGNOSIS — E785 Hyperlipidemia, unspecified: Secondary | ICD-10-CM | POA: Diagnosis not present

## 2017-08-28 DIAGNOSIS — J208 Acute bronchitis due to other specified organisms: Secondary | ICD-10-CM | POA: Diagnosis not present

## 2017-09-07 DIAGNOSIS — R531 Weakness: Secondary | ICD-10-CM | POA: Diagnosis not present

## 2017-09-07 DIAGNOSIS — J208 Acute bronchitis due to other specified organisms: Secondary | ICD-10-CM | POA: Diagnosis not present

## 2017-09-07 DIAGNOSIS — E538 Deficiency of other specified B group vitamins: Secondary | ICD-10-CM | POA: Diagnosis not present

## 2017-09-13 NOTE — Progress Notes (Signed)
[facility]  PATIENT: SHAUGHN THOMLEY DOB: 1946/08/07   REASON FOR VISIT: Follow-up for essential tremor HISTORY FROM: Patient    HISTORY OF PRESENT ILLNESS:UPDATE 12/20/2018CM [name], 71 year old male returns for follow-up with history of essential tremor affecting both upper extremities.  The patient is on propanolol and tolerating it well.  His asthma has not worsened.  He has failed Topamax in the past due to excessive drowsiness and he could not tolerate Mysoline.  He is on Klonopin by Dr. Annamaria Boots, his  Pulmonary physician .  Blood pressure noted to be elevated in the office at 150/87.  Patient claims he took hemp oil and that helped his tremor but is it is expensive.  Except for playing the guitar his tremor does not interfere with other activities of daily living.  He returns today for evaluation.   03/22/17 KWMr. Ries is a 71 year old right-handed white male with a history of an essential tremor affecting both upper extremities. The patient is on propranolol which he is tolerating fairly well, he has a history of asthma but he does not believe propranolol worsens this issue. 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 The patient is unable to play the guitar because of the tremor, otherwise he has not given up any activities of daily living. He returns to the office today for an evaluation. REVIEW OF SYSTEMS: Full 14 system review of systems performed and notable only for those listed, all others are neg:  Constitutional: neg  Cardiovascular: neg Ear/Nose/Throat: neg  Skin: neg Eyes: neg Respiratory: neg Gastroitestinal: neg  Hematology/Lymphatic: neg  Endocrine: Intolerance to heat Musculoskeletal:neg Allergy/Immunology: neg Neurological: Essential tremor Psychiatric: neg Sleep : Restless  leg   ALLERGIES: Allergies  Allergen Reactions  . Amoxicillin   . Avelox [Moxifloxacin]   . Levofloxacin     REACTION: disorientation   . Ofloxacin     REACTION: disorientation    . Topamax [Topiramate]     drowsy    HOME MEDICATIONS: Outpatient Medications Prior to Visit  Medication Sig Dispense Refill  . albuterol (PROVENTIL HFA;VENTOLIN HFA) 108 (90 BASE) MCG/ACT inhaler Inhale 1-2 puffs into the lungs every 6 (six) hours as needed for wheezing or shortness of breath.    . anastrozole (ARIMIDEX) 1 MG tablet     . Armodafinil 150 MG tablet TAKE 1 TABLET BY MOUTH ONCE DAILY (Patient taking differently: TAKE 1 TABLET BY MOUTH ONCE DAILY PRN) 30 tablet 5  . aspirin 81 MG tablet Take 81 mg by mouth daily.      . cetirizine (ZYRTEC) 10 MG tablet Take 10 mg by mouth daily.      . Choline Fenofibrate (FENOFIBRIC ACID) 135 MG CPDR Take 1 capsule by mouth daily.     . clonazePAM (KLONOPIN) 0.5 MG tablet TAKE 1-2 TABLETS BY MOUTH EVERY NIGHT. 60 tablet 5  . doxycycline (PERIOSTAT) 20 MG tablet Take 10 mg by mouth 2 (two) times daily.    [name] glucosamine-chondroitin 500-400 MG tablet Take 1 tablet by mouth 2 (two) times daily.      . mometasone (ASMANEX) 220 MCG/INH inhaler Inhale 2 puffs into the lungs 2 (two) times daily.    . montelukast (SINGULAIR) 10 MG tablet Take 10 mg by mouth daily.      . Multiple Vitamins-Minerals (MULTIVITAL PO) Take 1 tablet by mouth daily.      [name]  PARoxetine (PAXIL) 30 MG tablet Take 60 mg by mouth daily.     [name] PRESCRIPTION MEDICATION Nocturnal oxygen 2 L at night. Per Dr. Keturah Barre    . Spacer/Aero-Holding Chambers (AEROCHAMBER PLUS) inhaler Use as instructed 1 each 2  . ALPRAZolam (XANAX) 0.25 MG tablet Take 1 tablet (0.25 mg total) by mouth 3 (three) times daily as needed for anxiety. 30 tablet 0  . budesonide (PULMICORT FLEXHALER) 180 MCG/ACT inhaler Inhale 2 puffs into the lungs 2 (two) times daily. 1 Inhaler 0  . propranolol ER (INDERAL LA) 80 MG 24 hr  capsule Take 1 capsule (80 mg total) by mouth daily. 90 capsule 3  . fluticasone (FLONASE) 50 MCG/ACT nasal spray Place 2 sprays into the nose daily as needed.    . testosterone cypionate (DEPOTESTOTERONE CYPIONATE) 200 MG/ML injection Inject 200 mg into the muscle every 21 ( twenty-one) days.      No facility-administered medications prior to visit.     PAST MEDICAL HISTORY: Past Medical History:  Diagnosis Date  . Arthritis   . Asthma   . Colon polyps   . Complication of anesthesia   . COPD (chronic obstructive pulmonary disease) ([place])   . Hypertriglyceridemia   . IBS (irritable bowel syndrome)   . OCD (obsessive compulsive disorder)   . PONV (postoperative nausea and vomiting)   . Prostate cancer ([place])   . Sleep apnea     PAST SURGICAL HISTORY: Past Surgical History:  Procedure Laterality Date  . BLADDER SURGERY     extension  . FINGER SURGERY     left x 3  . GREAT TOE ARTHRODESIS, INTERPHALANGEAL JOINT    . KNEE ARTHROSCOPY     left  . PROSTATECTOMY    . TONSILLECTOMY      FAMILY HISTORY: Family History  Problem Relation Age of Onset  . Colon polyps Mother        son  . Tremor Mother   . Breast cancer Maternal Grandmother   . Colon cancer Maternal Grandfather     SOCIAL HISTORY: Social History   Socioeconomic History  . Marital status: Married    Spouse name: Denice Paradise  . Number of children: 3  . Years of education: BA  . Highest education level: Not on file  Social Needs  . Financial resource strain: Not on file  . Food insecurity - worry: Not on file  . Food insecurity - inability: Not on file  . Transportation needs - medical: Not on file  . Transportation needs - non-medical: Not on file  Occupational History  . Occupation: Theme park manager  . Occupation: Retired  Tobacco Use  . Smoking status: Former Smoker    Types: Cigarettes    Last attempt to quit: 09/26/1970    Years since quitting: 47.0  . Smokeless tobacco: Never Used  Substance and Sexual Activity   . Alcohol use: No  . Drug use: No  . Sexual activity: Not on file  Other Topics Concern  . Not on file  Social History Narrative   Patient is married with 3 children.   Patient is right handed.   Patient has a Bachelor's degree.   Patient drinks 2-3 cups daily.        PHYSICAL EXAM  Vitals:   09/14/17 1248  BP: (!) 150/87  Pulse: 74  Weight: 203 lb 6.4 oz (92.3 kg)  Height: 5\' 10"  (1.778 m)   Body mass index is 29.18 kg/m.  Generalized: Well developed, in no acute distress  Head: normocephalic and atraumatic,. Oropharynx benign  Neck: Supple,  Musculoskeletal: No deformity   Neurological examination   Mentation: Alert oriented to time, place, history taking. Attention span and concentration appropriate. Recent and remote memory intact.  Follows all commands speech and language fluent.   Cranial nerve II-XII: Fundoscopic exam reveals sharp disc margins.Pupils were equal round reactive to light extraocular movements were full, visual field were full on confrontational test. Facial sensation and strength were normal. hearing was intact to finger rubbing bilaterally. Uvula tongue midline. head turning and shoulder shrug were normal and symmetric.Tongue protrusion into cheek strength was normal. Motor: normal bulk and tone, full strength in the BUE, BLE, Sensory: normal and symmetric to light touch,  Coordination: finger-nose-finger, heel-to-shin bilaterally, intention tremors are seen with finger to nose bilaterally Reflexes: Symmetric upper and lower plantar responses were flexor bilaterally. Gait and Station: Rising up from seated position without assistance, normal stance,  moderate stride, good arm swing, smooth turning, able to perform tiptoe, and heel walking without difficulty. Tandem gait is steady  DIAGNOSTIC DATA (LABS, IMAGING, TESTING) - I reviewed patient records, labs, notes, testing and imaging myself where available.       ASSESSMENT AND PLAN  71 y.o.  year old male  has a past medical history of Essential tremor.  His asthma so far has not been affected by the propanolol.  His blood pressure was noted to be elevated today.   Increase propanolol 120 mg daily Call if you have any problems with your asthma so far this is not been affected Follow-up in 6 months Dennie Bible, [facility], [facility], [facility] [address] [place], [place] [address] [phone]

## 2017-09-14 ENCOUNTER — Encounter: Payer: Self-pay | Admitting: Nurse Practitioner

## 2017-09-14 ENCOUNTER — Ambulatory Visit (INDEPENDENT_AMBULATORY_CARE_PROVIDER_SITE_OTHER): Payer: PPO | Admitting: Nurse Practitioner

## 2017-09-14 VITALS — BP 150/87 | HR 74 | Ht 70.0 in | Wt 203.4 lb

## 2017-09-14 DIAGNOSIS — G250 Essential tremor: Secondary | ICD-10-CM | POA: Diagnosis not present

## 2017-09-14 MED ORDER — PROPRANOLOL HCL ER 120 MG PO CP24: 120.0000 mg | ORAL_CAPSULE | Freq: Every day | ORAL | 3 refills | Status: DC

## 2017-09-14 NOTE — Patient Instructions (Signed)
Increase propanolol 120 mg daily Call if you have any problems with your asthma so far this is not been affected Follow-up in 6 months

## 2017-09-19 DIAGNOSIS — J430 Unilateral pulmonary emphysema [MacLeod's syndrome]: Secondary | ICD-10-CM | POA: Diagnosis not present

## 2017-09-19 DIAGNOSIS — J42 Unspecified chronic bronchitis: Secondary | ICD-10-CM | POA: Diagnosis not present

## 2017-09-19 DIAGNOSIS — J45909 Unspecified asthma, uncomplicated: Secondary | ICD-10-CM | POA: Diagnosis not present

## 2017-09-19 DIAGNOSIS — G4752 REM sleep behavior disorder: Secondary | ICD-10-CM | POA: Diagnosis not present

## 2017-09-19 DIAGNOSIS — G4733 Obstructive sleep apnea (adult) (pediatric): Secondary | ICD-10-CM | POA: Diagnosis not present

## 2017-10-09 DIAGNOSIS — E291 Testicular hypofunction: Secondary | ICD-10-CM | POA: Diagnosis not present

## 2017-10-11 DIAGNOSIS — Z23 Encounter for immunization: Secondary | ICD-10-CM | POA: Diagnosis not present

## 2017-10-17 ENCOUNTER — Telehealth: Payer: Self-pay | Admitting: Nurse Practitioner

## 2017-10-17 MED ORDER — PROPRANOLOL HCL ER 80 MG PO CP24: 80.0000 mg | ORAL_CAPSULE | Freq: Every day | ORAL | 6 refills | Status: DC

## 2017-10-17 NOTE — Telephone Encounter (Signed)
Spoke with patient to advise, and he asked if [facility] sent in 90 days. This RN advised she did not. He stated that he prefers 90 days as he gets a discount. He pays co pay for 2 months and gets other month 'free. This RN stated will call pharmacy and change to 90 day supply. Patient verbalized understanding, appreciation.  [place], spoke with Mendel Ryder. Gave her verbal to dispense 90 day supply with one refill. Mendel Ryder verbalized understanding.

## 2017-10-17 NOTE — Telephone Encounter (Signed)
Pt called he is having dizzy spells since increasing propranolol ER (INDERAL LA) 120 MG 24 hr capsule. He is wanting to drop back down to 80mg . Please call to advise. Pharmacy: [place]

## 2017-10-17 NOTE — Telephone Encounter (Signed)
Inderal LA reduced to 80 mg and called to the pharmacy.  Please call the patient

## 2017-10-20 DIAGNOSIS — J430 Unilateral pulmonary emphysema [MacLeod's syndrome]: Secondary | ICD-10-CM | POA: Diagnosis not present

## 2017-10-20 DIAGNOSIS — G4752 REM sleep behavior disorder: Secondary | ICD-10-CM | POA: Diagnosis not present

## 2017-10-20 DIAGNOSIS — G4733 Obstructive sleep apnea (adult) (pediatric): Secondary | ICD-10-CM | POA: Diagnosis not present

## 2017-10-20 DIAGNOSIS — J45909 Unspecified asthma, uncomplicated: Secondary | ICD-10-CM | POA: Diagnosis not present

## 2017-10-20 DIAGNOSIS — J42 Unspecified chronic bronchitis: Secondary | ICD-10-CM | POA: Diagnosis not present

## 2017-11-02 DIAGNOSIS — E291 Testicular hypofunction: Secondary | ICD-10-CM | POA: Diagnosis not present

## 2017-11-17 DIAGNOSIS — J208 Acute bronchitis due to other specified organisms: Secondary | ICD-10-CM | POA: Diagnosis not present

## 2017-11-20 DIAGNOSIS — J430 Unilateral pulmonary emphysema [MacLeod's syndrome]: Secondary | ICD-10-CM | POA: Diagnosis not present

## 2017-11-20 DIAGNOSIS — J45909 Unspecified asthma, uncomplicated: Secondary | ICD-10-CM | POA: Diagnosis not present

## 2017-11-20 DIAGNOSIS — G4752 REM sleep behavior disorder: Secondary | ICD-10-CM | POA: Diagnosis not present

## 2017-11-20 DIAGNOSIS — G4733 Obstructive sleep apnea (adult) (pediatric): Secondary | ICD-10-CM | POA: Diagnosis not present

## 2017-11-20 DIAGNOSIS — J42 Unspecified chronic bronchitis: Secondary | ICD-10-CM | POA: Diagnosis not present

## 2017-11-23 ENCOUNTER — Telehealth: Payer: Self-pay | Admitting: Internal Medicine

## 2017-11-23 DIAGNOSIS — J208 Acute bronchitis due to other specified organisms: Secondary | ICD-10-CM | POA: Diagnosis not present

## 2017-11-23 DIAGNOSIS — K219 Gastro-esophageal reflux disease without esophagitis: Secondary | ICD-10-CM | POA: Diagnosis not present

## 2017-11-23 DIAGNOSIS — R05 Cough: Secondary | ICD-10-CM | POA: Diagnosis not present

## 2017-11-23 NOTE — Telephone Encounter (Signed)
Spoke with patient. He is refusing to see the NP. Only wants to see CY.

## 2017-11-23 NOTE — Telephone Encounter (Signed)
Spoke with patient's wife. She stated that the patient was recently diagnosed with PNA by his PCP Dr. Delena Bali. Per Pricilla Holm, Dr. Delena Bali advised patient to follow up with CY as soon as possible. Next available appt is not until June 2019.   Patient was last seen in 2017 for asthma. Did not follow up in 2018.   Pricilla Holm wants to know if there is anyway possible that the patient can be seen sooner by CY.   CY and Katie, please advise. Thanks!

## 2017-11-23 NOTE — Telephone Encounter (Signed)
Katie- If we can't seen him in next 2 weeks, then NP?

## 2017-11-24 NOTE — Telephone Encounter (Signed)
Please have patient come in on Wednesday March 6,2019 at 11:30am. Also, inform patient there may be a longer wait time than usual as we are adding him to the schedule. Thanks.

## 2017-11-24 NOTE — Telephone Encounter (Signed)
Spoke with Daniel Terrell and advised appt time and date. Nohing further needed.

## 2017-11-29 ENCOUNTER — Encounter: Payer: Self-pay | Admitting: Internal Medicine

## 2017-11-29 ENCOUNTER — Ambulatory Visit: Payer: PPO | Admitting: Internal Medicine

## 2017-11-29 DIAGNOSIS — G4733 Obstructive sleep apnea (adult) (pediatric): Secondary | ICD-10-CM

## 2017-11-29 DIAGNOSIS — J410 Simple chronic bronchitis: Secondary | ICD-10-CM | POA: Diagnosis not present

## 2017-11-29 MED ORDER — FLUTICASONE-UMECLIDIN-VILANT 100-62.5-25 MCG/INH IN AEPB: 1.0000 | INHALATION_SPRAY | Freq: Every day | RESPIRATORY_TRACT | 0 refills | Status: DC

## 2017-11-29 NOTE — Assessment & Plan Note (Signed)
Wife says his oral appliance prevents his snoring and he seems to sleep okay.

## 2017-11-29 NOTE — Patient Instructions (Signed)
Ok to use the nebulizer up to every 6 hours if needed  Sample Trelegy inhaler     Inhale 1 puff, then rinse mouth, once daily    Try this instead of Asmanex. You can still use the albuterol rescue inhaler if needed. When the sample runs out, go back to Asmanex.  Consider raising the head of your bed with a brick if there is any concern about reflux at night.,  Get the follow-up CXR as planned.  Finish the zithromax

## 2017-11-29 NOTE — Progress Notes (Signed)
Subjective:    Patient ID: Daniel Terrell, male    DOB: 1945/11/14, 72 y.o.   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 is on O2 2 L nightly . DME: [facility]. pt. Is doing fairly well  no complaints. 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 Adderall 20 mg XRfor alertness as before. 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 His PCP wanted him seen here. O2 2L sleep/ [facility]             Oral appliance for OSA stopped his snoring. Asmanex, Proventil HFA,     Nuvigil 150 At least 3 episodes of acute bronchitis, overlapping since early December.  Had been treated with Z-Pak, then doxycycline and now on a 10-day course of Zithromax 500 mg. CXR at [facility] 2/28 indicating a "little patch" of pneumonia left midlung zone, done at [place] with repeat scheduled 3/21.  Had a few chills.   No overt reflux or aspiration event but he was put on Prilosec, recognizing possibility that he has been aspirating. Using rescue inhaler about twice a day currently.  Robitussin with codeine for cough. He has been staying hoarse.  Not aware of postnasal drainage, sinus headache or sinusitis. Has had Pneumovax, and 2018 had Prevnar.  Review of  Systems- see HPI + = positive Constitutional:   No-   weight loss, night sweats, fevers, chills, fatigue, lassitude. HEENT:   No-  headaches, difficulty swallowing, tooth/dental problems, sore throat,        sneezing, itching, ear ache,  nasal congestion, post nasal drip,  CV:  No-   chest pain, orthopnea, PND, swelling in lower extremities, anasarca, dizziness, palpitations Resp: +shortness of breath with exertion or at rest.              Per HPI;  No-  coughing up of blood.              No-   change in color of mucus.  + wheezing.   Skin: No-   rash or lesions. GI:  No-   heartburn, indigestion, abdominal pain, nausea, vomiting,  GU:  MS:  No-   joint pain or swelling.   Neuro- nothing unusual Psych:  No- change in mood or affect. No depression or anxiety.  No memory loss. Objective:    OBJ- Physical Exam General- Alert, Oriented, Affect-appropriate, Distress- none acute Skin- rash-none, lesions- none, excoriation- none Lymphadenopathy- none Head- atraumatic            Eyes- Gross vision intact, PERRLA, conjunctivae and secretions clear            Ears- Hearing, canals-normal            Nose- Clear, no-Septal dev, mucus, polyps, erosion, perforation             Throat- Mallampati II , mucosa clear , drainage- none, tonsils- atrophic + hoarse without stridor Neck- flexible , trachea midline, no stridor , thyroid nl, carotid no bruit Chest - symmetrical excursion ,  unlabored           Heart/CV- RRR , no murmur , no gallop  , no rub, nl s1 s2                           - JVD- none , edema- none, stasis changes- none, varices- none           Lung- +coarse sounds L back, wheeze- none, cough+ , dullness-none, rub- none           Chest wall-  Abd-  Br/ Gen/ Rectal- Not done, not indicated Extrem- cyanosis- none, clubbing, none, atrophy- none, strength- nl Neuro- grossly intact to observation    Assessment & Plan:

## 2017-11-29 NOTE — Assessment & Plan Note (Signed)
Exacerbation of bronchitis with probable community-acquired pneumonia left lung based on his description of CXR.  Overlapping episodes of acute bronchitis is a common pattern at this time of year.  Initial illnesses were probably viral.  Small possibility he could have aspirated as part of this so we emphasized reflux precautions. Plan-finish doxycycline.  Use cough medicine if needed.  Sample Trelegy for trial, then go back to Asmanex.Keep appointment for f/u CXR.

## 2017-12-07 ENCOUNTER — Telehealth: Payer: Self-pay | Admitting: Internal Medicine

## 2017-12-07 MED ORDER — FLUTICASONE-UMECLIDIN-VILANT 100-62.5-25 MCG/INH IN AEPB: 1.0000 | INHALATION_SPRAY | Freq: Every day | RESPIRATORY_TRACT | 1 refills | Status: DC

## 2017-12-07 NOTE — Telephone Encounter (Signed)
Spoke with pt's wife, Denice Paradise. Pt is needing a prescription for Trelegy. Rx has been sent in. Nothing further was needed.

## 2017-12-08 ENCOUNTER — Telehealth: Payer: Self-pay | Admitting: Internal Medicine

## 2017-12-08 NOTE — Telephone Encounter (Signed)
Received a PA request from [place] for patient's Trelegy. PA has been started on [email or web address]. Key is [id number].   As of today, demographic information is still being processed by [facility].   Will route to Katie to look out for follow up.

## 2017-12-11 NOTE — Telephone Encounter (Signed)
The request was not sent to the plan.   [name] (Key: [id number]) - [id number]   Need help? Call us at [phone]   Status  Shared / Accessed Online(Not sent to plan) Next Steps Complete the request below, then click "Send to Plan."   Drug Trelegy Ellipta 100-62.5-25MCG/INH IN AEPB Form [facility] Medicare Non Formulary Exception (NFE) Request Form Prior Authorization for Non-Formulary Exception ([place]) Requests [phone] Original Claim Info [id number]

## 2017-12-12 NOTE — Telephone Encounter (Signed)
Pt is calling back [phone] 

## 2017-12-12 NOTE — Telephone Encounter (Signed)
[employer] requires a faxed PA for their medication. I faxed in the PA and will await response. I spoke with pt to let him know and advised Trelegy samples were left up front to pick up to hold him over until the decision is made. Will KW to follow up.

## 2017-12-13 NOTE — Telephone Encounter (Signed)
Evangela M. From [place] called regarding faxed received - advised it was a PA for Trelegy inhaler - she states that she will have her supervisor forward the prior auth to the pharmacy side as the fax was sent to the medical side. Asked for the pharmacy number for PA's and she didn't have it, but she did give me there phone number in case any follow up was needed - [phone]

## 2017-12-14 DIAGNOSIS — J189 Pneumonia, unspecified organism: Secondary | ICD-10-CM | POA: Diagnosis not present

## 2017-12-14 NOTE — Telephone Encounter (Signed)
Pt's wife Wille Glaser is calling back about Trelegy [phone]

## 2017-12-14 NOTE — Telephone Encounter (Signed)
Offer Anoro inhaler    # 1, inhale 1 puff, once daily   Ref x 12

## 2017-12-14 NOTE — Telephone Encounter (Signed)
Pt wife calling back with info requested  Of list of meds ins comp will cover she can be reached @ [phone] cell or @ hm [phone].Hillery [place]

## 2017-12-14 NOTE — Telephone Encounter (Signed)
Called and spoke with pt's spouse, Denice Paradise.  Denice Paradise states she contact health team regarding denial and was advised that pt had to try and fail alternatives.  Denice Paradise was unsure of alternatives, but states she will call insurance to get that information.  Will await call back.

## 2017-12-14 NOTE — Telephone Encounter (Signed)
Called and spoke with patients wife, she states that the alternatives are Spiriva, Advair Diskus, Anoro, Breo, Combivent. Patient has tried and failed all of these except the Anoro. CY please advise if you would like to change this, thanks.    Current Outpatient Medications on File Prior to Visit  Medication Sig Dispense Refill  . albuterol (PROVENTIL HFA;VENTOLIN HFA) 108 (90 BASE) MCG/ACT inhaler Inhale 1-2 puffs into the lungs every 6 (six) hours as needed for wheezing or shortness of breath.    . anastrozole (ARIMIDEX) 1 MG tablet     . Armodafinil 150 MG tablet TAKE 1 TABLET BY MOUTH ONCE DAILY (Patient taking differently: TAKE 1 TABLET BY MOUTH ONCE DAILY PRN) 30 tablet 5  . aspirin 81 MG tablet Take 81 mg by mouth daily.      . cetirizine (ZYRTEC) 10 MG tablet Take 10 mg by mouth daily.      . Choline Fenofibrate (FENOFIBRIC ACID) 135 MG CPDR Take 1 capsule by mouth daily.     . clonazePAM (KLONOPIN) 0.5 MG tablet TAKE 1-2 TABLETS BY MOUTH EVERY NIGHT. 60 tablet 5  . doxycycline (PERIOSTAT) 20 MG tablet Take 10 mg by mouth 2 (two) times daily as needed.     . fluticasone (FLONASE) 50 MCG/ACT nasal spray Place 2 sprays into the nose daily as needed.    . Fluticasone-Umeclidin-Vilant (TRELEGY ELLIPTA) 100-62.5-25 MCG/INH AEPB Inhale 1 puff into the lungs daily. 180 each 1  . glucosamine-chondroitin 500-400 MG tablet Take 1 tablet by mouth 2 (two) times daily.      . mometasone (ASMANEX) 220 MCG/INH inhaler Inhale 2 puffs into the lungs 2 (two) times daily.    . montelukast (SINGULAIR) 10 MG tablet Take 10 mg by mouth daily.      . Multiple Vitamins-Minerals (MULTIVITAL PO) Take 1 tablet by mouth daily.      [name] PARoxetine (PAXIL) 30 MG tablet Take 60 mg by mouth daily.     [name] PRESCRIPTION MEDICATION Nocturnal oxygen 2 L at night. Per Dr. Keturah Barre    . propranolol ER (INDERAL LA) 80 MG 24 hr capsule Take 1 capsule (80 mg total) by mouth daily. 30 capsule 6   No current  facility-administered medications on file prior to visit.     Allergies  Allergen Reactions  . Amoxicillin   . Avelox [Moxifloxacin]   . Levofloxacin     REACTION: disorientation   . Ofloxacin     REACTION: disorientation    . Topamax [Topiramate]     drowsy

## 2017-12-14 NOTE — Telephone Encounter (Signed)
Sent to plan 12/14/17

## 2017-12-15 MED ORDER — UMECLIDINIUM-VILANTEROL 62.5-25 MCG/INH IN AEPB: 1.0000 | INHALATION_SPRAY | Freq: Every day | RESPIRATORY_TRACT | 11 refills | Status: AC

## 2017-12-15 NOTE — Telephone Encounter (Signed)
Pt is aware of below message and voiced her understanding. Rx for Anoro has been sent to preferred pharmacy. Nothing further is needed.

## 2017-12-21 ENCOUNTER — Telehealth: Payer: Self-pay | Admitting: Internal Medicine

## 2017-12-21 MED ORDER — ARMODAFINIL 150 MG PO TABS: ORAL_TABLET | ORAL | 1 refills | Status: DC

## 2017-12-21 NOTE — Telephone Encounter (Signed)
Called and spoke to patient about the Anoro. Patient stated that he has only been on the Anoro for 4 days and wants insurance to cover Trelegy because he feels that worked better. Patient was advised that he will need to try to [facility] for at least two weeks for the medication to be considered failed and at that time we can do a prior authorization stating he failed Anoro and will need Trelegy.  Patient then reported that he needs a refill for Armodafinil 150mg  tablets. (Last Rx written on 05/04/2017 for #30 with 5 refills)  He stated he would like a 90 day supply.   Dr. Annamaria Boots please advise.   Allergies  Allergen Reactions  . Amoxicillin   . Avelox [Moxifloxacin]   . Levofloxacin     REACTION: disorientation   . Ofloxacin     REACTION: disorientation    . Topamax [Topiramate]     drowsy    Current Outpatient Medications on File Prior to Visit  Medication Sig Dispense Refill  . albuterol (PROVENTIL HFA;VENTOLIN HFA) 108 (90 BASE) MCG/ACT inhaler Inhale 1-2 puffs into the lungs every 6 (six) hours as needed for wheezing or shortness of breath.    . anastrozole (ARIMIDEX) 1 MG tablet     . Armodafinil 150 MG tablet TAKE 1 TABLET BY MOUTH ONCE DAILY (Patient taking differently: TAKE 1 TABLET BY MOUTH ONCE DAILY PRN) 30 tablet 5  . aspirin 81 MG tablet Take 81 mg by mouth daily.      . cetirizine (ZYRTEC) 10 MG tablet Take 10 mg by mouth daily.      . Choline Fenofibrate (FENOFIBRIC ACID) 135 MG CPDR Take 1 capsule by mouth daily.     . clonazePAM (KLONOPIN) 0.5 MG tablet TAKE 1-2 TABLETS BY MOUTH EVERY NIGHT. 60 tablet 5  . doxycycline (PERIOSTAT) 20 MG tablet Take 10 mg by mouth 2 (two) times daily as needed.     . fluticasone (FLONASE) 50 MCG/ACT nasal spray Place 2 sprays into the nose daily as needed.    [name] glucosamine-chondroitin 500-400 MG tablet Take 1 tablet by mouth 2 (two) times daily.      . mometasone (ASMANEX) 220 MCG/INH inhaler Inhale 2 puffs into the lungs 2 (two) times daily.     . montelukast (SINGULAIR) 10 MG tablet Take 10 mg by mouth daily.      . Multiple Vitamins-Minerals (MULTIVITAL PO) Take 1 tablet by mouth daily.      [name] PARoxetine (PAXIL) 30 MG tablet Take 60 mg by mouth daily.     [name] PRESCRIPTION MEDICATION Nocturnal oxygen 2 L at night. Per Dr. Keturah Barre    . propranolol ER (INDERAL LA) 80 MG 24 hr capsule Take 1 capsule (80 mg total) by mouth daily. 30 capsule 6   No current facility-administered medications on file prior to visit.

## 2017-12-21 NOTE — Telephone Encounter (Signed)
Elmyra Ricks from [facility] called. She states that patient called them to advise that Anoro is not working and is causing serious side effects including trouble breathing and difficulty swallowing. Elmyra Ricks is advising that since the patient has attempted to try the Anoro that we can start a new prior auth for the Trelegy to be approved. The patient can be reached at [phone]. Elmyra Ricks can be reached at [phone]. -pr

## 2017-12-21 NOTE — Telephone Encounter (Signed)
Ok to let the patient's appeal of Trelegy denial to proceed.  Meanwhile, ok to refill his armodafinil as requested, # 90, ref x 1

## 2017-12-21 NOTE — Telephone Encounter (Signed)
Spoke with Joellen Jersey and she stated to do PA for Trelegy. Called [facility] [phone] and was informed an appeal is needed as the pt is within 60 days of the PA denial for Trelegy. Initiated appeal and was given reference #: [id number], a determination should be faxed within 72 hours. Called and spoke to pt and informed him of the update for the appeal for Trelegy. Pt verbalized understanding and will wait to hear back from [place] about Trelegy decision and refill for Armodafinil.   Will forward to Katie to keep an eye out for fax. Still need to wait for CY to advise on refill.

## 2017-12-21 NOTE — Telephone Encounter (Signed)
Dr. Annamaria Boots please advise if ok to start PA for trelegy. Also please advise on message from Burman Nieves about refill for Armodafinil. Thanks.

## 2017-12-21 NOTE — Telephone Encounter (Signed)
Called pt letting him know we were going to go through with the appeal for the Trelegy and also that CY stated we could refill his armodafinil.  Pt expressed understanding. Pharmacy was verified with pt.  [facility] and spoke with Ria Comment giving her a verbal of pt's med.  Ria Comment verbalized understanding and stated she would refill med for pt. Nothing further needed at this time.  Due to still awaiting the appeal on pt's trelegy to be sent to our office, will leave this encounter open and route it to [facility] for her to follow up on.

## 2017-12-25 NOTE — Telephone Encounter (Signed)
Report came through that Trelegy has been approved from 12-21-17 through 09-25-18.

## 2017-12-25 NOTE — Telephone Encounter (Signed)
Called and spoke with pt letting him know we did receive approval for Trelegy and that it has been approved through 09/25/18.  Pt expressed understanding.  I stated to pt I was going to call pharmacy to let them know this so that they can fill Rx for pt.  [facility] to see if there was an Rx on file for Trelegy and they stated to me Rx was currently on hold.  I stated to them the Rx had been approved, and once I stated that to them they ran the Rx and saw the approval.  Med being filled for pt. Nothing further needed at this time.

## 2018-01-29 DIAGNOSIS — E291 Testicular hypofunction: Secondary | ICD-10-CM | POA: Diagnosis not present

## 2018-02-01 DIAGNOSIS — N5201 Erectile dysfunction due to arterial insufficiency: Secondary | ICD-10-CM | POA: Diagnosis not present

## 2018-02-01 DIAGNOSIS — E291 Testicular hypofunction: Secondary | ICD-10-CM | POA: Diagnosis not present

## 2018-02-27 DIAGNOSIS — Z6829 Body mass index (BMI) 29.0-29.9, adult: Secondary | ICD-10-CM | POA: Diagnosis not present

## 2018-02-27 DIAGNOSIS — G250 Essential tremor: Secondary | ICD-10-CM | POA: Diagnosis not present

## 2018-02-27 DIAGNOSIS — E559 Vitamin D deficiency, unspecified: Secondary | ICD-10-CM | POA: Diagnosis not present

## 2018-02-27 DIAGNOSIS — R109 Unspecified abdominal pain: Secondary | ICD-10-CM | POA: Diagnosis not present

## 2018-02-27 DIAGNOSIS — Z1331 Encounter for screening for depression: Secondary | ICD-10-CM | POA: Diagnosis not present

## 2018-02-27 DIAGNOSIS — Z0000 Encounter for general adult medical examination without abnormal findings: Secondary | ICD-10-CM | POA: Diagnosis not present

## 2018-02-27 DIAGNOSIS — Z136 Encounter for screening for cardiovascular disorders: Secondary | ICD-10-CM | POA: Diagnosis not present

## 2018-02-27 DIAGNOSIS — Z139 Encounter for screening, unspecified: Secondary | ICD-10-CM | POA: Diagnosis not present

## 2018-02-27 DIAGNOSIS — E785 Hyperlipidemia, unspecified: Secondary | ICD-10-CM | POA: Diagnosis not present

## 2018-02-27 DIAGNOSIS — D582 Other hemoglobinopathies: Secondary | ICD-10-CM | POA: Diagnosis not present

## 2018-02-27 DIAGNOSIS — Z9181 History of falling: Secondary | ICD-10-CM | POA: Diagnosis not present

## 2018-03-02 ENCOUNTER — Other Ambulatory Visit: Payer: Self-pay | Admitting: Internal Medicine

## 2018-03-02 NOTE — Telephone Encounter (Signed)
CY Please advise on refill. Thanks.  

## 2018-03-02 NOTE — Telephone Encounter (Signed)
Ok to refill total 6 months 

## 2018-03-05 ENCOUNTER — Telehealth: Payer: Self-pay | Admitting: Internal Medicine

## 2018-03-05 NOTE — Telephone Encounter (Signed)
Please advise on refill for Klonopin 0.5mg   Last Rx 07/24/2017  Current Outpatient Medications on File Prior to Visit  Medication Sig Dispense Refill  . albuterol (PROVENTIL HFA;VENTOLIN HFA) 108 (90 BASE) MCG/ACT inhaler Inhale 1-2 puffs into the lungs every 6 (six) hours as needed for wheezing or shortness of breath.    . anastrozole (ARIMIDEX) 1 MG tablet     . Armodafinil 150 MG tablet TAKE 1 TABLET BY MOUTH ONCE DAILY PRN 90 tablet 1  . aspirin 81 MG tablet Take 81 mg by mouth daily.      . cetirizine (ZYRTEC) 10 MG tablet Take 10 mg by mouth daily.      . Choline Fenofibrate (FENOFIBRIC ACID) 135 MG CPDR Take 1 capsule by mouth daily.     . clonazePAM (KLONOPIN) 0.5 MG tablet TAKE 1-2 TABLETS BY MOUTH EVERY NIGHT. 60 tablet 5  . doxycycline (PERIOSTAT) 20 MG tablet Take 10 mg by mouth 2 (two) times daily as needed.     . fluticasone (FLONASE) 50 MCG/ACT nasal spray Place 2 sprays into the nose daily as needed.    [name] glucosamine-chondroitin 500-400 MG tablet Take 1 tablet by mouth 2 (two) times daily.      . mometasone (ASMANEX) 220 MCG/INH inhaler Inhale 2 puffs into the lungs 2 (two) times daily.    . montelukast (SINGULAIR) 10 MG tablet Take 10 mg by mouth daily.      . Multiple Vitamins-Minerals (MULTIVITAL PO) Take 1 tablet by mouth daily.      [name] PARoxetine (PAXIL) 30 MG tablet Take 60 mg by mouth daily.     [name] PRESCRIPTION MEDICATION Nocturnal oxygen 2 L at night. Per Dr. Keturah Barre    . propranolol ER (INDERAL LA) 80 MG 24 hr capsule Take 1 capsule (80 mg total) by mouth daily. 30 capsule 6   No current facility-administered medications on file prior to visit.    Allergies  Allergen Reactions  . Amoxicillin   . Avelox [Moxifloxacin]   . Levofloxacin     REACTION: disorientation   . Ofloxacin     REACTION: disorientation    . Topamax [Topiramate]     drowsy

## 2018-03-05 NOTE — Telephone Encounter (Signed)
Ok to refill x 6 months. Please make sure we didn't just do this recently.

## 2018-03-05 NOTE — Telephone Encounter (Signed)
Pt is requesting refill on Klonopin 0.5mg . Last refilled on 07-24-17 # 60. 1-2 tabs qhs. Pt last OV 11-29-17 with no pending OV.  CY please advise. Thanks  Current Outpatient Medications on File Prior to Visit  Medication Sig Dispense Refill  . albuterol (PROVENTIL HFA;VENTOLIN HFA) 108 (90 BASE) MCG/ACT inhaler Inhale 1-2 puffs into the lungs every 6 (six) hours as needed for wheezing or shortness of breath.    . anastrozole (ARIMIDEX) 1 MG tablet     . Armodafinil 150 MG tablet TAKE 1 TABLET BY MOUTH ONCE DAILY PRN 90 tablet 1  . aspirin 81 MG tablet Take 81 mg by mouth daily.      . cetirizine (ZYRTEC) 10 MG tablet Take 10 mg by mouth daily.      . Choline Fenofibrate (FENOFIBRIC ACID) 135 MG CPDR Take 1 capsule by mouth daily.     . clonazePAM (KLONOPIN) 0.5 MG tablet TAKE 1-2 TABLETS BY MOUTH EVERY NIGHT. 60 tablet 5  . doxycycline (PERIOSTAT) 20 MG tablet Take 10 mg by mouth 2 (two) times daily as needed.     . fluticasone (FLONASE) 50 MCG/ACT nasal spray Place 2 sprays into the nose daily as needed.    [name] glucosamine-chondroitin 500-400 MG tablet Take 1 tablet by mouth 2 (two) times daily.      . mometasone (ASMANEX) 220 MCG/INH inhaler Inhale 2 puffs into the lungs 2 (two) times daily.    . montelukast (SINGULAIR) 10 MG tablet Take 10 mg by mouth daily.      . Multiple Vitamins-Minerals (MULTIVITAL PO) Take 1 tablet by mouth daily.      [name] PARoxetine (PAXIL) 30 MG tablet Take 60 mg by mouth daily.     [name] PRESCRIPTION MEDICATION Nocturnal oxygen 2 L at night. Per Dr. Keturah Barre    . propranolol ER (INDERAL LA) 80 MG 24 hr capsule Take 1 capsule (80 mg total) by mouth daily. 30 capsule 6   No current facility-administered medications on file prior to visit.     Allergies  Allergen Reactions  . Amoxicillin   . Avelox [Moxifloxacin]   . Levofloxacin     REACTION: disorientation   . Ofloxacin     REACTION: disorientation    . Topamax [Topiramate]     drowsy

## 2018-03-05 NOTE — Telephone Encounter (Signed)
Ok to refill total 6 months 

## 2018-03-06 MED ORDER — CLONAZEPAM 0.5 MG PO TABS: ORAL_TABLET | ORAL | 5 refills | Status: DC

## 2018-03-06 NOTE — Telephone Encounter (Signed)
Ok will call in [place] to [place]. This was sent in for 6 months.  Called pt to advise but he was unavailable. Left message to return call to let him know. Daniel Terrell

## 2018-03-06 NOTE — Telephone Encounter (Signed)
Noted Will sign off 

## 2018-03-06 NOTE — Telephone Encounter (Signed)
Patient returned call, he was advised Klonopin was called in to [facility] per previous note from [place].  Patient is aware and does not require a call back.

## 2018-03-15 ENCOUNTER — Ambulatory Visit: Payer: PPO | Admitting: Nurse Practitioner

## 2018-03-19 ENCOUNTER — Ambulatory Visit: Payer: PPO | Admitting: Nurse Practitioner

## 2018-05-17 DIAGNOSIS — H524 Presbyopia: Secondary | ICD-10-CM | POA: Diagnosis not present

## 2018-06-01 ENCOUNTER — Other Ambulatory Visit: Payer: Self-pay | Admitting: Internal Medicine

## 2018-06-05 ENCOUNTER — Telehealth: Payer: Self-pay | Admitting: Internal Medicine

## 2018-06-05 NOTE — Telephone Encounter (Signed)
Spoke with wife, advised Rx was already sent to [facility]. She confirmed that they sent an email letting them know the Rx's were mailed. Nothing further is needed.

## 2018-06-18 ENCOUNTER — Telehealth: Payer: Self-pay | Admitting: Internal Medicine

## 2018-06-18 NOTE — Telephone Encounter (Signed)
Patient states he is needing a refill on the Armodafinil 150mg  he take this medication as needed so this medication prescription  is expired.  This [facility] [phone]  Last Ov 11/29/17  Dr. Annamaria Boots please advise thank you.   Allergies as of 06/18/2018      Reactions   Amoxicillin    Avelox [moxifloxacin]    Levofloxacin    REACTION: disorientation   Ofloxacin    REACTION: disorientation   Topamax [topiramate]    drowsy      Medication List        Accurate as of 06/18/18  4:52 PM. Always use your most recent med list.          albuterol 108 (90 Base) MCG/ACT inhaler Commonly known as:  PROVENTIL HFA;VENTOLIN HFA Inhale 1-2 puffs into the lungs every 6 (six) hours as needed for wheezing or shortness of breath.   anastrozole 1 MG tablet Commonly known as:  ARIMIDEX   Armodafinil 150 MG tablet TAKE 1 TABLET BY MOUTH ONCE DAILY PRN   aspirin 81 MG tablet Take 81 mg by mouth daily.   cetirizine 10 MG tablet Commonly known as:  ZYRTEC Take 10 mg by mouth daily.   clonazePAM 0.5 MG tablet Commonly known as:  KLONOPIN TAKE 1-2 TABLETS BY MOUTH ONCE DAILY AT BEDTIME.   clonazePAM 0.5 MG tablet Commonly known as:  KLONOPIN TAKE 1-2 TABLETS BY MOUTH EVERY NIGHT.   doxycycline 20 MG tablet Commonly known as:  PERIOSTAT Take 10 mg by mouth 2 (two) times daily as needed.   Fenofibric Acid 135 MG Cpdr Take 1 capsule by mouth daily.   fluticasone 50 MCG/ACT nasal spray Commonly known as:  FLONASE Place 2 sprays into the nose daily as needed.   glucosamine-chondroitin 500-400 MG tablet Take 1 tablet by mouth 2 (two) times daily.   mometasone 220 MCG/INH inhaler Commonly known as:  ASMANEX Inhale 2 puffs into the lungs 2 (two) times daily.   montelukast 10 MG tablet Commonly known as:  SINGULAIR Take 10 mg by mouth daily.   MULTIVITAL PO Take 1 tablet by mouth daily.   PARoxetine 30 MG tablet Commonly known as:   PAXIL Take 60 mg by mouth daily.   PRESCRIPTION MEDICATION Nocturnal oxygen 2 L at night. Per Dr. Keturah Barre   propranolol ER 80 MG 24 hr capsule Commonly known as:  INDERAL LA Take 1 capsule (80 mg total) by mouth daily.   TRELEGY ELLIPTA 100-62.5-25 MCG/INH Aepb Generic drug:  Fluticasone-Umeclidin-Vilant Inhale 1 puff by mouth once daily

## 2018-06-18 NOTE — Telephone Encounter (Signed)
[facility] called [facility] and waited for a pharmacist to come to the phone to call in medication but was on hold. Please call in Rx in the morning. Hold in triage.

## 2018-06-18 NOTE — Telephone Encounter (Signed)
Ok to refill as before 

## 2018-06-19 MED ORDER — ARMODAFINIL 150 MG PO TABS: ORAL_TABLET | ORAL | 2 refills | Status: DC

## 2018-06-19 NOTE — Telephone Encounter (Signed)
I have called this medication into the pharmacy and patient is aware nothing further needed at this time.

## 2018-06-20 ENCOUNTER — Telehealth: Payer: Self-pay | Admitting: Internal Medicine

## 2018-06-20 NOTE — Telephone Encounter (Signed)
Error

## 2018-08-03 DIAGNOSIS — E291 Testicular hypofunction: Secondary | ICD-10-CM | POA: Diagnosis not present

## 2018-08-07 DIAGNOSIS — N5201 Erectile dysfunction due to arterial insufficiency: Secondary | ICD-10-CM | POA: Diagnosis not present

## 2018-08-07 DIAGNOSIS — E291 Testicular hypofunction: Secondary | ICD-10-CM | POA: Diagnosis not present

## 2018-08-09 DIAGNOSIS — Z23 Encounter for immunization: Secondary | ICD-10-CM | POA: Diagnosis not present

## 2018-08-31 DIAGNOSIS — G250 Essential tremor: Secondary | ICD-10-CM | POA: Diagnosis not present

## 2018-08-31 DIAGNOSIS — J449 Chronic obstructive pulmonary disease, unspecified: Secondary | ICD-10-CM | POA: Diagnosis not present

## 2018-08-31 DIAGNOSIS — E559 Vitamin D deficiency, unspecified: Secondary | ICD-10-CM | POA: Diagnosis not present

## 2018-08-31 DIAGNOSIS — Z6829 Body mass index (BMI) 29.0-29.9, adult: Secondary | ICD-10-CM | POA: Diagnosis not present

## 2018-08-31 DIAGNOSIS — F329 Major depressive disorder, single episode, unspecified: Secondary | ICD-10-CM | POA: Diagnosis not present

## 2018-08-31 DIAGNOSIS — D582 Other hemoglobinopathies: Secondary | ICD-10-CM | POA: Diagnosis not present

## 2018-08-31 DIAGNOSIS — E785 Hyperlipidemia, unspecified: Secondary | ICD-10-CM | POA: Diagnosis not present

## 2018-09-04 DIAGNOSIS — M7541 Impingement syndrome of right shoulder: Secondary | ICD-10-CM | POA: Diagnosis not present

## 2018-09-04 DIAGNOSIS — M25511 Pain in right shoulder: Secondary | ICD-10-CM | POA: Diagnosis not present

## 2018-09-06 DIAGNOSIS — M6281 Muscle weakness (generalized): Secondary | ICD-10-CM | POA: Diagnosis not present

## 2018-09-06 DIAGNOSIS — M25511 Pain in right shoulder: Secondary | ICD-10-CM | POA: Diagnosis not present

## 2018-09-17 ENCOUNTER — Telehealth: Payer: Self-pay | Admitting: Internal Medicine

## 2018-09-17 DIAGNOSIS — L720 Epidermal cyst: Secondary | ICD-10-CM | POA: Diagnosis not present

## 2018-09-17 MED ORDER — FLUTICASONE-UMECLIDIN-VILANT 100-62.5-25 MCG/INH IN AEPB: 1.0000 | INHALATION_SPRAY | Freq: Every day | RESPIRATORY_TRACT | 0 refills | Status: DC

## 2018-09-17 NOTE — Telephone Encounter (Signed)
Pt is calling back [phone]

## 2018-09-17 NOTE — Telephone Encounter (Signed)
Called and spoke with Patient.  He is requesting Trelegy sample.  Samples placed at front desk for Patient pick up.  Patient is aware of office closing at 66, on Tuesday, and will be open at regular hours, Thursday and Friday.  Nothing further at this time.

## 2018-09-17 NOTE — Telephone Encounter (Signed)
Called patient unable to reach left message to give us a call back.

## 2018-10-19 ENCOUNTER — Other Ambulatory Visit: Payer: Self-pay | Admitting: Internal Medicine

## 2018-10-22 ENCOUNTER — Telehealth: Payer: Self-pay | Admitting: Internal Medicine

## 2018-10-22 MED ORDER — FLUTICASONE-UMECLIDIN-VILANT 100-62.5-25 MCG/INH IN AEPB: 1.0000 | INHALATION_SPRAY | Freq: Every day | RESPIRATORY_TRACT | 5 refills | Status: DC

## 2018-10-22 MED ORDER — FLUTICASONE-UMECLIDIN-VILANT 100-62.5-25 MCG/INH IN AEPB: 1.0000 | INHALATION_SPRAY | Freq: Every day | RESPIRATORY_TRACT | 1 refills | Status: DC

## 2018-10-22 NOTE — Telephone Encounter (Signed)
Spoke with pt and advised rx for Trelegy sent to [place]. Nothing further is needed.

## 2018-10-30 ENCOUNTER — Telehealth: Payer: Self-pay | Admitting: Internal Medicine

## 2018-10-30 MED ORDER — CLONAZEPAM 0.5 MG PO TABS: ORAL_TABLET | ORAL | 5 refills | Status: DC

## 2018-10-30 NOTE — Telephone Encounter (Signed)
Clonazepam refill e-sent 

## 2018-10-30 NOTE — Telephone Encounter (Signed)
CY please advise, patient is requesting refill of Clonazepam. Last refill was 02/2018 with quantity of 60 and 5 refills. Patient was last seen 11/2017. Please advise if ok to refill.    Current Outpatient Medications on File Prior to Visit  Medication Sig Dispense Refill  . albuterol (PROVENTIL HFA;VENTOLIN HFA) 108 (90 BASE) MCG/ACT inhaler Inhale 1-2 puffs into the lungs every 6 (six) hours as needed for wheezing or shortness of breath.    . anastrozole (ARIMIDEX) 1 MG tablet     . Armodafinil 150 MG tablet TAKE 1 TABLET BY MOUTH ONCE DAILY PRN 90 tablet 2  . aspirin 81 MG tablet Take 81 mg by mouth daily.      . cetirizine (ZYRTEC) 10 MG tablet Take 10 mg by mouth daily.      . Choline Fenofibrate (FENOFIBRIC ACID) 135 MG CPDR Take 1 capsule by mouth daily.     . clonazePAM (KLONOPIN) 0.5 MG tablet TAKE 1-2 TABLETS BY MOUTH ONCE DAILY AT BEDTIME. 60 tablet 5  . clonazePAM (KLONOPIN) 0.5 MG tablet TAKE 1-2 TABLETS BY MOUTH EVERY NIGHT. 60 tablet 5  . doxycycline (PERIOSTAT) 20 MG tablet Take 10 mg by mouth 2 (two) times daily as needed.     . fluticasone (FLONASE) 50 MCG/ACT nasal spray Place 2 sprays into the nose daily as needed.    . Fluticasone-Umeclidin-Vilant (TRELEGY ELLIPTA) 100-62.5-25 MCG/INH AEPB Inhale 1 puff into the lungs daily. 3 each 1  . glucosamine-chondroitin 500-400 MG tablet Take 1 tablet by mouth 2 (two) times daily.      . montelukast (SINGULAIR) 10 MG tablet Take 10 mg by mouth daily.      . Multiple Vitamins-Minerals (MULTIVITAL PO) Take 1 tablet by mouth daily.      [name] PARoxetine (PAXIL) 30 MG tablet Take 60 mg by mouth daily.     [name] PRESCRIPTION MEDICATION Nocturnal oxygen 2 L at night. Per Dr. Keturah Barre    . propranolol ER (INDERAL LA) 80 MG 24 hr capsule Take 1 capsule (80 mg total) by mouth daily. 30 capsule 6   No current facility-administered medications on file prior to visit.    Allergies  Allergen Reactions  . Amoxicillin   . Avelox [Moxifloxacin]   .  Levofloxacin     REACTION: disorientation   . Ofloxacin     REACTION: disorientation    . Topamax [Topiramate]     drowsy

## 2018-10-30 NOTE — Telephone Encounter (Signed)
Spoke with patient, he is aware and verbalized understanding. Nothing further needed.

## 2019-02-04 DIAGNOSIS — E291 Testicular hypofunction: Secondary | ICD-10-CM | POA: Diagnosis not present

## 2019-02-08 DIAGNOSIS — N5201 Erectile dysfunction due to arterial insufficiency: Secondary | ICD-10-CM | POA: Diagnosis not present

## 2019-02-08 DIAGNOSIS — E291 Testicular hypofunction: Secondary | ICD-10-CM | POA: Diagnosis not present

## 2019-03-18 DIAGNOSIS — Z0000 Encounter for general adult medical examination without abnormal findings: Secondary | ICD-10-CM | POA: Diagnosis not present

## 2019-03-18 DIAGNOSIS — Z1331 Encounter for screening for depression: Secondary | ICD-10-CM | POA: Diagnosis not present

## 2019-03-18 DIAGNOSIS — Z9181 History of falling: Secondary | ICD-10-CM | POA: Diagnosis not present

## 2019-03-18 DIAGNOSIS — I77811 Abdominal aortic ectasia: Secondary | ICD-10-CM | POA: Diagnosis not present

## 2019-03-18 DIAGNOSIS — Z139 Encounter for screening, unspecified: Secondary | ICD-10-CM | POA: Diagnosis not present

## 2019-03-18 DIAGNOSIS — D582 Other hemoglobinopathies: Secondary | ICD-10-CM | POA: Diagnosis not present

## 2019-03-18 DIAGNOSIS — E785 Hyperlipidemia, unspecified: Secondary | ICD-10-CM | POA: Diagnosis not present

## 2019-03-18 DIAGNOSIS — Z1339 Encounter for screening examination for other mental health and behavioral disorders: Secondary | ICD-10-CM | POA: Diagnosis not present

## 2019-03-18 DIAGNOSIS — E559 Vitamin D deficiency, unspecified: Secondary | ICD-10-CM | POA: Diagnosis not present

## 2019-03-18 DIAGNOSIS — Z6830 Body mass index (BMI) 30.0-30.9, adult: Secondary | ICD-10-CM | POA: Diagnosis not present

## 2019-03-18 DIAGNOSIS — J449 Chronic obstructive pulmonary disease, unspecified: Secondary | ICD-10-CM | POA: Diagnosis not present

## 2019-03-18 DIAGNOSIS — Z1211 Encounter for screening for malignant neoplasm of colon: Secondary | ICD-10-CM | POA: Diagnosis not present

## 2019-04-10 ENCOUNTER — Telehealth: Payer: Self-pay | Admitting: Internal Medicine

## 2019-04-10 MED ORDER — ARMODAFINIL 250 MG PO TABS: ORAL_TABLET | ORAL | 1 refills | Status: DC

## 2019-04-10 NOTE — Telephone Encounter (Signed)
Pt called requesting to have his armodafinil refilled as a 90 day supply. Pt last seen 11/29/17, med last filled 06/19/18 #90 with 2 RF.  Pt is also requesting a higher dose so he can split the pill in half to be able to save money. Current dose of pt's med is 150mg .  Dr. Annamaria Boots, please advise if you are okay refilling med for pt. Pharmacy this needs to be sent to is [facility].   Allergies  Allergen Reactions  . Amoxicillin   . Avelox [Moxifloxacin]   . Levofloxacin     REACTION: disorientation   . Ofloxacin     REACTION: disorientation    . Topamax [Topiramate]     drowsy     Current Outpatient Medications:  .  albuterol (PROVENTIL HFA;VENTOLIN HFA) 108 (90 BASE) MCG/ACT inhaler, Inhale 1-2 puffs into the lungs every 6 (six) hours as needed for wheezing or shortness of breath., Disp: , Rfl:  .  anastrozole (ARIMIDEX) 1 MG tablet, , Disp: , Rfl:  .  Armodafinil 150 MG tablet, TAKE 1 TABLET BY MOUTH ONCE DAILY PRN, Disp: 90 tablet, Rfl: 2 .  aspirin 81 MG tablet, Take 81 mg by mouth daily.  , Disp: , Rfl:  .  cetirizine (ZYRTEC) 10 MG tablet, Take 10 mg by mouth daily.  , Disp: , Rfl:  .  Choline Fenofibrate (FENOFIBRIC ACID) 135 MG CPDR, Take 1 capsule by mouth daily. , Disp: , Rfl:  .  clonazePAM (KLONOPIN) 0.5 MG tablet, TAKE 1-2 TABLETS BY MOUTH EVERY NIGHT., Disp: 60 tablet, Rfl: 5 .  clonazePAM (KLONOPIN) 0.5 MG tablet, TAKE 1-2 TABLETS BY MOUTH ONCE DAILY AT BEDTIME., Disp: 60 tablet, Rfl: 5 .  doxycycline (PERIOSTAT) 20 MG tablet, Take 10 mg by mouth 2 (two) times daily as needed. , Disp: , Rfl:  .  fluticasone (FLONASE) 50 MCG/ACT nasal spray, Place 2 sprays into the nose daily as needed., Disp: , Rfl:  .  Fluticasone-Umeclidin-Vilant (TRELEGY ELLIPTA) 100-62.5-25 MCG/INH AEPB, Inhale 1 puff into the lungs daily., Disp: 3 each, Rfl: 1 .  glucosamine-chondroitin 500-400 MG tablet, Take 1 tablet by mouth 2 (two) times daily.  , Disp: , Rfl:  .  montelukast (SINGULAIR) 10  MG tablet, Take 10 mg by mouth daily.  , Disp: , Rfl:  .  Multiple Vitamins-Minerals (MULTIVITAL PO), Take 1 tablet by mouth daily.  , Disp: , Rfl:  .  PARoxetine (PAXIL) 30 MG tablet, Take 60 mg by mouth daily. , Disp: , Rfl:  .  PRESCRIPTION MEDICATION, Nocturnal oxygen 2 L at night. Per Dr. Keturah Barre, Disp: , Rfl:  .  propranolol ER (INDERAL LA) 80 MG 24 hr capsule, Take 1 capsule (80 mg total) by mouth daily., Disp: 30 capsule, Rfl: 6

## 2019-04-10 NOTE — Telephone Encounter (Signed)
Armodafinil refill e-sent

## 2019-04-10 NOTE — Telephone Encounter (Signed)
Called & spoke to pt to let him know his Armodafinil refill has been sent to [facility]. Pt verbalized understanding with no additional questions. Nothing further needed at this time.

## 2019-04-29 ENCOUNTER — Other Ambulatory Visit: Payer: Self-pay | Admitting: Internal Medicine

## 2019-04-29 ENCOUNTER — Telehealth: Payer: Self-pay | Admitting: Internal Medicine

## 2019-04-29 MED ORDER — TRELEGY ELLIPTA 100-62.5-25 MCG/INH IN AEPB: 1.0000 | INHALATION_SPRAY | Freq: Every day | RESPIRATORY_TRACT | 1 refills | Status: DC

## 2019-04-29 NOTE — Telephone Encounter (Signed)
Ok to refill Trelegy total 6 months ( 3 x 2) and help him make a f/u visit within that time.

## 2019-04-29 NOTE — Telephone Encounter (Signed)
Pt calling requesting to have his Trelegy refilled and the Rx sent to [facility].  Pt last seen 11/29/17. Pt has no f/u appt scheduled. Dr. Annamaria Boots, please advise if you are okay with [place] refilling pt's trelegy and also please advise if pt needs to be scheduled a f/u visit with you. Thanks!  Allergies  Allergen Reactions  . Amoxicillin   . Avelox [Moxifloxacin]   . Levofloxacin     REACTION: disorientation   . Ofloxacin     REACTION: disorientation    . Topamax [Topiramate]     drowsy     Current Outpatient Medications:  .  albuterol (PROVENTIL HFA;VENTOLIN HFA) 108 (90 BASE) MCG/ACT inhaler, Inhale 1-2 puffs into the lungs every 6 (six) hours as needed for wheezing or shortness of breath., Disp: , Rfl:  .  anastrozole (ARIMIDEX) 1 MG tablet, , Disp: , Rfl:  .  Armodafinil 250 MG tablet, 1/2 or 1 tab daily as needed for sleepiness, Disp: 90 tablet, Rfl: 1 .  aspirin 81 MG tablet, Take 81 mg by mouth daily.  , Disp: , Rfl:  .  cetirizine (ZYRTEC) 10 MG tablet, Take 10 mg by mouth daily.  , Disp: , Rfl:  .  Choline Fenofibrate (FENOFIBRIC ACID) 135 MG CPDR, Take 1 capsule by mouth daily. , Disp: , Rfl:  .  clonazePAM (KLONOPIN) 0.5 MG tablet, TAKE 1-2 TABLETS BY MOUTH EVERY NIGHT., Disp: 60 tablet, Rfl: 5 .  clonazePAM (KLONOPIN) 0.5 MG tablet, TAKE 1-2 TABLETS BY MOUTH ONCE DAILY AT BEDTIME., Disp: 60 tablet, Rfl: 5 .  doxycycline (PERIOSTAT) 20 MG tablet, Take 10 mg by mouth 2 (two) times daily as needed. , Disp: , Rfl:  .  fluticasone (FLONASE) 50 MCG/ACT nasal spray, Place 2 sprays into the nose daily as needed., Disp: , Rfl:  .  Fluticasone-Umeclidin-Vilant (TRELEGY ELLIPTA) 100-62.5-25 MCG/INH AEPB, Inhale 1 puff into the lungs daily., Disp: 3 each, Rfl: 1 .  glucosamine-chondroitin 500-400 MG tablet, Take 1 tablet by mouth 2 (two) times daily.  , Disp: , Rfl:  .  montelukast (SINGULAIR) 10 MG tablet, Take 10 mg by mouth daily.  , Disp: , Rfl:  .  Multiple Vitamins-Minerals (MULTIVITAL  PO), Take 1 tablet by mouth daily.  , Disp: , Rfl:  .  PARoxetine (PAXIL) 30 MG tablet, Take 60 mg by mouth daily. , Disp: , Rfl:  .  PRESCRIPTION MEDICATION, Nocturnal oxygen 2 L at night. Per Dr. Keturah Barre, Disp: , Rfl:  .  propranolol ER (INDERAL LA) 80 MG 24 hr capsule, Take 1 capsule (80 mg total) by mouth daily., Disp: 30 capsule, Rfl: 6

## 2019-04-29 NOTE — Telephone Encounter (Signed)
Contacted patient Refilled medication as directed and scheduled f/u appt. Nothing further needed.

## 2019-06-17 DIAGNOSIS — Z6830 Body mass index (BMI) 30.0-30.9, adult: Secondary | ICD-10-CM | POA: Diagnosis not present

## 2019-06-17 DIAGNOSIS — Z23 Encounter for immunization: Secondary | ICD-10-CM | POA: Diagnosis not present

## 2019-06-17 DIAGNOSIS — S4992XA Unspecified injury of left shoulder and upper arm, initial encounter: Secondary | ICD-10-CM | POA: Diagnosis not present

## 2019-06-20 DIAGNOSIS — S52502A Unspecified fracture of the lower end of left radius, initial encounter for closed fracture: Secondary | ICD-10-CM | POA: Diagnosis not present

## 2019-06-24 DIAGNOSIS — Z20828 Contact with and (suspected) exposure to other viral communicable diseases: Secondary | ICD-10-CM | POA: Diagnosis not present

## 2019-07-02 DIAGNOSIS — J984 Other disorders of lung: Secondary | ICD-10-CM | POA: Diagnosis not present

## 2019-07-02 DIAGNOSIS — U071 COVID-19: Secondary | ICD-10-CM | POA: Diagnosis not present

## 2019-07-02 DIAGNOSIS — J209 Acute bronchitis, unspecified: Secondary | ICD-10-CM | POA: Diagnosis not present

## 2019-07-04 ENCOUNTER — Encounter: Payer: Self-pay | Admitting: Gastroenterology

## 2019-07-16 DIAGNOSIS — S52532A Colles' fracture of left radius, initial encounter for closed fracture: Secondary | ICD-10-CM | POA: Diagnosis not present

## 2019-07-22 DIAGNOSIS — R05 Cough: Secondary | ICD-10-CM | POA: Diagnosis not present

## 2019-07-22 DIAGNOSIS — J449 Chronic obstructive pulmonary disease, unspecified: Secondary | ICD-10-CM | POA: Diagnosis not present

## 2019-07-22 DIAGNOSIS — Z6830 Body mass index (BMI) 30.0-30.9, adult: Secondary | ICD-10-CM | POA: Diagnosis not present

## 2019-07-22 DIAGNOSIS — J208 Acute bronchitis due to other specified organisms: Secondary | ICD-10-CM | POA: Diagnosis not present

## 2019-07-22 DIAGNOSIS — R531 Weakness: Secondary | ICD-10-CM | POA: Diagnosis not present

## 2019-07-22 DIAGNOSIS — J984 Other disorders of lung: Secondary | ICD-10-CM | POA: Diagnosis not present

## 2019-07-22 DIAGNOSIS — R509 Fever, unspecified: Secondary | ICD-10-CM | POA: Diagnosis not present

## 2019-07-22 DIAGNOSIS — B9689 Other specified bacterial agents as the cause of diseases classified elsewhere: Secondary | ICD-10-CM | POA: Diagnosis not present

## 2019-07-22 DIAGNOSIS — U071 COVID-19: Secondary | ICD-10-CM | POA: Diagnosis not present

## 2019-07-30 DIAGNOSIS — S52532D Colles' fracture of left radius, subsequent encounter for closed fracture with routine healing: Secondary | ICD-10-CM | POA: Diagnosis not present

## 2019-08-12 DIAGNOSIS — Z7689 Persons encountering health services in other specified circumstances: Secondary | ICD-10-CM | POA: Diagnosis not present

## 2019-08-16 DIAGNOSIS — N5201 Erectile dysfunction due to arterial insufficiency: Secondary | ICD-10-CM | POA: Diagnosis not present

## 2019-08-16 DIAGNOSIS — E291 Testicular hypofunction: Secondary | ICD-10-CM | POA: Diagnosis not present

## 2019-08-27 DIAGNOSIS — S52532D Colles' fracture of left radius, subsequent encounter for closed fracture with routine healing: Secondary | ICD-10-CM | POA: Diagnosis not present

## 2019-09-17 DIAGNOSIS — Z6830 Body mass index (BMI) 30.0-30.9, adult: Secondary | ICD-10-CM | POA: Diagnosis not present

## 2019-09-17 DIAGNOSIS — E559 Vitamin D deficiency, unspecified: Secondary | ICD-10-CM | POA: Diagnosis not present

## 2019-09-17 DIAGNOSIS — Z1211 Encounter for screening for malignant neoplasm of colon: Secondary | ICD-10-CM | POA: Diagnosis not present

## 2019-09-17 DIAGNOSIS — J449 Chronic obstructive pulmonary disease, unspecified: Secondary | ICD-10-CM | POA: Diagnosis not present

## 2019-09-17 DIAGNOSIS — R030 Elevated blood-pressure reading, without diagnosis of hypertension: Secondary | ICD-10-CM | POA: Diagnosis not present

## 2019-09-17 DIAGNOSIS — E785 Hyperlipidemia, unspecified: Secondary | ICD-10-CM | POA: Diagnosis not present

## 2019-09-17 DIAGNOSIS — I77811 Abdominal aortic ectasia: Secondary | ICD-10-CM | POA: Diagnosis not present

## 2019-09-17 DIAGNOSIS — D582 Other hemoglobinopathies: Secondary | ICD-10-CM | POA: Diagnosis not present

## 2019-10-02 ENCOUNTER — Encounter: Payer: Self-pay | Admitting: Internal Medicine

## 2019-10-02 ENCOUNTER — Ambulatory Visit (INDEPENDENT_AMBULATORY_CARE_PROVIDER_SITE_OTHER): Payer: PPO | Admitting: Internal Medicine

## 2019-10-02 ENCOUNTER — Other Ambulatory Visit: Payer: Self-pay

## 2019-10-02 DIAGNOSIS — G4733 Obstructive sleep apnea (adult) (pediatric): Secondary | ICD-10-CM | POA: Diagnosis not present

## 2019-10-02 DIAGNOSIS — R0600 Dyspnea, unspecified: Secondary | ICD-10-CM

## 2019-10-02 DIAGNOSIS — R0609 Other forms of dyspnea: Secondary | ICD-10-CM

## 2019-10-02 HISTORY — DX: Other forms of dyspnea: R06.09

## 2019-10-02 NOTE — Assessment & Plan Note (Signed)
Continues bronchodilators with little wheeze. Notes lingering DOE with slow improvement following Covid infection in Sept, 2020. Plan- in-person f/u in 2 months, anticipating possible 6MWT, PFT

## 2019-10-02 NOTE — Patient Instructions (Signed)
I'm sorry that you had to deal with a Covid infection. If your breathing gets worse, by all means let us know. I have asked my nurse to bring you back in March as we discussed to see where you are in your recovery.  Please let us know if we can help

## 2019-10-02 NOTE — Progress Notes (Signed)
Subjective:    Patient ID: Daniel Terrell, male    DOB: 1946-04-15, 74 y.o.   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 His PCP wanted him seen here. O2 2L sleep/ [facility]             Oral appliance for OSA stopped his snoring. Asmanex, Proventil HFA,     Nuvigil 150 At least 3 episodes of acute bronchitis, overlapping since early December.  Had been treated with Z-Pak, then doxycycline and now on a 10-day course of Zithromax 500 mg. CXR at [facility] 2/28 indicating a "little patch" of pneumonia left midlung zone, done at [place] with repeat scheduled 3/21.  Had a few chills.   No overt reflux or aspiration event but he was put on Prilosec, recognizing possibility that he has been aspirating. Using rescue inhaler about twice a day currently.  Robitussin with codeine for cough. He has been staying hoarse.  Not aware of postnasal drainage, sinus headache or sinusitis. Has had Pneumovax, and 2018 had Prevnar.  10/02/19- Virtual Visit via Telephone Note  I connected with Daniel Terrell on 10/02/19 at 10:30 AM EST by telephone and verified that I am speaking with the correct person using two identifiers.  Location: Patient: H Provider: O   I discussed the limitations, risks, security and privacy concerns of performing an evaluation and management service by telephone and the availability of in person appointments. I also discussed  with the patient that there may be a patient responsible charge related to this service. The patient expressed understanding and agreed to proceed.   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 Trelegy 100, Proventil HFA, singulair, flonase,  Clonazepam 0.5,    Nuvigil 250 Continues to sleep w O2. Had Covid managed at home in Sept. Residual DOE lingering, fading only slowly. Not using rescue inhaler but continues Trelegy and occ nebulizer- need neb solution refill.Daniel Terrell  Has weaned away from [place], not used in months.  Continues oral appliance for OSA.   Observations/Objective:   Assessment and Plan: OSA- managed with oral appliance COPD with recurrent acute bronchitis- better year. Trelegy helps Dyspnea on exertion- residual post Covid 4 months ago- reassess in March, ? CT  Follow Up Instructions: 2 months   I discussed the assessment and treatment plan with the patient. The patient was provided an opportunity to ask questions and all were answered. The patient agreed with the plan and demonstrated an understanding of the instructions.   The patient was advised to call back or seek an in-person evaluation if the symptoms worsen or if the condition fails to improve as anticipated.  I provided 21 minutes of non-face-to-face time during this encounter.   [name], MD    Review of Systems- see HPI + = positive Constitutional:   No-   weight loss, night sweats, fevers, chills, fatigue, lassitude. HEENT:   No-  headaches, difficulty swallowing, tooth/dental problems, sore throat,  sneezing, itching, ear ache,  nasal congestion, post nasal drip,  CV:  No-   chest pain, orthopnea, PND, swelling in lower extremities, anasarca, dizziness, palpitations Resp:  +shortness of breath with exertion or at rest.              Per HPI;  No-  coughing up of blood.              No-   change in color of mucus.  + wheezing.   Skin: No-   rash or lesions. GI:  No-   heartburn, indigestion, abdominal pain, nausea, vomiting,  GU:  MS:  No-   joint pain or swelling.   Neuro- nothing unusual Psych:  No- change in mood or affect. No depression or anxiety.  No memory loss. Objective:    OBJ- Physical Exam General- Alert, Oriented, Affect-appropriate, Distress- none acute Skin- rash-none, lesions- none, excoriation- none Lymphadenopathy- none Head- atraumatic            Eyes- Gross vision intact, PERRLA, conjunctivae and secretions clear            Ears- Hearing, canals-normal            Nose- Clear, no-Septal dev, mucus, polyps, erosion, perforation             Throat- Mallampati II , mucosa clear , drainage- none, tonsils- atrophic + hoarse without stridor Neck- flexible , trachea midline, no stridor , thyroid nl, carotid no bruit Chest - symmetrical excursion , unlabored           Heart/CV- RRR , no murmur , no gallop  , no rub, nl s1 s2                           - JVD- none , edema- none, stasis changes- none, varices- none           Lung- +coarse sounds L back, wheeze- none, cough+ , dullness-none, rub- none           Chest wall-  Abd-  Br/ Gen/ Rectal- Not done, not indicated Extrem- cyanosis- none, clubbing, none, atrophy- none, strength- nl Neuro- grossly intact to observation    Assessment & Plan:

## 2019-10-02 NOTE — Assessment & Plan Note (Signed)
Continues satisfied with oral appliance and supplemental O2 2l

## 2019-12-02 ENCOUNTER — Ambulatory Visit (INDEPENDENT_AMBULATORY_CARE_PROVIDER_SITE_OTHER): Payer: PPO | Admitting: Internal Medicine

## 2019-12-02 ENCOUNTER — Ambulatory Visit (INDEPENDENT_AMBULATORY_CARE_PROVIDER_SITE_OTHER)
Admission: RE | Admit: 2019-12-02 | Discharge: 2019-12-02 | Disposition: A | Discharge status: Home / Self Care | Payer: PPO | Source: Ambulatory Visit | Attending: Internal Medicine | Admitting: Internal Medicine

## 2019-12-02 ENCOUNTER — Other Ambulatory Visit: Payer: Self-pay

## 2019-12-02 ENCOUNTER — Encounter: Payer: Self-pay | Admitting: Internal Medicine

## 2019-12-02 VITALS — BP 118/84 | HR 81 | Temp 97.0°F | Ht 70.0 in | Wt 212.0 lb

## 2019-12-02 DIAGNOSIS — U071 COVID-19: Secondary | ICD-10-CM

## 2019-12-02 DIAGNOSIS — G4733 Obstructive sleep apnea (adult) (pediatric): Secondary | ICD-10-CM

## 2019-12-02 DIAGNOSIS — J1282 Pneumonia due to coronavirus disease 2019: Secondary | ICD-10-CM

## 2019-12-02 DIAGNOSIS — R0609 Other forms of dyspnea: Secondary | ICD-10-CM

## 2019-12-02 DIAGNOSIS — R0600 Dyspnea, unspecified: Secondary | ICD-10-CM | POA: Diagnosis not present

## 2019-12-02 NOTE — Progress Notes (Signed)
Subjective:    Patient ID: Daniel Terrell, male    DOB: [birth year], 74 y.o.   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 His PCP wanted him seen here. O2 2L sleep/ [facility]             Oral appliance for OSA stopped his snoring. Asmanex, Proventil HFA,     Nuvigil 150 At least 3 episodes of acute bronchitis, overlapping since early December.  Had been treated with Z-Pak, then doxycycline and now on a 10-day course of Zithromax 500 mg. CXR at [facility] 2/28 indicating a "little patch" of pneumonia left midlung zone, done at [place] with repeat scheduled 3/21.  Had a few chills.   No overt reflux or aspiration event but he was put on Prilosec, recognizing possibility that he has been aspirating. Using rescue inhaler about twice a day currently.  Robitussin with codeine for cough. He has been staying hoarse.  Not aware of postnasal drainage, sinus headache or sinusitis. Has had Pneumovax, and 2018 had Prevnar.  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 Trelegy 100, Proventil HFA, singulair, flonase,  Clonazepam 0.5,    Nuvigil 250 Continues to sleep w O2. Had Covid managed  at home in Sept. Residual DOE lingering, fading only slowly. Not using rescue inhaler but continues Trelegy and occ nebulizer- need neb solution refill.[name]  Has weaned away from [place], not used in months.  Continues oral appliance for OSA.  Observations/Objective: Assessment and Plan: OSA- managed with oral appliance COPD with recurrent acute bronchitis- better year. Trelegy helps Dyspnea on exertion- residual post Covid 4 months ago- reassess in March, ? 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 -----f/u OSA/ DOE. O2 2l/min Body weight today 212 lbs Covid infection Sept, 2020  His wife dropped him off at our old office and left. Instead of calling us, he walked here (about 2.5 miles) !! But thinks he notices easier DOE with ADLs since Covid infection.  Daily Trelegy. Neb Duoneb used occasionally - does help when needed. Little cough.  Review of Systems- see HPI + = positive Constitutional:   No-   weight loss, night sweats, fevers, chills, fatigue, lassitude. HEENT:   No-  headaches, difficulty swallowing, tooth/dental problems, sore throat,        sneezing, itching, ear ache,  nasal congestion, post nasal drip,  CV:  No-   chest pain, orthopnea, PND, swelling in lower extremities, anasarca, dizziness, palpitations Resp: +shortness of breath with exertion or at rest.              Per HPI;  No-  coughing up of blood.              No-   change in color of mucus.  + wheezing.   Skin: No-   rash or lesions. GI:  No-   heartburn, indigestion, abdominal pain, nausea, vomiting,  GU:  MS:  No-   joint pain or swelling.   Neuro- nothing unusual Psych:  No- change in mood or  affect. No depression or anxiety.  No memory loss. Objective:    OBJ- Physical Exam General- Alert, Oriented, Affect-appropriate, Distress- none acute Skin- rash-none, lesions- none, excoriation- none Lymphadenopathy- none Head- atraumatic            Eyes- Gross vision intact, PERRLA, conjunctivae and secretions clear            Ears- Hearing, canals-normal            Nose- Clear, no-Septal dev, mucus, polyps, erosion, perforation             Throat- Mallampati II , mucosa clear , drainage- none, tonsils- atrophic  Neck- flexible , trachea midline, no stridor , thyroid nl, carotid no bruit Chest - symmetrical excursion , unlabored           Heart/CV- RRR , no murmur , no gallop  , no rub, nl s1 s2                           - JVD- none , edema- none, stasis changes- none, varices- none           Lung- +clear, wheeze- none, cough+ , dullness-none, rub- none           Chest wall-  Abd-  Br/ Gen/ Rectal- Not done, not indicated Extrem- cyanosis- none, clubbing, none, atrophy- none, strength- nl Neuro- grossly intact to observation    Assessment & Plan:

## 2019-12-02 NOTE — Patient Instructions (Signed)
Order- CXR  Dx Post Covid pneumonia  ( Sept 2020)   We can continue current meds  Please call if we can help

## 2019-12-15 NOTE — Assessment & Plan Note (Signed)
He has felt comfortable managing with oral appliance,

## 2019-12-15 NOTE — Assessment & Plan Note (Signed)
Little cough or wheeze, but subjectively he thinks easier DOE since covid infection 4 months ago. Note that he walked 2.5 miles to get to this appointment. Plan- CXR, discussed post-covid symptoms

## 2020-01-13 ENCOUNTER — Other Ambulatory Visit: Payer: Self-pay | Admitting: Internal Medicine

## 2020-01-13 DIAGNOSIS — G4733 Obstructive sleep apnea (adult) (pediatric): Secondary | ICD-10-CM | POA: Diagnosis not present

## 2020-03-17 DIAGNOSIS — E559 Vitamin D deficiency, unspecified: Secondary | ICD-10-CM | POA: Diagnosis not present

## 2020-03-17 DIAGNOSIS — Z6830 Body mass index (BMI) 30.0-30.9, adult: Secondary | ICD-10-CM | POA: Diagnosis not present

## 2020-03-17 DIAGNOSIS — I77811 Abdominal aortic ectasia: Secondary | ICD-10-CM | POA: Diagnosis not present

## 2020-03-17 DIAGNOSIS — D582 Other hemoglobinopathies: Secondary | ICD-10-CM | POA: Diagnosis not present

## 2020-03-17 DIAGNOSIS — Z79899 Other long term (current) drug therapy: Secondary | ICD-10-CM | POA: Diagnosis not present

## 2020-03-17 DIAGNOSIS — E785 Hyperlipidemia, unspecified: Secondary | ICD-10-CM | POA: Diagnosis not present

## 2020-03-17 DIAGNOSIS — J449 Chronic obstructive pulmonary disease, unspecified: Secondary | ICD-10-CM | POA: Diagnosis not present

## 2020-03-17 DIAGNOSIS — R030 Elevated blood-pressure reading, without diagnosis of hypertension: Secondary | ICD-10-CM | POA: Diagnosis not present

## 2020-04-21 ENCOUNTER — Other Ambulatory Visit: Payer: Self-pay

## 2020-07-13 ENCOUNTER — Telehealth: Payer: Self-pay | Admitting: Internal Medicine

## 2020-07-13 MED ORDER — ARMODAFINIL 250 MG PO TABS: ORAL_TABLET | ORAL | 1 refills | Status: DC

## 2020-07-13 NOTE — Telephone Encounter (Signed)
Armodafinil  Refill sent to mail order

## 2020-07-13 NOTE — Telephone Encounter (Signed)
Pt notified that the rx was sent  Nothing further needed

## 2020-07-13 NOTE — Telephone Encounter (Signed)
Patient requested Trelegy samples and a refill for Armodafinil.  We do not have any Trelegy 100 samples at this time.  Advised Patient to call off Wednesday to see if we have received any Trelegy 100 samples. Understanding stated. Armodafinil refill requested to be sent to [facility] order pharmacy.  Last refill-04/09/20-Armodafinil 250mg - #90, 1 refill  Allergies  Allergen Reactions  . Amoxicillin   . Avelox [Moxifloxacin]   . Levofloxacin     REACTION: disorientation   . Ofloxacin     REACTION: disorientation    . Topamax [Topiramate]     drowsy   Current Outpatient Medications on File Prior to Visit  Medication Sig Dispense Refill  . albuterol (PROVENTIL HFA;VENTOLIN HFA) 108 (90 BASE) MCG/ACT inhaler Inhale 1-2 puffs into the lungs every 6 (six) hours as needed for wheezing or shortness of breath.    . anastrozole (ARIMIDEX) 1 MG tablet     . Armodafinil 250 MG tablet 1/2 or 1 tab daily as needed for sleepiness 90 tablet 1  . aspirin 81 MG tablet Take 81 mg by mouth daily.      . cetirizine (ZYRTEC) 10 MG tablet Take 10 mg by mouth daily.      . cholecalciferol (VITAMIN D3) 25 MCG (1000 UT) tablet Take 1,000 Units by mouth daily.    . Choline Fenofibrate (FENOFIBRIC ACID) 135 MG CPDR Take 1 capsule by mouth daily.     . fluticasone (FLONASE) 50 MCG/ACT nasal spray Place 2 sprays into the nose daily as needed.    [name] glucosamine-chondroitin 500-400 MG tablet Take 1 tablet by mouth 2 (two) times daily.      [name] ipratropium-albuterol (DUONEB) 0.5-2.5 (3) MG/3ML SOLN Take 3 mLs by nebulization.    . montelukast (SINGULAIR) 10 MG tablet Take 10 mg by mouth daily.      . Multiple Vitamins-Minerals (MULTIVITAL PO) Take 1 tablet by mouth daily.      [name] PARoxetine (PAXIL) 30 MG tablet Take 60 mg by mouth daily.     [name] PRESCRIPTION MEDICATION Nocturnal oxygen 2 L at night. Per Dr. Keturah Barre    . propranolol ER (INDERAL LA) 80 MG 24 hr capsule Take 1 capsule (80 mg total) by mouth daily. 30  capsule 6  . TRELEGY ELLIPTA 100-62.5-25 MCG/INH AEPB Inhale 1 puff by mouth into the lungs daily 180 each 2   No current facility-administered medications on file prior to visit.    Message routed to Dr Annamaria Boots to advise

## 2020-07-16 ENCOUNTER — Telehealth: Payer: Self-pay | Admitting: Internal Medicine

## 2020-07-16 MED ORDER — TRELEGY ELLIPTA 100-62.5-25 MCG/INH IN AEPB: 1.0000 | INHALATION_SPRAY | Freq: Every day | RESPIRATORY_TRACT | 0 refills | Status: DC

## 2020-07-16 NOTE — Telephone Encounter (Signed)
Samples left up front for pt. Pt aware.  Nothing further needed at this time- will close encounter.   

## 2020-08-12 ENCOUNTER — Telehealth: Payer: Self-pay | Admitting: Internal Medicine

## 2020-08-12 NOTE — Telephone Encounter (Signed)
Patient returned call, advised patient that we did not have any Trelegy 100 samples at this time.  He stated he has an inhaler with 14 doses left on it.  He is in the donut hold and his inhaler has been costing him $400/month.  Advised him that he can call back at a later time to see if we have gotten any in and we can check again.

## 2020-08-12 NOTE — Telephone Encounter (Signed)
We do not have samples at this time. LMTCB x1 for pt.

## 2020-08-13 MED ORDER — TRELEGY ELLIPTA 100-62.5-25 MCG/INH IN AEPB: 1.0000 | INHALATION_SPRAY | Freq: Every day | RESPIRATORY_TRACT | 0 refills | Status: DC

## 2020-08-13 NOTE — Telephone Encounter (Signed)
ATC patient to let him know that we got samples in. Left message letting him know samples would be placed upfront for him to be picked up. Advised to call us back with any further questions. Nothing further needed at this time.

## 2020-08-13 NOTE — Telephone Encounter (Signed)
Triage, please advise.  Per the Teams spreadsheet we have samples of trelegy 100.

## 2020-09-16 DIAGNOSIS — Z9181 History of falling: Secondary | ICD-10-CM | POA: Diagnosis not present

## 2020-09-16 DIAGNOSIS — E785 Hyperlipidemia, unspecified: Secondary | ICD-10-CM | POA: Diagnosis not present

## 2020-09-16 DIAGNOSIS — Z0000 Encounter for general adult medical examination without abnormal findings: Secondary | ICD-10-CM | POA: Diagnosis not present

## 2020-09-16 DIAGNOSIS — Z1331 Encounter for screening for depression: Secondary | ICD-10-CM | POA: Diagnosis not present

## 2020-09-16 DIAGNOSIS — Z139 Encounter for screening, unspecified: Secondary | ICD-10-CM | POA: Diagnosis not present

## 2020-09-17 DIAGNOSIS — Z6828 Body mass index (BMI) 28.0-28.9, adult: Secondary | ICD-10-CM | POA: Diagnosis not present

## 2020-09-17 DIAGNOSIS — G250 Essential tremor: Secondary | ICD-10-CM | POA: Diagnosis not present

## 2020-09-17 DIAGNOSIS — E785 Hyperlipidemia, unspecified: Secondary | ICD-10-CM | POA: Diagnosis not present

## 2020-09-17 DIAGNOSIS — R030 Elevated blood-pressure reading, without diagnosis of hypertension: Secondary | ICD-10-CM | POA: Diagnosis not present

## 2020-09-17 DIAGNOSIS — J449 Chronic obstructive pulmonary disease, unspecified: Secondary | ICD-10-CM | POA: Diagnosis not present

## 2020-09-17 DIAGNOSIS — D582 Other hemoglobinopathies: Secondary | ICD-10-CM | POA: Diagnosis not present

## 2020-09-17 DIAGNOSIS — E559 Vitamin D deficiency, unspecified: Secondary | ICD-10-CM | POA: Diagnosis not present

## 2020-09-17 DIAGNOSIS — F330 Major depressive disorder, recurrent, mild: Secondary | ICD-10-CM | POA: Diagnosis not present

## 2020-09-17 DIAGNOSIS — I77811 Abdominal aortic ectasia: Secondary | ICD-10-CM | POA: Diagnosis not present

## 2020-10-02 ENCOUNTER — Ambulatory Visit: Payer: PPO | Admitting: Urology

## 2020-10-15 ENCOUNTER — Ambulatory Visit: Payer: PPO | Admitting: Urology

## 2020-11-09 ENCOUNTER — Ambulatory Visit (INDEPENDENT_AMBULATORY_CARE_PROVIDER_SITE_OTHER): Payer: PPO | Admitting: Urology

## 2020-11-09 ENCOUNTER — Other Ambulatory Visit: Payer: Self-pay

## 2020-11-09 ENCOUNTER — Encounter: Payer: Self-pay | Admitting: Urology

## 2020-11-09 VITALS — BP 156/85 | HR 62 | Temp 98.5°F | Ht 70.0 in | Wt 195.0 lb

## 2020-11-09 DIAGNOSIS — N5201 Erectile dysfunction due to arterial insufficiency: Secondary | ICD-10-CM

## 2020-11-09 DIAGNOSIS — E291 Testicular hypofunction: Secondary | ICD-10-CM | POA: Insufficient documentation

## 2020-11-09 HISTORY — DX: Testicular hypofunction: E29.1

## 2020-11-09 HISTORY — DX: Erectile dysfunction due to arterial insufficiency: N52.01

## 2020-11-09 LAB — URINALYSIS, ROUTINE W REFLEX MICROSCOPIC
Bilirubin, UA: NEGATIVE
Glucose, UA: NEGATIVE
Ketones, UA: NEGATIVE
Leukocytes,UA: NEGATIVE
Nitrite, UA: NEGATIVE
Protein,UA: NEGATIVE
RBC, UA: NEGATIVE
Specific Gravity, UA: 1.02 (ref 1.005–1.030)
Urobilinogen, Ur: 0.2 mg/dL (ref 0.2–1.0)
pH, UA: 6.5 (ref 5.0–7.5)

## 2020-11-09 NOTE — Progress Notes (Signed)
Bladder Scan Patient can void: 6 ml Performed By: Durenda Guthrie, lpn    Urological Symptom Review  Patient is experiencing the following symptoms: Frequent urination Hard to postpone urination Get up at night to urinate Leakage of urine Erection problems (male only)   Review of Systems  Gastrointestinal (upper)  : Negative for upper GI symptoms  Gastrointestinal (lower) : Negative for lower GI symptoms  Constitutional : Night Sweats Fatigue  Skin: Negative for skin symptoms  Eyes: Negative for eye symptoms  Ear/Nose/Throat : Negative for Ear/Nose/Throat symptoms  Hematologic/Lymphatic: Negative for Hematologic/Lymphatic symptoms  Cardiovascular : Negative for cardiovascular symptoms  Respiratory : Cough Shortness of breath  Endocrine: Negative for endocrine symptoms  Musculoskeletal: Back pain Joint pain  Neurological: Negative for neurological symptoms  Psychologic: Negative for psychiatric symptoms

## 2020-11-09 NOTE — Progress Notes (Signed)
11/09/2020 2:33 PM   Daniel Terrell [birth year] [id number]  Referring provider: Nicoletta Dress, MD [address] [place],  [place] [address]  followup hypogonadism and ED  HPI: [name] is a 75yo here for followup for hypogonadism and erectile dysfunction. He continue to have issues getting and maintaining an erection. He previously tried PDE5s, ICI, VED which all failed to give him a good erection. He stopped the anastrazole 8-10 months ago due to hair loss. Energy is good.  No other complaints today.  His records from [facility] are as follows: I have a decreased testosterone level.  HPI: Daniel Terrell is a 75 year-old male established patient who is here for a decreased testosterone level.  He has had the symptoms for 9 years. His symptoms have gotten worse over the last year. He does become fatigued easily. He has not gained weight.   His sex drive has decreased. He does have problems with erections.   He has not had injuries to the testicles or scrotum. He has not had scrotal surgery.   08/22/2017: He was previously on testosterone gels then was switched to IM testosterone 200 every 2 weeks but was having night sweats. He was then switched to 200mg  monthly. His last testosterone 1 week after injection was 888. Estrogen was 232.  His estrogen has ranged from 80-232.   11/02/2017: His estradiol is <5. hemoglobin 15.6 testosterone 245   02/01/2018: estradiol <5. Testosterone 288. MIld Fatigue. He stopped cloimphene due to hair loss   08/07/2018: testosterone 322. hgb 15. CMP normal. estradiol <5. he has mild fatigue   02/08/2019: Testosterone 470. estrogen 13. CMP normal. hemoglobin normal. good energy.   08/16/2019: testosterone 404, estrodiol undetectable. hemoglobin 15.5. good energy.     CC: I am having trouble with my erections.  HPI: He first stated noticing pain on approximately 01/25/2011. His symptoms did begin gradually. His symptoms have been stable  over the last year.   He does have difficulties achieving an erection. He does have problems maintaining his erections. His erections are straight. He has tried Viagra. It did not work. He has tried penile injection therapy. He used Trimix.   He does not have premature ejaculation. He does not have trouble reaching climax. He does not have anxiety because of the symptoms.   08/07/2018: He is using trimix 81ml with mixed results   02/08/2019: Patient is using trimix with good results.   08/16/2019: He has not done a tirmix injection in several months     IIEF-5 Score: The patient's confidence that he can get an erection is high. The patient's erections were hard enough for penetration a few times. The patient was able to maintain his erection after he had penetrated his partner sometimes. During sexual intercouse, it was difficult to maintain his erection to the completion of intercourse. The patient found sexual intercourse satisfactory most times.   Calculated IIEF-5 Symptom Score: 16     PMH: Past Medical History:  Diagnosis Date  . Arthritis   . Asthma   . Colon polyps   . Complication of anesthesia   . COPD (chronic obstructive pulmonary disease) ([place])   . Hypertriglyceridemia   . IBS (irritable bowel syndrome)   . OCD (obsessive compulsive disorder)   . PONV (postoperative nausea and vomiting)   . Prostate cancer ([place])   . Sleep apnea     Surgical History: Past Surgical History:  Procedure Laterality Date  . BLADDER SURGERY  extension  . FINGER SURGERY     left x 3  . GREAT TOE ARTHRODESIS, INTERPHALANGEAL JOINT    . KNEE ARTHROSCOPY     left  . PROSTATECTOMY    . TONSILLECTOMY      Home Medications:  Allergies as of 11/09/2020      Reactions   Amoxicillin    Avelox [moxifloxacin]    Levofloxacin    REACTION: disorientation   Ofloxacin    REACTION: disorientation   Topamax [topiramate]    drowsy      Medication List       Accurate as of November 09, 2020  2:33 PM. If you have any questions, ask your nurse or doctor.        STOP taking these medications   anastrozole 1 MG tablet Commonly known as: ARIMIDEX Stopped by: Nicolette Bang, MD     TAKE these medications   albuterol 108 (90 Base) MCG/ACT inhaler Commonly known as: VENTOLIN HFA Inhale 1-2 puffs into the lungs every 6 (six) hours as needed for wheezing or shortness of breath.   Armodafinil 250 MG tablet 1/2 or 1 tab daily as needed for sleepiness   aspirin 81 MG tablet Take 81 mg by mouth daily.   cetirizine 10 MG tablet Commonly known as: ZYRTEC Take 10 mg by mouth daily.   cholecalciferol 25 MCG (1000 UNIT) tablet Commonly known as: VITAMIN D3 Take 1,000 Units by mouth daily.   Fenofibric Acid 135 MG Cpdr Take 1 capsule by mouth daily.   fluticasone 50 MCG/ACT nasal spray Commonly known as: FLONASE Place 2 sprays into the nose daily as needed.   glucosamine-chondroitin 500-400 MG tablet Take 1 tablet by mouth 2 (two) times daily.   ipratropium-albuterol 0.5-2.5 (3) MG/3ML Soln Commonly known as: DUONEB Take 3 mLs by nebulization.   meloxicam 15 MG tablet Commonly known as: MOBIC Take 15 mg by mouth daily.   montelukast 10 MG tablet Commonly known as: SINGULAIR Take 10 mg by mouth daily.   MULTIVITAL PO Take 1 tablet by mouth daily.   omeprazole 20 MG capsule Commonly known as: PRILOSEC Take 20 mg by mouth daily.   PARoxetine 30 MG tablet Commonly known as: PAXIL Take 60 mg by mouth daily.   PRESCRIPTION MEDICATION Nocturnal oxygen 2 L at night. Per Dr. Keturah Barre   propranolol ER 80 MG 24 hr capsule Commonly known as: INDERAL LA Take 1 capsule (80 mg total) by mouth daily.   Trelegy Ellipta 100-62.5-25 MCG/INH Aepb Generic drug: Fluticasone-Umeclidin-Vilant Inhale 1 puff into the lungs daily.   Trelegy Ellipta 100-62.5-25 MCG/INH Aepb Generic drug: Fluticasone-Umeclidin-Vilant Inhale 1 puff into the lungs daily.        Allergies:  Allergies  Allergen Reactions  . Amoxicillin   . Avelox [Moxifloxacin]   . Levofloxacin     REACTION: disorientation   . Ofloxacin     REACTION: disorientation    . Topamax [Topiramate]     drowsy    Family History: Family History  Problem Relation Age of Onset  . Colon polyps Mother        son  . Tremor Mother   . Breast cancer Maternal Grandmother   . Colon cancer Maternal Grandfather     Social History:  reports that he quit smoking about 50 years ago. His smoking use included cigarettes. He has never used smokeless tobacco. He reports that he does not drink alcohol and does not use drugs.  ROS: All other review of systems  were reviewed and are negative except what is noted above in HPI  Physical Exam: BP (!) 156/85   Pulse 62   Temp 98.5 F (36.9 C)   Ht 5\' 10"  (1.778 m)   Wt 195 lb (88.5 kg)   BMI 27.98 kg/m   Constitutional:  Alert and oriented, No acute distress. HEENT: [place] AT, moist mucus membranes.  Trachea midline, no masses. Cardiovascular: No clubbing, cyanosis, or edema. Respiratory: Normal respiratory effort, no increased work of breathing. GI: Abdomen is soft, nontender, nondistended, no abdominal masses GU: No CVA tenderness.  Lymph: No cervical or inguinal lymphadenopathy. Skin: No rashes, bruises or suspicious lesions. Neurologic: Grossly intact, no focal deficits, moving all 4 extremities. Psychiatric: Normal mood and affect.  Laboratory Data: Lab Results  Component Value Date   WBC 4.3 (L) 02/18/2011   HGB 16.6 02/18/2011   HCT 47.3 02/18/2011   MCV 94.7 02/18/2011   PLT 192.0 02/18/2011    Lab Results  Component Value Date   CREATININE 0.9 02/18/2011    No results found for: PSA  No results found for: TESTOSTERONE  No results found for: HGBA1C  Urinalysis    Component Value Date/Time   APPEARANCEUR Clear 11/09/2020 1409   GLUCOSEU Negative 11/09/2020 1409   BILIRUBINUR Negative 11/09/2020 1409    PROTEINUR Negative 11/09/2020 1409   NITRITE Negative 11/09/2020 1409   LEUKOCYTESUR Negative 11/09/2020 1409    Lab Results  Component Value Date   LABMICR Comment 11/09/2020    Pertinent Imaging:  No results found for this or any previous visit.  No results found for this or any previous visit.  No results found for this or any previous visit.  No results found for this or any previous visit.  No results found for this or any previous visit.  No results found for this or any previous visit.  No results found for this or any previous visit.  No results found for this or any previous visit.   Assessment & Plan:    1. Male hypogonadism -continue observation - Urinalysis, Routine w reflex microscopic - BLADDER SCAN AMB NON-IMAGING  2. Erectile dysfunction due to arterial insufficiency -Observation. RTC 1 year.    No follow-ups on file.  Nicolette Bang, MD  [facility] [place]

## 2020-11-09 NOTE — Patient Instructions (Signed)
Erectile Dysfunction Erectile dysfunction (ED) is the inability to get or keep an erection in order to have sexual intercourse. ED is considered a symptom of an underlying disorder and not considered a disease. Erectile dysfunction may include:  Inability to get an erection.  Lack of enough hardness of the erection to allow penetration.  Loss of the erection before sex is finished. What are the causes? This condition may be caused by:  Certain medicines, such as: ? Pain relievers. ? Antihistamines. ? Antidepressants. ? Blood pressure medicines. ? Water pills (diuretics). ? Ulcer medicines. ? Muscle relaxants. ? Drugs.  Excessive drinking.  Psychological causes, such as: ? Anxiety. ? Depression. ? Sadness. ? Exhaustion. ? Performance fear. ? Stress.  Physical causes, such as: ? Artery problems. This may include diabetes, smoking, liver disease, or atherosclerosis. ? High blood pressure. ? Hormonal problems, such as low testosterone. ? Obesity. ? Nerve problems. This may include back or pelvic injuries, diabetes mellitus, multiple sclerosis, or Parkinson's disease. What are the signs or symptoms? Symptoms of this condition include:  Inability to get an erection.  Lack of enough hardness of the erection to allow penetration.  Loss of the erection before sex is finished.  Normal erections at some times, but with frequent unsatisfactory episodes.  Low sexual satisfaction in either partner due to erection problems.  A curved penis occurring with erection. The curve may cause pain or the penis may be too curved to allow for intercourse.  Never having nighttime erections. How is this diagnosed? This condition is often diagnosed by:  Performing a physical exam to find other diseases or specific problems with the penis.  Asking you detailed questions about the problem.  Performing blood tests to check for diabetes mellitus or to measure hormone levels.  Performing  other tests to check for underlying health conditions.  Performing an ultrasound exam to check for scarring.  Performing a test to check blood flow to the penis.  Doing a sleep study at home to measure nighttime erections. How is this treated? This condition may be treated by:  Medicine taken by mouth to help you achieve an erection (oral medicine).  Hormone replacement therapy to replace low testosterone levels.  Medicine that is injected into the penis. Your health care provider may instruct you how to give yourself these injections at home.  Vacuum pump. This is a pump with a ring on it. The pump and ring are placed on the penis and used to create pressure that helps the penis become erect.  Penile implant surgery. In this procedure, you may receive: ? An inflatable implant. This consists of cylinders, a pump, and a reservoir. The cylinders can be inflated with a fluid that helps to create an erection, and they can be deflated after intercourse. ? A semi-rigid implant. This consists of two silicone rubber rods. The rods provide some rigidity. They are also flexible, so the penis can both curve downward in its normal position and become straight for sexual intercourse.  Blood vessel surgery, to improve blood flow to the penis. During this procedure, a blood vessel from a different part of the body is placed into the penis to allow blood to flow around (bypass) damaged or blocked blood vessels.  Lifestyle changes, such as exercising more, losing weight, and quitting smoking. Follow these instructions at home: Medicines  Take over-the-counter and prescription medicines only as told by your health care provider. Do not increase the dosage without first discussing it with your health care   provider.  If you are using self-injections, perform injections as directed by your health care provider. Make sure to avoid any veins that are on the surface of the penis. After giving an injection,  apply pressure to the injection site for 5 minutes.   General instructions  Exercise regularly, as directed by your health care provider. Work with your health care provider to lose weight, if needed.  Do not use any products that contain nicotine or tobacco, such as cigarettes and e-cigarettes. If you need help quitting, ask your health care provider.  Before using a vacuum pump, read the instructions that come with the pump and discuss any questions with your health care provider.  Keep all follow-up visits as told by your health care provider. This is important. Contact a health care provider if:  You feel nauseous.  You vomit. Get help right away if:  You are taking oral or injectable medicines and you have an erection that lasts longer than 4 hours. If your health care provider is unavailable, go to the nearest emergency room for evaluation. An erection that lasts much longer than 4 hours can result in permanent damage to your penis.  You have severe pain in your groin or abdomen.  You develop redness or severe swelling of your penis.  You have redness spreading up into your groin or lower abdomen.  You are unable to urinate.  You experience chest pain or a rapid heart beat (palpitations) after taking oral medicines. Summary  Erectile dysfunction (ED) is the inability to get or keep an erection during sexual intercourse. This problem can usually be treated successfully.  This condition is diagnosed based on a physical exam, your symptoms, and tests to determine the cause. Treatment varies depending on the cause and may include medicines, hormone therapy, surgery, or a vacuum pump.  You may need follow-up visits to make sure that you are using your medicines or devices correctly.  Get help right away if you are taking or injecting medicines and you have an erection that lasts longer than 4 hours. This information is not intended to replace advice given to you by your health  care provider. Make sure you discuss any questions you have with your health care provider. Document Revised: 05/29/2020 Document Reviewed: 05/29/2020 Elsevier Patient Education  2021 [employer]  

## 2020-11-27 ENCOUNTER — Other Ambulatory Visit: Payer: Self-pay

## 2020-11-27 MED ORDER — TRELEGY ELLIPTA 100-62.5-25 MCG/INH IN AEPB: 1.0000 | INHALATION_SPRAY | Freq: Every day | RESPIRATORY_TRACT | 0 refills | Status: DC

## 2020-12-01 ENCOUNTER — Ambulatory Visit: Payer: PPO | Admitting: Internal Medicine

## 2020-12-08 DIAGNOSIS — R42 Dizziness and giddiness: Secondary | ICD-10-CM | POA: Diagnosis not present

## 2020-12-08 DIAGNOSIS — I10 Essential (primary) hypertension: Secondary | ICD-10-CM | POA: Diagnosis not present

## 2020-12-08 DIAGNOSIS — I77811 Abdominal aortic ectasia: Secondary | ICD-10-CM | POA: Diagnosis not present

## 2020-12-08 DIAGNOSIS — R0600 Dyspnea, unspecified: Secondary | ICD-10-CM | POA: Diagnosis not present

## 2020-12-08 DIAGNOSIS — Z6829 Body mass index (BMI) 29.0-29.9, adult: Secondary | ICD-10-CM | POA: Diagnosis not present

## 2020-12-14 DIAGNOSIS — K635 Polyp of colon: Secondary | ICD-10-CM | POA: Insufficient documentation

## 2020-12-14 DIAGNOSIS — E781 Pure hyperglyceridemia: Secondary | ICD-10-CM | POA: Insufficient documentation

## 2020-12-14 DIAGNOSIS — E785 Hyperlipidemia, unspecified: Secondary | ICD-10-CM | POA: Insufficient documentation

## 2020-12-14 DIAGNOSIS — M1990 Unspecified osteoarthritis, unspecified site: Secondary | ICD-10-CM | POA: Insufficient documentation

## 2020-12-14 DIAGNOSIS — K589 Irritable bowel syndrome without diarrhea: Secondary | ICD-10-CM | POA: Insufficient documentation

## 2020-12-14 DIAGNOSIS — F429 Obsessive-compulsive disorder, unspecified: Secondary | ICD-10-CM | POA: Insufficient documentation

## 2020-12-14 DIAGNOSIS — R112 Nausea with vomiting, unspecified: Secondary | ICD-10-CM | POA: Insufficient documentation

## 2020-12-14 DIAGNOSIS — Z9889 Other specified postprocedural states: Secondary | ICD-10-CM | POA: Insufficient documentation

## 2020-12-14 DIAGNOSIS — C61 Malignant neoplasm of prostate: Secondary | ICD-10-CM | POA: Insufficient documentation

## 2020-12-14 DIAGNOSIS — J449 Chronic obstructive pulmonary disease, unspecified: Secondary | ICD-10-CM | POA: Insufficient documentation

## 2020-12-14 DIAGNOSIS — T8859XA Other complications of anesthesia, initial encounter: Secondary | ICD-10-CM | POA: Insufficient documentation

## 2020-12-14 DIAGNOSIS — G4730 Sleep apnea, unspecified: Secondary | ICD-10-CM | POA: Insufficient documentation

## 2020-12-24 ENCOUNTER — Ambulatory Visit: Payer: PPO | Admitting: Cardiology

## 2020-12-24 NOTE — Progress Notes (Signed)
Cardiology Office Note:    Date:  12/25/2020   ID:  Daniel Terrell, DOB 22-Dec-1945, MRN [id number]  PCP:  Daniel Dress, MD  Cardiologist:  Daniel More, MD   Referring MD: Daniel Dandy, NP  ASSESSMENT:    1. Chest pain of uncertain etiology   2. 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 Pure hypercholesterolemia   5. Chronic bronchitis, unspecified chronic bronchitis type ([place])    PLAN:    In order of problems listed above:  1. Clinically I feel that the episodes he had to simply January he is anginal equivalent exertional diaphoresis shortness of breath and near loss of consciousness.  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  Cardiac CTA ordered.  If abnormal and high risk markers may benefit from angiography and revascularization.  He is on appropriate care including aspirin antihypertensive beta-blocker.  If CAD is present will need a statin. 2. Blood pressure is improved at target continue ARB 3. We will see him back in 6 weeks and will order follow-up duplex abdominal aorta after office 4. Stable COPD  Next appointment 6 weeks   Medication Adjustments/Labs and Tests Ordered: Current medicines are reviewed at length with the patient today.  Concerns regarding medicines are outlined above.  Orders Placed This Encounter  Procedures  . CT CORONARY MORPH W/CTA COR W/SCORE W/CA W/CM &/OR WO/CM  . CT CORONARY FRACTIONAL FLOW RESERVE DATA PREP  . CT CORONARY FRACTIONAL FLOW RESERVE FLUID ANALYSIS  . Basic metabolic panel  . EKG 12-Lead   Meds ordered this encounter  Medications  . metoprolol tartrate (LOPRESSOR) 100 MG tablet    Sig: Take 1 tablet (100 mg total) by mouth once for 1 dose. Take two hours prior to your cardiac CT    Dispense:  1 tablet    Refill:  0     Chief Complaint  Patient presents with  . Hypertension    History of Present Illness:    Daniel Terrell is a 75 y.o. male with COPD who is being seen  today for the evaluation of elevated blood pressure at the request of Moon, Amy A, NP.  He has a new diagnosis of hypertension at his office visit 12/14/2020 and he was was initiated on an ARB.  Chest x-ray performed 07/22/2019 showed mild patchy bibasilar abnormality present has active infection. 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  He is retired [employer] who despite his COPD he is a think is active man. In December he was using his comfort extremities doing work and developed profound weakness nausea diaphoresis nearly lost consciousness the episode took up to 30 minutes to recover and was so weak he could not sit up or stand at first.  Again in January uses upper extremities and had another episode he was short of breath with both but had no chest pain.  His wife a retired Therapist, sports was very worried about these episodes was seen was found to be hypertensive.  He was started on ARB blood pressure today is normal.  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  He has no history of heart disease congenital rheumatic atrial fibrillation and has had no  chest pain.  He is mildly short of breath with usual activities no edema orthopnea palpitation or syncope. Past Medical History:  Diagnosis Date  . Arthritis   . Asthma   . Colon polyps   . Complication of anesthesia   . COPD (chronic obstructive pulmonary disease) ([place])   . Hypertriglyceridemia   . IBS (irritable bowel syndrome)   . OCD (obsessive compulsive disorder)   . PONV (postoperative nausea and vomiting)   . Prostate cancer ([place])   . Sleep apnea     Past Surgical History:  Procedure Laterality Date  . BLADDER SURGERY     extension  .  COLONOSCOPY  08/13/2009  . FINGER SURGERY     left x 3  . GREAT TOE ARTHRODESIS, INTERPHALANGEAL JOINT    . KNEE ARTHROSCOPY     left  . PROSTATECTOMY    . TONSILLECTOMY      Current Medications: Current Meds  Medication Sig  . albuterol (PROVENTIL HFA;VENTOLIN HFA) 108 (90 BASE) MCG/ACT inhaler Inhale 1-2 puffs into the lungs every 6 (six) hours as needed for wheezing or shortness of breath.  . Armodafinil 250 MG tablet 1/2 or 1 tab daily as needed for sleepiness  . aspirin 81 MG tablet Take 81 mg by mouth daily.  . cetirizine (ZYRTEC) 10 MG tablet Take 10 mg by mouth daily.  . cholecalciferol (VITAMIN D3) 25 MCG (1000 UT) tablet Take 1,000 Units by mouth daily.  . Choline Fenofibrate (FENOFIBRIC ACID) 135 MG CPDR Take 1 capsule by mouth daily.   . fluticasone (FLONASE) 50 MCG/ACT nasal spray Place 2 sprays into the nose daily as needed.  . Fluticasone-Umeclidin-Vilant (TRELEGY ELLIPTA) 100-62.5-25 MCG/INH AEPB Inhale 1 puff into the lungs daily.  [name] glucosamine-chondroitin 500-400 MG tablet Take 1 tablet by mouth 2 (two) times daily.  [name] ipratropium-albuterol (DUONEB) 0.5-2.5 (3) MG/3ML SOLN Take 3 mLs by nebulization.  . meloxicam (MOBIC) 15 MG tablet Take 15 mg by mouth daily.  . metoprolol tartrate (LOPRESSOR) 100 MG tablet Take 1 tablet (100 mg total) by mouth once for 1 dose. Take two hours prior to your cardiac CT  . montelukast (SINGULAIR) 10 MG tablet Take 10 mg by mouth daily.  . Multiple Vitamins-Minerals (MULTIVITAL PO) Take 1 tablet by mouth daily.  [name] olmesartan (BENICAR) 20 MG tablet Take 20 mg by mouth daily.  [name] omeprazole (PRILOSEC) 20 MG capsule Take 20 mg by mouth daily.  [name] PARoxetine (PAXIL) 30 MG tablet Take 60 mg by mouth daily.  [name] PRESCRIPTION MEDICATION Nocturnal oxygen 2 L at night. Per Dr. Keturah Barre  . propranolol ER (INDERAL LA) 80 MG 24 hr capsule Take 1 capsule (80 mg total) by mouth daily.     Allergies:   Amoxicillin, Avelox [moxifloxacin],  Levofloxacin, Ofloxacin, and Topamax [topiramate]   Social History   Socioeconomic History  . Marital status: Married    Spouse name: Denice Paradise  . Number of children: 2  . Years of education: BA  . Highest education level: Not on file  Occupational History  . Occupation: Theme park manager  . Occupation: Retired  Tobacco Use  . Smoking status: Former Smoker    Types: Cigarettes    Quit date: 09/26/1970    Years since quitting: 50.2  . Smokeless tobacco: Never Used  Substance and Sexual Activity  . Alcohol use: No  . Drug use: No  . Sexual activity: Not on file  Other Topics Concern  . Not on file  Social History Narrative   Patient  is married with 3 children.   Patient is right handed.   Patient has a Bachelor's degree.   Patient drinks 2-3 cups daily.      Social Determinants of Health   Financial Resource Strain: Not on file  Food Insecurity: Not on file  Transportation Needs: Not on file  Physical Activity: Not on file  Stress: Not on file  Social Connections: Not on file     Family History: The patient's family history includes Alcohol abuse in his father; Breast cancer in his maternal grandmother and mother; Colon cancer in his maternal grandfather; Colon polyps in his mother; Heart attack in his father; Heart disease in his father; Tremor in his mother.  ROS:   ROS Please see the history of present illness.     All other systems reviewed and are negative.  EKGs/Labs/Other Studies Reviewed:    The following studies were reviewed today:   EKG:  EKG is  ordered today.  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 without acute changes    Physical Exam:    VS:  BP 130/80 (BP Location: Right Arm)   Pulse 75   Ht 5\' 10"  (1.778 m)   Wt 199 lb (90.3 kg)   SpO2 97%   BMI 28.55 kg/m     Wt Readings from Last 3 Encounters:  12/25/20 199 lb (90.3 kg)  11/09/20 195 lb (88.5 kg)  12/02/19 212 lb (96.2 kg)      GEN:  Well nourished, well developed in no acute distress HEENT: Normal NECK: No JVD; No carotid bruits LYMPHATICS: No lymphadenopathy CARDIAC: RRR, no murmurs, rubs, gallops RESPIRATORY:  Clear to auscultation without rales, wheezing or rhonchi  ABDOMEN: Soft, non-tender, non-distended MUSCULOSKELETAL:  No edema; No deformity  SKIN: Warm and dry NEUROLOGIC:  Alert and oriented x 3 PSYCHIATRIC:  Normal affect     Signed, Daniel More, MD  12/25/2020 10:52 AM    [place]

## 2020-12-25 ENCOUNTER — Telehealth: Payer: Self-pay | Admitting: Cardiology

## 2020-12-25 ENCOUNTER — Encounter: Payer: Self-pay | Admitting: Cardiology

## 2020-12-25 ENCOUNTER — Other Ambulatory Visit: Payer: Self-pay

## 2020-12-25 ENCOUNTER — Ambulatory Visit: Payer: PPO | Admitting: Cardiology

## 2020-12-25 VITALS — BP 130/80 | HR 75 | Ht 70.0 in | Wt 199.0 lb

## 2020-12-25 DIAGNOSIS — I77819 Aortic ectasia, unspecified site: Secondary | ICD-10-CM

## 2020-12-25 DIAGNOSIS — J42 Unspecified chronic bronchitis: Secondary | ICD-10-CM

## 2020-12-25 DIAGNOSIS — I10 Essential (primary) hypertension: Secondary | ICD-10-CM | POA: Diagnosis not present

## 2020-12-25 DIAGNOSIS — E7800 Pure hypercholesterolemia, unspecified: Secondary | ICD-10-CM | POA: Diagnosis not present

## 2020-12-25 DIAGNOSIS — R079 Chest pain, unspecified: Secondary | ICD-10-CM

## 2020-12-25 HISTORY — DX: Essential (primary) hypertension: I10

## 2020-12-25 HISTORY — DX: Aortic ectasia, unspecified site: I77.819

## 2020-12-25 MED ORDER — METOPROLOL TARTRATE 100 MG PO TABS: 100.0000 mg | ORAL_TABLET | Freq: Once | ORAL | 0 refills | Status: DC

## 2020-12-25 NOTE — Addendum Note (Signed)
Addended by: Resa Miner I on: 12/25/2020 10:56 AM   Modules accepted: Orders

## 2020-12-25 NOTE — Patient Instructions (Signed)
Medication Instructions:  Your physician recommends that you continue on your current medications as directed. Please refer to the Current Medication list given to you today.  *If you need a refill on your cardiac medications before your next appointment, please call your pharmacy*   Lab Work: Your physician recommends that you return for lab work in: Within one hour of your cardiac CT  BMP  If you have labs (blood work) drawn today and your tests are completely normal, you will receive your results only by: [name] MyChart Message (if you have MyChart) OR . A paper copy in the mail If you have any lab test that is abnormal or we need to change your treatment, we will call you to review the results.   Testing/Procedures: Your cardiac CT will be scheduled at the below location:   [facility] [address] [place], [place] [address] [phone]  If scheduled at [facility], please arrive at the [facility] main entrance (entrance A) of [facility] 30 minutes prior to test start time. Proceed to the [facility] Radiology Department (first floor) to check-in and test prep.  Please follow these instructions carefully (unless otherwise directed):  On the Night Before the Test: . Be sure to Drink plenty of water. . Do not consume any caffeinated/decaffeinated beverages or chocolate 12 hours prior to your test. . Do not take any antihistamines 12 hours prior to your test.  On the Day of the Test: . Drink plenty of water until 1 hour prior to the test. . Do not eat any food 4 hours prior to the test. . You may take your regular medications prior to the test.  . Take metoprolol (Lopressor) two hours prior to test.      After the Test: . Drink plenty of water. . After receiving IV contrast, you may experience a mild flushed feeling. This is normal. . On occasion, you may experience a mild rash up to 24 hours after the test. This is not dangerous. If this  occurs, you can take Benadryl 25 mg and increase your fluid intake. . If you experience trouble breathing, this can be serious. If it is severe call 911 IMMEDIATELY. If it is mild, please call our office. . If you take any of these medications: Glipizide/Metformin, Avandament, Glucavance, please do not take 48 hours after completing test unless otherwise instructed.   Once we have confirmed authorization from your insurance company, we will call you to set up a date and time for your test. Based on how quickly your insurance processes prior authorizations requests, please allow up to 4 weeks to be contacted for scheduling your Cardiac CT appointment. Be advised that routine Cardiac CT appointments could be scheduled as many as 8 weeks after your provider has ordered it.  For non-scheduling related questions, please contact the cardiac imaging nurse navigator should you have any questions/concerns: Marchia Bond, Cardiac Imaging Nurse Navigator Gordy Clement, Cardiac Imaging Nurse Navigator [facility] Direct Office Dial: [phone]   For scheduling needs, including cancellations and rescheduling, please call [place], [phone].     Follow-Up: At [facility], you and your health needs are our priority.  As part of our continuing mission to provide you with exceptional heart care, we have created designated Provider Care Teams.  These Care Teams include your primary Cardiologist (physician) and Advanced Practice Providers (APPs -  Physician Assistants and Nurse Practitioners) who all work together to provide you with  the care you need, when you need it.  We recommend signing up for the patient portal called "MyChart".  Sign up information is provided on this After Visit Summary.  MyChart is used to connect with patients for Virtual Visits (Telemedicine).  Patients are able to view lab/test results, encounter notes, upcoming appointments, etc.  Non-urgent  messages can be sent to your provider as well.   To learn more about what you can do with MyChart, go to [email or web address].    Your next appointment:   6 week(s)  The format for your next appointment:   In Person  Provider:   Shirlee More, MD   Other Instructions

## 2020-12-25 NOTE — Telephone Encounter (Signed)
Refill sent in per request.  

## 2020-12-25 NOTE — Telephone Encounter (Signed)
*  STAT* If patient is at the pharmacy, call can be transferred to refill team.   1. Which medications need to be refilled? (please list name of each medication and dose if known)  metoprolol tartrate (LOPRESSOR) 100 MG tablet   2. Which pharmacy/location (including street and city if local pharmacy) is medication to be sent to? [place], [place].  3. Do they need a 30 day or 90 day supply?  [place]

## 2020-12-29 NOTE — Progress Notes (Signed)
Subjective:    Patient ID: Daniel Terrell, male    DOB: 1945/09/28, 75 y.o.   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 -----f/u OSA/ DOE. O2 2l/min Body weight today 212 lbs Covid infection Sept, 2020  His wife dropped him off at our old office and left. Instead of calling us, he walked here (about 2.5 miles) !! But thinks he notices easier DOE with ADLs since Covid infection.  Daily Trelegy. Neb Duoneb used occasionally - does help when needed. Little cough.  12/30/20- 75 year old male former smoker followed for OSA/oral appliance, COPD/recurrent allergic Bronchitis-skin test positive, complicated by polycythemia/phlebotomy, HTN, Aortic Ectasia, Hypercholesterolemia, Angina? -Trelegy 100, Proventil HFA, singulair, flonase,    Nuvigil 250 O2 2L sleep/ Advanced             Oral appliance for OSA stopped his snoring. 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  Oral appliance was loosening his teeth so he stopped using it, but didn't call Dr Ron Parker about getting it adjusted. Admits noting more daytime tiredness. No longer using clonazepam, which caused too much  morning fatigue.I asked him to talk with his cardiologist about whether Inderal may be contributing, but hard to assess until his OSA is treated, Does use armodafinil, but denies need for refill..  Being evaluated for possible angina/ CAD, pending cardiac CT. Breathing stable with little wheeze or cough. Infrequent use of rescue inhaler. Continues to sleep with O2 2L. CXR 12/02/19- FINDINGS: The heart, hila, mediastinum, lungs, and pleura are unremarkable. The previous pneumonia has resolved. Minimal atelectasis in the left base. IMPRESSION: No active cardiopulmonary disease.  Review of Systems- see HPI + = positive Constitutional:   No-   weight loss, night sweats, fevers, chills, +fatigue, lassitude. HEENT:   No-  headaches, difficulty swallowing, tooth/dental problems, sore throat,        sneezing, itching, ear ache,  nasal congestion, post nasal drip,  CV:  No-   chest pain, orthopnea, PND, swelling in lower extremities, anasarca, dizziness, palpitations Resp: +shortness of breath with exertion or at rest.              Per HPI;  No-  coughing up of blood.              No-   change in color of mucus.  + wheezing.   Skin: No-   rash or lesions. GI:  No-   heartburn, indigestion, abdominal pain, nausea, vomiting,  GU:  MS:  No-   joint pain or swelling.   Neuro- nothing unusual Psych:  No- change in mood or affect. No depression or anxiety.  No memory loss. Objective:    OBJ- Physical Exam General- Alert, Oriented, Affect-appropriate, Distress- none acute ,+ looks well Skin- rash-none, lesions- none, excoriation- none Lymphadenopathy- none Head- atraumatic  Eyes- Gross vision intact, PERRLA, conjunctivae and secretions clear            Ears- Hearing, canals-normal            Nose- Clear, no-Septal dev, mucus, polyps, erosion, perforation             Throat- Mallampati II , mucosa clear , drainage- none, tonsils- atrophic  Neck- flexible , trachea midline, no stridor ,  thyroid nl, carotid no bruit Chest - symmetrical excursion , unlabored           Heart/CV- RRR , no murmur , no gallop  , no rub, nl s1 s2                           - JVD- none , edema- none, stasis changes- none, varices- none           Lung- +clear, wheeze- none, cough-none , dullness-none, rub- none           Chest wall-  Abd-  Br/ Gen/ Rectal- Not done, not indicated Extrem- cyanosis- none, clubbing, none, atrophy- none, strength- nl Neuro- grossly intact to observation    Assessment & Plan:

## 2020-12-30 ENCOUNTER — Encounter: Payer: Self-pay | Admitting: Internal Medicine

## 2020-12-30 ENCOUNTER — Other Ambulatory Visit: Payer: Self-pay

## 2020-12-30 ENCOUNTER — Ambulatory Visit: Payer: PPO | Admitting: Internal Medicine

## 2020-12-30 DIAGNOSIS — J42 Unspecified chronic bronchitis: Secondary | ICD-10-CM

## 2020-12-30 DIAGNOSIS — G4733 Obstructive sleep apnea (adult) (pediatric): Secondary | ICD-10-CM

## 2020-12-30 NOTE — Assessment & Plan Note (Signed)
He seemed to benefit from oral appliance, noting increased fatigue when he stopped. Plan- contact Dr Ron Parker to refit OAP. Consider updating sleep study if needed.Ok to use armodafinil occasionally as before. Question if Inderal might contribute to fatigue.

## 2020-12-30 NOTE — Patient Instructions (Signed)
Follow up with Dr Ron Parker so he can refit your oral appliance  You can ask your cardiologist if Inderal may be contributing to your tiredness.  Please call if we can help

## 2020-12-30 NOTE — Assessment & Plan Note (Signed)
No apparent residual from Covid infection in 2020 Inhalers available but little need now.

## 2020-12-31 ENCOUNTER — Other Ambulatory Visit: Payer: Self-pay

## 2020-12-31 DIAGNOSIS — E7800 Pure hypercholesterolemia, unspecified: Secondary | ICD-10-CM

## 2020-12-31 DIAGNOSIS — I77819 Aortic ectasia, unspecified site: Secondary | ICD-10-CM | POA: Diagnosis not present

## 2020-12-31 DIAGNOSIS — J42 Unspecified chronic bronchitis: Secondary | ICD-10-CM | POA: Diagnosis not present

## 2020-12-31 DIAGNOSIS — I10 Essential (primary) hypertension: Secondary | ICD-10-CM | POA: Diagnosis not present

## 2020-12-31 LAB — BASIC METABOLIC PANEL
BUN/Creatinine Ratio: 26 — ABNORMAL HIGH (ref 10–24)
BUN: 28 mg/dL — ABNORMAL HIGH (ref 8–27)
CO2: 22 mmol/L (ref 20–29)
Calcium: 9.2 mg/dL (ref 8.6–10.2)
Chloride: 103 mmol/L (ref 96–106)
Creatinine, Ser: 1.08 mg/dL (ref 0.76–1.27)
Glucose: 84 mg/dL (ref 65–99)
Potassium: 4.7 mmol/L (ref 3.5–5.2)
Sodium: 138 mmol/L (ref 134–144)
eGFR: 73 mL/min/{1.73_m2} (ref >59)

## 2021-01-01 ENCOUNTER — Telehealth: Payer: Self-pay

## 2021-01-01 NOTE — Telephone Encounter (Signed)
Patient notified of results.

## 2021-01-01 NOTE — Telephone Encounter (Signed)
-----   Message from Richardo Priest, MD sent at 01/01/2021 12:28 PM EDT ----- Good results no changes

## 2021-01-04 ENCOUNTER — Telehealth (HOSPITAL_COMMUNITY): Payer: Self-pay | Admitting: Emergency Medicine

## 2021-01-04 NOTE — Telephone Encounter (Signed)
Reaching out to patient to offer assistance regarding upcoming cardiac imaging study; pt verbalizes understanding of appt date/time, parking situation and where to check in, pre-test NPO status and medications ordered, and verified current allergies; name and call back number provided for further questions should they arise Raesha Coonrod RN Navigator Cardiac Imaging  Heart and Vascular [phone] office [phone] cell 

## 2021-01-05 ENCOUNTER — Ambulatory Visit (HOSPITAL_COMMUNITY)
Admission: RE | Admit: 2021-01-05 | Discharge: 2021-01-05 | Disposition: A | Discharge status: Home / Self Care | Payer: PPO | Source: Ambulatory Visit | Attending: Cardiology | Admitting: Cardiology

## 2021-01-05 ENCOUNTER — Other Ambulatory Visit: Payer: Self-pay

## 2021-01-05 DIAGNOSIS — I2510 Atherosclerotic heart disease of native coronary artery without angina pectoris: Secondary | ICD-10-CM | POA: Diagnosis not present

## 2021-01-05 DIAGNOSIS — I517 Cardiomegaly: Secondary | ICD-10-CM | POA: Diagnosis not present

## 2021-01-05 DIAGNOSIS — I700 Atherosclerosis of aorta: Secondary | ICD-10-CM | POA: Diagnosis not present

## 2021-01-05 DIAGNOSIS — R079 Chest pain, unspecified: Secondary | ICD-10-CM | POA: Diagnosis not present

## 2021-01-05 DIAGNOSIS — Z006 Encounter for examination for normal comparison and control in clinical research program: Secondary | ICD-10-CM

## 2021-01-05 MED ORDER — NITROGLYCERIN 0.4 MG SL SUBL: 0.8000 mg | SUBLINGUAL_TABLET | Freq: Once | SUBLINGUAL | Status: AC | PRN

## 2021-01-05 MED ORDER — NITROGLYCERIN 0.4 MG SL SUBL
SUBLINGUAL_TABLET | SUBLINGUAL | Status: AC
  Administered 2021-01-05: 0.8 mg via SUBLINGUAL
  Filled 2021-01-05: qty 2

## 2021-01-05 MED ORDER — METOPROLOL TARTRATE 5 MG/5ML IV SOLN: 5.0000 mg | INTRAVENOUS | Status: DC | PRN

## 2021-01-05 MED ORDER — IOHEXOL 350 MG/ML SOLN
90.0000 mL | Freq: Once | INTRAVENOUS | Status: AC | PRN
  Administered 2021-01-05: 90 mL via INTRAVENOUS

## 2021-01-05 MED ORDER — METOPROLOL TARTRATE 5 MG/5ML IV SOLN
INTRAVENOUS | Status: AC
  Filled 2021-01-05: qty 5

## 2021-01-05 NOTE — Research (Signed)
IDENTIFY Informed Consent                  Subject Name: Daniel Terrell    Subject met inclusion and exclusion criteria.  The informed consent form, study requirements and expectations were reviewed with the subject and questions and concerns were addressed prior to the signing of the consent form.  The subject verbalized understanding of the trial requirements.  The subject agreed to participate in the IDENTIFY trial and signed the informed consent at 13:09PM on 01/05/21.  The informed consent was obtained prior to performance of any protocol-specific procedures for the subject.  A copy of the signed informed consent was given to the subject and a copy was placed in the subject's medical record.   Meade Maw , Naval architect

## 2021-01-05 NOTE — Progress Notes (Signed)
   01/05/21 1435  Vital Signs  Pulse Rate 66  Pulse Rate Source Monitor  BP 103/79  MAP (mmHg) 84  BP Location Right Arm  BP Method Automatic  Patient Position (if appropriate) Sitting  MEWS Score  MEWS Temp 0  MEWS Systolic 0  MEWS Pulse 0  MEWS RR 0  MEWS LOC 0  MEWS Score 0  MEWS Score Color Green    CT cardiac completed. Provided with bottled water after procedure. Ambulatory to lobby after CT scan complete. Discharged to lobby in stable condition.

## 2021-01-06 ENCOUNTER — Telehealth: Payer: Self-pay

## 2021-01-06 DIAGNOSIS — E781 Pure hyperglyceridemia: Secondary | ICD-10-CM

## 2021-01-06 MED ORDER — ROSUVASTATIN CALCIUM 10 MG PO TABS: 10.0000 mg | ORAL_TABLET | Freq: Every day | ORAL | 3 refills | Status: DC

## 2021-01-06 NOTE — Telephone Encounter (Signed)
-----   Message from Richardo Priest, MD sent at 01/06/2021 12:22 PM EDT ----- Overall a good report he has some very mild atherosclerosis in the coronary arteries no blockage no area of previous heart attack should be on a statin I can start him on rosuvastatin 10 mg daily and have a lipid profile my office or his PCP in about 6 weeks

## 2021-01-06 NOTE — Telephone Encounter (Signed)
Spoke with patient regarding results and recommendation.  Patient verbalizes understanding and is agreeable to plan of care. Advised patient to call back with any issues or concerns.  

## 2021-01-14 DIAGNOSIS — I77811 Abdominal aortic ectasia: Secondary | ICD-10-CM | POA: Diagnosis not present

## 2021-01-14 DIAGNOSIS — I10 Essential (primary) hypertension: Secondary | ICD-10-CM | POA: Diagnosis not present

## 2021-01-14 DIAGNOSIS — Z1211 Encounter for screening for malignant neoplasm of colon: Secondary | ICD-10-CM | POA: Diagnosis not present

## 2021-01-18 ENCOUNTER — Other Ambulatory Visit: Payer: Self-pay | Admitting: Internal Medicine

## 2021-01-20 ENCOUNTER — Ambulatory Visit (INDEPENDENT_AMBULATORY_CARE_PROVIDER_SITE_OTHER): Payer: PPO

## 2021-01-20 ENCOUNTER — Other Ambulatory Visit: Payer: Self-pay

## 2021-01-20 DIAGNOSIS — I77811 Abdominal aortic ectasia: Secondary | ICD-10-CM

## 2021-01-20 DIAGNOSIS — I77819 Aortic ectasia, unspecified site: Secondary | ICD-10-CM

## 2021-01-20 NOTE — Progress Notes (Signed)
Abdominal aortic duplex exam performed.  Jimmy Mostafa Yuan RDCS, RVT 

## 2021-01-28 ENCOUNTER — Telehealth: Payer: Self-pay | Admitting: Internal Medicine

## 2021-01-28 NOTE — Telephone Encounter (Signed)
I have called and LM on VM for pt to call back.  

## 2021-01-28 NOTE — Telephone Encounter (Signed)
Pt called and stated that he is in need of sample of trelegy as this would cost him $400 to get this filled.  Pt stated that he is in the donut hole.    Patient Instructions by Deneise Lever, MD at 12/30/2020 10:00 AM  Author: Deneise Lever, MD Author Type: Physician Filed: 12/30/2020 10:23 AM  Note Status: Signed Cosign: Cosign Not Required Encounter Date: 12/30/2020  Editor: Deneise Lever, MD (Physician)             Follow up with Dr Ron Parker so he can refit your oral appliance  You can ask your cardiologist if Inderal may be contributing to your tiredness.  Please call if we can help       trelegy was sent to the mail order pharmacy on 01/18/21 for #3 with 1 refill.   Last ov--12/30/2020 Next ov--12/30/2021  CY please advise. Thanks

## 2021-01-28 NOTE — Telephone Encounter (Signed)
Called and spoke with Patient.  Dr. Janee Morn recommendations given.  Patient stated he would call his insurance company and call back with preferred inhaler list.  Nothing further at this time.

## 2021-01-28 NOTE — Telephone Encounter (Signed)
We don't get enough samples to sustain every body through the donut hole, unless its only going to last a week or two more for him. Can we help him check with insurance to see if Anoro or Judithann Sauger would be cheaper?

## 2021-02-01 ENCOUNTER — Telehealth: Payer: Self-pay

## 2021-02-01 NOTE — Telephone Encounter (Signed)
Spoke with patient regarding results and recommendation.  Patient verbalizes understanding and is agreeable to plan of care. Advised patient to call back with any issues or concerns.  

## 2021-02-01 NOTE — Telephone Encounter (Signed)
-----   Message from Richardo Priest, MD sent at 01/29/2021 12:30 PM EDT ----- Good result No aneurysm

## 2021-02-22 NOTE — Progress Notes (Signed)
Cardiology Office Note:    Date:  02/23/2021   ID:  ABRAR Terrell, DOB 06-15-1946, MRN [id number]  PCP:  Nicoletta Dress, MD  Cardiologist:  Shirlee More, MD    Referring MD: Nicoletta Dress, MD    ASSESSMENT:    1. Agatston coronary artery calcium score less than 100   2. Mild CAD   3. Essential hypertension   4. Pure hypercholesterolemia    PLAN:    In order of problems listed above:  1. Intolerant of rosuvastatin, he is due to have a lipid profile of his LDL is greater than 100 I would place him on Zetia along with his fenofibrate.  If intolerant then PCSK9 inhibitor. 2. Stable having no further anginal symptoms given a prescription for nitroglycerin and continue his beta-blocker uses propranolol as is also taken for treatment. 3. Stable continue current treatment including ARB 4. Mild enlargement ascending aorta we will plan to do a follow-up CT a few years   Next appointment: 1 year   Medication Adjustments/Labs and Tests Ordered: Current medicines are reviewed at length with the patient today.  Concerns regarding medicines are outlined above.  No orders of the defined types were placed in this encounter.  Meds ordered this encounter  Medications  . nitroGLYCERIN (NITROSTAT) 0.4 MG SL tablet    Sig: Place 1 tablet (0.4 mg total) under the tongue every 5 (five) minutes as needed for chest pain.    Dispense:  25 tablet    Refill:  3    Chief Complaint  Patient presents with  . Follow-up  . Coronary Artery Disease    History of Present Illness:    Daniel Terrell is a 75 y.o. male with a hx of COPD, hypertension, hyperlipidemia, aortic ectasia of thoracic aorta and chest pain last seen 12/25/2020.  Compliance with diet, lifestyle and medications: Yes  Reviewed the results of testing with him. He tried a statin and had to stop because of severe muscle pain and weakness within 2 weeks. He has had no further episodes of what was anginal equivalent  severe exertional shortness of breath remains very active man.  He was given a prescription for nitroglycerin. He is pending a follow-up lipid profile with his PCP.  If he is not at target I think he would benefit from his evaluation for injectable PCSK9's. Stable shortness of breath due to COPD No edema orthopnea palpitation or syncope.  He underwent cardiac CTA reported 01/05/2021 showing coronary calcium score of 79-26 percentile for age and sex mildly dilated ascending aorta 41 mm a sending and minimal nonobstructive CAD 1 to 24% right coronary artery and LAD. Screen duplex abdominal aorta 01/20/2021 showed no evidence of aneurysm. Past Medical History:  Diagnosis Date  . Arthritis   . Asthma   . Colon polyps   . Complication of anesthesia   . COPD (chronic obstructive pulmonary disease) ([place])   . Hypertriglyceridemia   . IBS (irritable bowel syndrome)   . OCD (obsessive compulsive disorder)   . PONV (postoperative nausea and vomiting)   . Prostate cancer ([place])   . Sleep apnea     Past Surgical History:  Procedure Laterality Date  . BLADDER SURGERY     extension  . COLONOSCOPY  08/13/2009  . FINGER SURGERY     left x 3  . GREAT TOE ARTHRODESIS, INTERPHALANGEAL JOINT    . KNEE ARTHROSCOPY     left  . PROSTATECTOMY    . TONSILLECTOMY  Current Medications: Current Meds  Medication Sig  . albuterol (PROVENTIL HFA;VENTOLIN HFA) 108 (90 BASE) MCG/ACT inhaler Inhale 1-2 puffs into the lungs every 6 (six) hours as needed for wheezing or shortness of breath.  . Armodafinil 250 MG tablet 1/2 or 1 tab daily as needed for sleepiness  . aspirin 81 MG tablet Take 81 mg by mouth daily.  . cetirizine (ZYRTEC) 10 MG tablet Take 10 mg by mouth daily.  . cholecalciferol (VITAMIN D3) 25 MCG (1000 UT) tablet Take 1,000 Units by mouth daily.  . Choline Fenofibrate (FENOFIBRIC ACID) 135 MG CPDR Take 1 capsule by mouth daily.   . fluticasone (FLONASE) 50 MCG/ACT nasal spray Place 2  sprays into the nose daily as needed.  [name] glucosamine-chondroitin 500-400 MG tablet Take 1 tablet by mouth 2 (two) times daily.  [name] ipratropium-albuterol (DUONEB) 0.5-2.5 (3) MG/3ML SOLN Take 3 mLs by nebulization.  . meloxicam (MOBIC) 15 MG tablet Take 15 mg by mouth daily.  . montelukast (SINGULAIR) 10 MG tablet Take 10 mg by mouth daily.  . Multiple Vitamins-Minerals (MULTIVITAL PO) Take 1 tablet by mouth daily.  . nitroGLYCERIN (NITROSTAT) 0.4 MG SL tablet Place 1 tablet (0.4 mg total) under the tongue every 5 (five) minutes as needed for chest pain.  [name] olmesartan (BENICAR) 40 MG tablet Take 40 mg by mouth daily.  [name] omeprazole (PRILOSEC) 20 MG capsule Take 20 mg by mouth daily.  [name] PARoxetine (PAXIL) 30 MG tablet Take 60 mg by mouth daily.  [name] PRESCRIPTION MEDICATION Nocturnal oxygen 2 L at night. Per Dr. Keturah Barre  . propranolol ER (INDERAL LA) 80 MG 24 hr capsule Take 1 capsule (80 mg total) by mouth daily.  . rosuvastatin (CRESTOR) 10 MG tablet Take 1 tablet (10 mg total) by mouth daily.  . TRELEGY ELLIPTA 100-62.5-25 MCG/INH AEPB Inhale 1 puff by mouth into the lungs daily (Patient needs office visit for futher refills)     Allergies:   Rosuvastatin calcium, Amoxicillin, Avelox [moxifloxacin], Levofloxacin, Ofloxacin, and Topamax [topiramate]   Social History   Socioeconomic History  . Marital status: Married    Spouse name: Denice Paradise  . Number of children: 2  . Years of education: BA  . Highest education level: Not on file  Occupational History  . Occupation: Theme park manager  . Occupation: Retired  Tobacco Use  . Smoking status: Former Smoker    Types: Cigarettes    Quit date: 09/26/1970    Years since quitting: 50.4  . Smokeless tobacco: Never Used  Vaping Use  . Vaping Use: Never used  Substance and Sexual Activity  . Alcohol use: No  . Drug use: No  . Sexual activity: Not on file  Other Topics Concern  . Not on file  Social History Narrative   Patient is married with 3 children.    Patient is right handed.   Patient has a Bachelor's degree.   Patient drinks 2-3 cups daily.      Social Determinants of Health   Financial Resource Strain: Not on file  Food Insecurity: Not on file  Transportation Needs: Not on file  Physical Activity: Not on file  Stress: Not on file  Social Connections: Not on file     Family History: The patient's family history includes Alcohol abuse in his father; Breast cancer in his maternal grandmother and mother; Colon cancer in his maternal grandfather; Colon polyps in his mother; Heart attack in his father; Heart disease in his father; Tremor in his mother. ROS:  Please see the history of present illness.    All other systems reviewed and are negative.  EKGs/Labs/Other Studies Reviewed:    The following studies were reviewed today:    Recent Labs: 12/31/2020: BUN 28; Creatinine, Ser 1.08; Potassium 4.7; Sodium 138  Recent Lipid Panel 09/16/2020: Cholesterol 184 LDL 118 triglycerides 86 HDL 50  Physical Exam:    VS:  BP (!) 142/89 (BP Location: Left Arm)   Pulse 72   Ht 5\' 10"  (1.778 m)   Wt 200 lb 3.2 oz (90.8 kg)   SpO2 97%   BMI 28.73 kg/m     Wt Readings from Last 3 Encounters:  02/23/21 200 lb 3.2 oz (90.8 kg)  12/30/20 197 lb 12.8 oz (89.7 kg)  12/25/20 199 lb (90.3 kg)     GEN: Looks younger than his age well nourished, well developed in no acute distress HEENT: Normal NECK: No JVD; No carotid bruits LYMPHATICS: No lymphadenopathy CARDIAC: RRR, no murmurs, rubs, gallops RESPIRATORY:  Clear to auscultation without rales, wheezing or rhonchi  ABDOMEN: Soft, non-tender, non-distended MUSCULOSKELETAL:  No edema; No deformity  SKIN: Warm and dry NEUROLOGIC:  Alert and oriented x 3 PSYCHIATRIC:  Normal affect    Signed, Shirlee More, MD  02/23/2021 11:03 AM    [place]

## 2021-02-23 ENCOUNTER — Encounter: Payer: Self-pay | Admitting: Cardiology

## 2021-02-23 ENCOUNTER — Other Ambulatory Visit: Payer: Self-pay

## 2021-02-23 ENCOUNTER — Ambulatory Visit: Payer: PPO | Admitting: Cardiology

## 2021-02-23 VITALS — BP 142/89 | HR 72 | Ht 70.0 in | Wt 200.2 lb

## 2021-02-23 DIAGNOSIS — R931 Abnormal findings on diagnostic imaging of heart and coronary circulation: Secondary | ICD-10-CM

## 2021-02-23 DIAGNOSIS — E7800 Pure hypercholesterolemia, unspecified: Secondary | ICD-10-CM | POA: Diagnosis not present

## 2021-02-23 DIAGNOSIS — I10 Essential (primary) hypertension: Secondary | ICD-10-CM

## 2021-02-23 DIAGNOSIS — I2510 Atherosclerotic heart disease of native coronary artery without angina pectoris: Secondary | ICD-10-CM | POA: Diagnosis not present

## 2021-02-23 MED ORDER — NITROGLYCERIN 0.4 MG SL SUBL: 0.4000 mg | SUBLINGUAL_TABLET | SUBLINGUAL | 3 refills | Status: DC | PRN

## 2021-02-23 NOTE — Patient Instructions (Signed)
Medication Instructions:  Your physician has recommended you make the following change in your medication:  START: Nitroglycerin 0.4 mg take one tablet by mouth every 5 minutes up to three times as needed for chest pain.  *If you need a refill on your cardiac medications before your next appointment, please call your pharmacy*   Lab Work: None If you have labs (blood work) drawn today and your tests are completely normal, you will receive your results only by: [name] MyChart Message (if you have MyChart) OR . A paper copy in the mail If you have any lab test that is abnormal or we need to change your treatment, we will call you to review the results.   Testing/Procedures: NOne   Follow-Up: At [facility], you and your health needs are our priority.  As part of our continuing mission to provide you with exceptional heart care, we have created designated Provider Care Teams.  These Care Teams include your primary Cardiologist (physician) and Advanced Practice Providers (APPs -  Physician Assistants and Nurse Practitioners) who all work together to provide you with the care you need, when you need it.  We recommend signing up for the patient portal called "MyChart".  Sign up information is provided on this After Visit Summary.  MyChart is used to connect with patients for Virtual Visits (Telemedicine).  Patients are able to view lab/test results, encounter notes, upcoming appointments, etc.  Non-urgent messages can be sent to your provider as well.   To learn more about what you can do with MyChart, go to [email or web address].    Your next appointment:   1 year(s)  The format for your next appointment:   In Person  Provider:   Shirlee More, MD   Other Instructions

## 2021-03-18 DIAGNOSIS — E785 Hyperlipidemia, unspecified: Secondary | ICD-10-CM | POA: Diagnosis not present

## 2021-03-18 DIAGNOSIS — F330 Major depressive disorder, recurrent, mild: Secondary | ICD-10-CM | POA: Diagnosis not present

## 2021-03-18 DIAGNOSIS — Z6828 Body mass index (BMI) 28.0-28.9, adult: Secondary | ICD-10-CM | POA: Diagnosis not present

## 2021-03-18 DIAGNOSIS — G250 Essential tremor: Secondary | ICD-10-CM | POA: Diagnosis not present

## 2021-03-18 DIAGNOSIS — R252 Cramp and spasm: Secondary | ICD-10-CM | POA: Diagnosis not present

## 2021-03-18 DIAGNOSIS — E559 Vitamin D deficiency, unspecified: Secondary | ICD-10-CM | POA: Diagnosis not present

## 2021-03-18 DIAGNOSIS — J449 Chronic obstructive pulmonary disease, unspecified: Secondary | ICD-10-CM | POA: Diagnosis not present

## 2021-03-18 DIAGNOSIS — D582 Other hemoglobinopathies: Secondary | ICD-10-CM | POA: Diagnosis not present

## 2021-03-18 DIAGNOSIS — I77811 Abdominal aortic ectasia: Secondary | ICD-10-CM | POA: Diagnosis not present

## 2021-03-18 DIAGNOSIS — I10 Essential (primary) hypertension: Secondary | ICD-10-CM | POA: Diagnosis not present

## 2021-03-18 DIAGNOSIS — I739 Peripheral vascular disease, unspecified: Secondary | ICD-10-CM | POA: Diagnosis not present

## 2021-03-24 ENCOUNTER — Other Ambulatory Visit: Payer: Self-pay

## 2021-03-24 DIAGNOSIS — R252 Cramp and spasm: Secondary | ICD-10-CM

## 2021-03-24 DIAGNOSIS — M79605 Pain in left leg: Secondary | ICD-10-CM

## 2021-04-15 ENCOUNTER — Other Ambulatory Visit: Payer: Self-pay

## 2021-04-15 ENCOUNTER — Ambulatory Visit (INDEPENDENT_AMBULATORY_CARE_PROVIDER_SITE_OTHER): Payer: PPO

## 2021-04-15 DIAGNOSIS — M79604 Pain in right leg: Secondary | ICD-10-CM | POA: Diagnosis not present

## 2021-04-15 DIAGNOSIS — R252 Cramp and spasm: Secondary | ICD-10-CM

## 2021-04-15 DIAGNOSIS — M79605 Pain in left leg: Secondary | ICD-10-CM | POA: Diagnosis not present

## 2021-04-15 NOTE — Progress Notes (Signed)
Bilateral lower extremity venous duplex exam performed.  Jimmy Chrsitopher Wik RDCS, RVT

## 2021-04-15 NOTE — Progress Notes (Signed)
Bilateral lower extremity arterial duplex exam performed.  Jimmy Bexleigh Theriault RDCS, RVT 

## 2021-04-30 DIAGNOSIS — I10 Essential (primary) hypertension: Secondary | ICD-10-CM | POA: Diagnosis not present

## 2021-05-05 ENCOUNTER — Other Ambulatory Visit: Payer: Self-pay

## 2021-05-05 DIAGNOSIS — N5201 Erectile dysfunction due to arterial insufficiency: Secondary | ICD-10-CM

## 2021-05-05 MED ORDER — AMBULATORY NON FORMULARY MEDICATION: 0.2000 mL | 3 refills | Status: DC | PRN

## 2021-05-17 ENCOUNTER — Telehealth: Payer: Self-pay | Admitting: Internal Medicine

## 2021-05-17 MED ORDER — ARMODAFINIL 250 MG PO TABS: ORAL_TABLET | ORAL | 1 refills | Status: DC

## 2021-05-17 NOTE — Telephone Encounter (Signed)
Called and spoke with Patient.  Patient aware Dr Annamaria Boots has sent requested prescription to pharmacy.  Nothing further at this time.

## 2021-05-17 NOTE — Telephone Encounter (Signed)
Armodafinil refill sent to his mail order

## 2021-05-17 NOTE — Telephone Encounter (Signed)
Patient requested Armodafinil '250mg'$  refill to be sent to [facility] order pharmacy.   Last refill- 07/13/20 Armodafinil '250mg'$ , take 1/2 - 1 tab as needed for sleepiness, #90, 1 refill  Message routed to Dr. Annamaria Boots to advise  Allergies  Allergen Reactions   Rosuvastatin Calcium Other (See Comments)    Muscle pain   Amoxicillin    Avelox [Moxifloxacin]    Levofloxacin     REACTION: disorientation    Ofloxacin     REACTION: disorientation     Topamax [Topiramate]     drowsy   Current Outpatient Medications on File Prior to Visit  Medication Sig Dispense Refill   albuterol (PROVENTIL HFA;VENTOLIN HFA) 108 (90 BASE) MCG/ACT inhaler Inhale 1-2 puffs into the lungs every 6 (six) hours as needed for wheezing or shortness of breath.     AMBULATORY NON FORMULARY MEDICATION 0.2 mLs by Intracavernosal route as needed. Medication Name: Triple mix 0.2 ml Intracavernosal PRN PGE 30 mch Pap '30mg'$  Phent 1 mg 5 mL 3   Armodafinil 250 MG tablet 1/2 or 1 tab daily as needed for sleepiness 90 tablet 1   aspirin 81 MG tablet Take 81 mg by mouth daily.     cetirizine (ZYRTEC) 10 MG tablet Take 10 mg by mouth daily.     cholecalciferol (VITAMIN D3) 25 MCG (1000 UT) tablet Take 1,000 Units by mouth daily.     Choline Fenofibrate (FENOFIBRIC ACID) 135 MG CPDR Take 1 capsule by mouth daily.      fluticasone (FLONASE) 50 MCG/ACT nasal spray Place 2 sprays into the nose daily as needed.     glucosamine-chondroitin 500-400 MG tablet Take 1 tablet by mouth 2 (two) times daily.     ipratropium-albuterol (DUONEB) 0.5-2.5 (3) MG/3ML SOLN Take 3 mLs by nebulization.     meloxicam (MOBIC) 15 MG tablet Take 15 mg by mouth daily.     montelukast (SINGULAIR) 10 MG tablet Take 10 mg by mouth daily.     Multiple Vitamins-Minerals (MULTIVITAL PO) Take 1 tablet by mouth daily.     nitroGLYCERIN (NITROSTAT) 0.4 MG SL tablet Place 1 tablet (0.4 mg total) under the tongue every 5 (five) minutes as needed for chest pain. 25  tablet 3   olmesartan (BENICAR) 40 MG tablet Take 40 mg by mouth daily.     omeprazole (PRILOSEC) 20 MG capsule Take 20 mg by mouth daily.     PARoxetine (PAXIL) 30 MG tablet Take 60 mg by mouth daily.     PRESCRIPTION MEDICATION Nocturnal oxygen 2 L at night. Per Dr. Keturah Barre     propranolol ER (INDERAL LA) 80 MG 24 hr capsule Take 1 capsule (80 mg total) by mouth daily. 30 capsule 6   rosuvastatin (CRESTOR) 10 MG tablet Take 1 tablet (10 mg total) by mouth daily. 90 tablet 3   TRELEGY ELLIPTA 100-62.5-25 MCG/INH AEPB Inhale 1 puff by mouth into the lungs daily (Patient needs office visit for futher refills) 180 each 1   No current facility-administered medications on file prior to visit.

## 2021-06-01 DIAGNOSIS — M2021 Hallux rigidus, right foot: Secondary | ICD-10-CM | POA: Diagnosis not present

## 2021-06-01 HISTORY — DX: Hallux rigidus, right foot: M20.21

## 2021-07-01 DIAGNOSIS — M545 Low back pain, unspecified: Secondary | ICD-10-CM | POA: Diagnosis not present

## 2021-07-01 DIAGNOSIS — Z6828 Body mass index (BMI) 28.0-28.9, adult: Secondary | ICD-10-CM | POA: Diagnosis not present

## 2021-07-01 DIAGNOSIS — Z23 Encounter for immunization: Secondary | ICD-10-CM | POA: Diagnosis not present

## 2021-07-01 DIAGNOSIS — I10 Essential (primary) hypertension: Secondary | ICD-10-CM | POA: Diagnosis not present

## 2021-09-14 ENCOUNTER — Telehealth: Payer: Self-pay | Admitting: Internal Medicine

## 2021-09-14 MED ORDER — TRELEGY ELLIPTA 100-62.5-25 MCG/ACT IN AEPB: 1.0000 | INHALATION_SPRAY | Freq: Every day | RESPIRATORY_TRACT | 0 refills | Status: DC

## 2021-09-14 NOTE — Telephone Encounter (Signed)
[name] wife came into the office for an appt and asked if we had any samples for Trelegy for patient. 1 sample provided. Nothing further needed at this time.

## 2021-09-21 DIAGNOSIS — Z1331 Encounter for screening for depression: Secondary | ICD-10-CM | POA: Diagnosis not present

## 2021-09-21 DIAGNOSIS — Z139 Encounter for screening, unspecified: Secondary | ICD-10-CM | POA: Diagnosis not present

## 2021-09-21 DIAGNOSIS — Z9181 History of falling: Secondary | ICD-10-CM | POA: Diagnosis not present

## 2021-09-21 DIAGNOSIS — E785 Hyperlipidemia, unspecified: Secondary | ICD-10-CM | POA: Diagnosis not present

## 2021-09-21 DIAGNOSIS — Z0000 Encounter for general adult medical examination without abnormal findings: Secondary | ICD-10-CM | POA: Diagnosis not present

## 2021-10-01 DIAGNOSIS — I77811 Abdominal aortic ectasia: Secondary | ICD-10-CM | POA: Diagnosis not present

## 2021-10-01 DIAGNOSIS — Z6828 Body mass index (BMI) 28.0-28.9, adult: Secondary | ICD-10-CM | POA: Diagnosis not present

## 2021-10-01 DIAGNOSIS — E785 Hyperlipidemia, unspecified: Secondary | ICD-10-CM | POA: Diagnosis not present

## 2021-10-01 DIAGNOSIS — D582 Other hemoglobinopathies: Secondary | ICD-10-CM | POA: Diagnosis not present

## 2021-10-01 DIAGNOSIS — J449 Chronic obstructive pulmonary disease, unspecified: Secondary | ICD-10-CM | POA: Diagnosis not present

## 2021-10-01 DIAGNOSIS — G250 Essential tremor: Secondary | ICD-10-CM | POA: Diagnosis not present

## 2021-10-01 DIAGNOSIS — F330 Major depressive disorder, recurrent, mild: Secondary | ICD-10-CM | POA: Diagnosis not present

## 2021-10-01 DIAGNOSIS — E559 Vitamin D deficiency, unspecified: Secondary | ICD-10-CM | POA: Diagnosis not present

## 2021-10-01 DIAGNOSIS — I10 Essential (primary) hypertension: Secondary | ICD-10-CM | POA: Diagnosis not present

## 2021-10-12 DIAGNOSIS — S82831A Other fracture of upper and lower end of right fibula, initial encounter for closed fracture: Secondary | ICD-10-CM | POA: Diagnosis not present

## 2021-10-12 DIAGNOSIS — M2021 Hallux rigidus, right foot: Secondary | ICD-10-CM | POA: Diagnosis not present

## 2021-10-12 HISTORY — DX: Other fracture of upper and lower end of right fibula, initial encounter for closed fracture: S82.831A

## 2021-10-19 ENCOUNTER — Other Ambulatory Visit: Payer: Self-pay | Admitting: Internal Medicine

## 2021-10-26 DIAGNOSIS — S82831A Other fracture of upper and lower end of right fibula, initial encounter for closed fracture: Secondary | ICD-10-CM | POA: Diagnosis not present

## 2021-11-08 ENCOUNTER — Ambulatory Visit: Payer: PPO | Admitting: Urology

## 2021-11-23 DIAGNOSIS — S82831D Other fracture of upper and lower end of right fibula, subsequent encounter for closed fracture with routine healing: Secondary | ICD-10-CM | POA: Diagnosis not present

## 2021-12-23 DIAGNOSIS — M545 Low back pain, unspecified: Secondary | ICD-10-CM | POA: Diagnosis not present

## 2021-12-23 DIAGNOSIS — J449 Chronic obstructive pulmonary disease, unspecified: Secondary | ICD-10-CM | POA: Diagnosis not present

## 2021-12-23 DIAGNOSIS — F330 Major depressive disorder, recurrent, mild: Secondary | ICD-10-CM | POA: Diagnosis not present

## 2021-12-23 DIAGNOSIS — G250 Essential tremor: Secondary | ICD-10-CM | POA: Diagnosis not present

## 2021-12-23 DIAGNOSIS — Z6830 Body mass index (BMI) 30.0-30.9, adult: Secondary | ICD-10-CM | POA: Diagnosis not present

## 2021-12-23 DIAGNOSIS — R29890 Loss of height: Secondary | ICD-10-CM | POA: Diagnosis not present

## 2021-12-23 DIAGNOSIS — D582 Other hemoglobinopathies: Secondary | ICD-10-CM | POA: Diagnosis not present

## 2021-12-23 DIAGNOSIS — I77811 Abdominal aortic ectasia: Secondary | ICD-10-CM | POA: Diagnosis not present

## 2021-12-23 DIAGNOSIS — I10 Essential (primary) hypertension: Secondary | ICD-10-CM | POA: Diagnosis not present

## 2021-12-23 DIAGNOSIS — E785 Hyperlipidemia, unspecified: Secondary | ICD-10-CM | POA: Diagnosis not present

## 2021-12-23 DIAGNOSIS — E559 Vitamin D deficiency, unspecified: Secondary | ICD-10-CM | POA: Diagnosis not present

## 2021-12-30 ENCOUNTER — Ambulatory Visit: Payer: PPO | Admitting: Internal Medicine

## 2022-01-04 DIAGNOSIS — R2681 Unsteadiness on feet: Secondary | ICD-10-CM | POA: Diagnosis not present

## 2022-01-04 DIAGNOSIS — M545 Low back pain, unspecified: Secondary | ICD-10-CM | POA: Diagnosis not present

## 2022-01-04 DIAGNOSIS — R531 Weakness: Secondary | ICD-10-CM | POA: Diagnosis not present

## 2022-01-11 DIAGNOSIS — M85831 Other specified disorders of bone density and structure, right forearm: Secondary | ICD-10-CM | POA: Diagnosis not present

## 2022-01-11 DIAGNOSIS — S82831D Other fracture of upper and lower end of right fibula, subsequent encounter for closed fracture with routine healing: Secondary | ICD-10-CM | POA: Diagnosis not present

## 2022-01-11 DIAGNOSIS — M8589 Other specified disorders of bone density and structure, multiple sites: Secondary | ICD-10-CM | POA: Diagnosis not present

## 2022-01-11 DIAGNOSIS — M85852 Other specified disorders of bone density and structure, left thigh: Secondary | ICD-10-CM | POA: Diagnosis not present

## 2022-01-12 DIAGNOSIS — R2681 Unsteadiness on feet: Secondary | ICD-10-CM | POA: Diagnosis not present

## 2022-01-12 DIAGNOSIS — R531 Weakness: Secondary | ICD-10-CM | POA: Diagnosis not present

## 2022-01-12 DIAGNOSIS — M545 Low back pain, unspecified: Secondary | ICD-10-CM | POA: Diagnosis not present

## 2022-01-13 ENCOUNTER — Other Ambulatory Visit: Payer: Self-pay

## 2022-01-13 MED ORDER — TRELEGY ELLIPTA 100-62.5-25 MCG/ACT IN AEPB
INHALATION_SPRAY | RESPIRATORY_TRACT | 3 refills | Status: DC
  Filled 2022-08-23 – 2022-10-03 (×2): qty 180, 90d supply, fill #0

## 2022-01-14 NOTE — Progress Notes (Deleted)
Subjective:    Patient ID: Daniel Terrell, male    DOB: Apr 09, 1946, 76 y.o.   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 -Trelegy 100, Proventil HFA, singulair, flonase,    Nuvigil 250 O2 2L sleep/ Advanced             Oral appliance for OSA stopped his snoring. 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  Oral appliance was loosening his teeth so he stopped using it, but didn't call Dr Ron Parker about getting it adjusted. Admits noting more daytime tiredness. No longer using clonazepam, which caused too much morning fatigue.I asked him to talk with his cardiologist about whether Inderal may be contributing, but hard to assess until his OSA is treated, Does use armodafinil, but denies need for refill..  Being evaluated for possible angina/ CAD, pending cardiac CT. Breathing stable with little wheeze or cough. Infrequent use of rescue inhaler. Continues to sleep with O2 2L. CXR 12/02/19- FINDINGS: The heart, hila, mediastinum, lungs, and pleura are unremarkable. The previous pneumonia has resolved. Minimal atelectasis in the left base. IMPRESSION: No active cardiopulmonary disease.  01/17/22- 76 year old male former smoker followed for OSA/oral appliance, COPD/recurrent allergic  Bronchitis-skin test positive, complicated by polycythemia/phlebotomy, HTN, Aortic Ectasia, Hypercholesterolemia, Angina? -Trelegy 100, Proventil HFA, singulair, flonase,    Nuvigil 250 O2 2L sleep/ Advanced             Oral appliance for OSA stopped his snoring. 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 HEENT:   No-  headaches, difficulty swallowing, tooth/dental problems, sore throat,        sneezing, itching, ear ache,  nasal congestion, post nasal drip,  CV:  No-   chest pain, orthopnea, PND, swelling in lower extremities, anasarca, dizziness, palpitations Resp: +shortness of breath with exertion or at rest.              Per HPI;  No-  coughing up of blood.              No-   change in color of mucus.  + wheezing.   Skin: No-   rash or lesions. GI:  No-   heartburn, indigestion, abdominal pain, nausea, vomiting,  GU:  MS:  No-   joint pain or swelling.   Neuro- nothing unusual Psych:  No- change in mood or affect. No depression or anxiety.  No memory loss. Objective:    OBJ- Physical Exam General- Alert, Oriented, Affect-appropriate, Distress- none acute ,+ looks well Skin- rash-none, lesions- none, excoriation- none Lymphadenopathy- none Head- atraumatic            Eyes- Gross vision intact, PERRLA, conjunctivae and secretions clear            Ears- Hearing, canals-normal  Nose- Clear, no-Septal dev, mucus, polyps, erosion, perforation             Throat- Mallampati II , mucosa clear , drainage- none, tonsils- atrophic  Neck- flexible , trachea midline, no stridor , thyroid nl, carotid no bruit Chest - symmetrical excursion , unlabored           Heart/CV- RRR , no murmur ,  no gallop  , no rub, nl s1 s2                           - JVD- none , edema- none, stasis changes- none, varices- none           Lung- +clear, wheeze- none, cough-none , dullness-none, rub- none           Chest wall-  Abd-  Br/ Gen/ Rectal- Not done, not indicated Extrem- cyanosis- none, clubbing, none, atrophy- none, strength- nl Neuro- grossly intact to observation    Assessment & Plan:

## 2022-01-17 ENCOUNTER — Ambulatory Visit: Payer: PPO | Admitting: Internal Medicine

## 2022-01-19 ENCOUNTER — Encounter: Payer: Self-pay | Admitting: Gastroenterology

## 2022-01-20 ENCOUNTER — Telehealth: Payer: Self-pay

## 2022-01-20 DIAGNOSIS — R531 Weakness: Secondary | ICD-10-CM | POA: Diagnosis not present

## 2022-01-20 DIAGNOSIS — R109 Unspecified abdominal pain: Secondary | ICD-10-CM | POA: Diagnosis not present

## 2022-01-20 DIAGNOSIS — R197 Diarrhea, unspecified: Secondary | ICD-10-CM | POA: Diagnosis not present

## 2022-01-20 NOTE — Telephone Encounter (Signed)
John,  ?Please advise if patient is appropriate for [place] if he is on home O2 at night as needed? ?Please advise ? ?FYI-His last 2 colonoscopies were performed at [facility]. ? ?

## 2022-01-20 NOTE — Telephone Encounter (Signed)
Lesly Rubenstein, ? ?This pt uses O2 to supplement CPAP, not for severe COPD.  He is cleared for anesthetic care at [facility].  ? ?Thanks, ? ?Osvaldo Angst ?

## 2022-01-21 NOTE — Telephone Encounter (Signed)
Noted on pre visit chart ?

## 2022-01-24 DIAGNOSIS — I10 Essential (primary) hypertension: Secondary | ICD-10-CM | POA: Diagnosis not present

## 2022-01-24 DIAGNOSIS — R109 Unspecified abdominal pain: Secondary | ICD-10-CM | POA: Diagnosis not present

## 2022-01-24 DIAGNOSIS — R531 Weakness: Secondary | ICD-10-CM | POA: Diagnosis not present

## 2022-01-24 DIAGNOSIS — R197 Diarrhea, unspecified: Secondary | ICD-10-CM | POA: Diagnosis not present

## 2022-01-25 DIAGNOSIS — R531 Weakness: Secondary | ICD-10-CM | POA: Diagnosis not present

## 2022-01-25 DIAGNOSIS — R2681 Unsteadiness on feet: Secondary | ICD-10-CM | POA: Diagnosis not present

## 2022-01-25 DIAGNOSIS — M545 Low back pain, unspecified: Secondary | ICD-10-CM | POA: Diagnosis not present

## 2022-01-26 NOTE — Progress Notes (Signed)
?Subjective:  ? ? Patient ID: Daniel Terrell, male    DOB: 04/10/1946, 76 y.o.   MRN: [id number] ? 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 ? ? ?12/30/20- 76 year old male former smoker followed for OSA/oral appliance, COPD/recurrent allergic Bronchitis-skin test positive, complicated by polycythemia/phlebotomy, HTN, Aortic Ectasia, Hypercholesterolemia, Angina? ?-Trelegy 100, Proventil HFA, singulair, flonase,    Nuvigil 250 ?O2 2L sleep/ Advanced             Oral appliance for OSA stopped his snoring. 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 -----Still having fatigue, no current respiratory complaints.  ?Oral appliance was loosening his teeth so he stopped using it, but didn't call Dr Ron Parker about getting it adjusted. Admits noting more daytime tiredness. No longer using clonazepam, which caused too much morning fatigue.I asked him to talk with his cardiologist about whether Inderal may be contributing, but hard to assess until his OSA is treated, ?Does use armodafinil, but denies need for refill.. ? Being evaluated for possible angina/ CAD, pending cardiac CT. ?Breathing stable with little wheeze or cough. Infrequent use of rescue inhaler. ?Continues to sleep with O2 2L. ?CXR 12/02/19- ?FINDINGS: ?The heart, hila, mediastinum, lungs, and pleura are unremarkable. ?The previous pneumonia has resolved. Minimal atelectasis in the left ?base. ?IMPRESSION: ?No active cardiopulmonary disease. ? ?01/27/22- 76 year old male former smoker followed for OSA/quit oral appliance, COPD/recurrent allergic  Bronchitis-skin test positive, complicated by polycythemia/phlebotomy, HTN, Aortic Ectasia, Hypercholesterolemia, Angina, Covid infection 2020. ?-Trelegy 100, Proventil HFA, singulair, flonase,    Nuvigil 250 ?O2 2L sleep/ Advanced             Oral appliance for OSA stopped his snoring. 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  Stopped wearing oral appliance because he was moving his teeth. ?Had COVID infection in 2020.  Aware of some dyspnea on exertion now without acute event.  Has been much less active since he broke his fibula  stepping off a curb this year and blames lack of exercise.  We discussed whether steroid component and Trelegy could contribute to osteopenia significantly.  I suggested we consider changing to a LABA/LAMA without steroid, but he has felt so much better using Trelegy that he wants to discuss change with his wife first. ?We are going to update PFT.  Did not discuss updating a sleep study on oxygen. ? ?Review of Systems- see HPI + = positive ?Constitutional:   No-   weight loss, night sweats, fevers, chills, +fatigue, lassitude. ?HEENT:   No-  headaches, difficulty swallowing, tooth/dental problems, sore throat,  ?      sneezing, itching, ear ache,  nasal congestion, post nasal drip,  ?CV:  No-   chest pain, orthopnea, PND, swelling in lower extremities, anasarca, dizziness, palpitations ?Resp: +shortness of breath with exertion or at rest.   ?           Per HPI;  No-  coughing up of blood.   ?           No-   change in color of mucus.  +  wheezing.   ?Skin: No-   rash or lesions. ?GI:  No-   heartburn, indigestion, abdominal pain, nausea, vomiting,  ?GU:  ?MS:  No-   joint pain or swelling.   ?Neuro- nothing unusual ?Psych:  No- change in mood or affect. No depression or anxiety.  No memory  loss. ?Objective:  ?  ?OBJ- Physical Exam ?General- Alert, Oriented, Affect-appropriate, Distress- none acute ,+ looks well ?Skin- rash-none, lesions- none, excoriation- none ?Lymphadenopathy- none ?Head- atraumatic ?           Eyes- Gross vision intact, PERRLA, conjunctivae and secretions clear ?           Ears- Hearing, canals-normal ?           Nose- Clear, no-Septal dev, mucus, polyps, erosion, perforation  ?           Throat- Mallampati II , mucosa clear , drainage- none, tonsils- atrophic  ?Neck- flexible , trachea midline, no stridor , thyroid nl, carotid no bruit ?Chest - symmetrical excursion , unlabored ?          Heart/CV- RRR , no murmur , no gallop  , no rub, nl s1 s2 ?                          - JVD- none , edema- none, stasis changes- none, varices- none ?          Lung- +clear, wheeze- none, cough-none , dullness-none, rub- none ?          Chest wall-  ?Abd-  ?Br/ Gen/ Rectal- Not done, not indicated ?Extrem- +brace R ankle ?Neuro- grossly intact to observation ? ? ? ?Assessment & Plan:  ? ?

## 2022-01-27 ENCOUNTER — Encounter: Payer: Self-pay | Admitting: Internal Medicine

## 2022-01-27 ENCOUNTER — Ambulatory Visit: Payer: PPO | Admitting: Internal Medicine

## 2022-01-27 ENCOUNTER — Ambulatory Visit (AMBULATORY_SURGERY_CENTER): Payer: PPO | Admitting: *Deleted

## 2022-01-27 VITALS — Ht 69.0 in | Wt 200.0 lb

## 2022-01-27 VITALS — BP 138/80 | HR 69 | Temp 98.2°F | Ht 69.0 in | Wt 204.0 lb

## 2022-01-27 DIAGNOSIS — R0609 Other forms of dyspnea: Secondary | ICD-10-CM | POA: Diagnosis not present

## 2022-01-27 DIAGNOSIS — G4733 Obstructive sleep apnea (adult) (pediatric): Secondary | ICD-10-CM

## 2022-01-27 DIAGNOSIS — J449 Chronic obstructive pulmonary disease, unspecified: Secondary | ICD-10-CM | POA: Diagnosis not present

## 2022-01-27 DIAGNOSIS — Z1211 Encounter for screening for malignant neoplasm of colon: Secondary | ICD-10-CM

## 2022-01-27 MED ORDER — PEG 3350-KCL-NA BICARB-NACL 420 G PO SOLR: 4000.0000 mL | Freq: Once | ORAL | 0 refills | Status: AC

## 2022-01-27 NOTE — Progress Notes (Signed)
No egg or soy allergy known to patient  ?No issues known to pt with past sedation with any surgeries or procedures ?Patient denies ever being told they had issues or difficulty with intubation  ?No FH of Malignant Hyperthermia ?Pt is not on diet pills ?Pt is not on  home 02  ?Pt is not on blood thinners  ?Pt denies issues with constipation  ?No A fib or A flutter ? ? ?PV completed over the phone. Pt verified name, DOB, address and insurance during PV today.  ? ?Pt encouraged to call with questions or issues.  ?If pt has My chart, procedure instructions sent via My Chart  ?Insurance confirmed with pt at [facility] today   ?

## 2022-01-27 NOTE — Assessment & Plan Note (Signed)
Failed to tolerate CPAP.  Quit oral appliance because of loosened teeth. ?Plan-consider reassessment and possibility of Inspire ?

## 2022-01-27 NOTE — Assessment & Plan Note (Signed)
Mild DOE which he associates with being sedentary in the last couple of months after leg fracture.  No acute event.  Known mild obstructive airways disease with past history of smoking and response to bronchodilator. ?Plan-schedule PFT.  Consider change from Trelegy to remove steroid component if there is active concern this is affecting osteopenia. ?

## 2022-01-27 NOTE — Patient Instructions (Signed)
Order- schedule PFT    dx dyspnea on exertion ? ?Ok to continue O2 for sleep ? ?We can consider changing Trelegy to an inhaler with 2 of its 3 meds, leaving out the steroid component, if you want to try it.  ? ?Please call if we can help ?

## 2022-02-09 ENCOUNTER — Encounter: Payer: Self-pay | Admitting: *Deleted

## 2022-02-12 ENCOUNTER — Encounter: Payer: Self-pay | Admitting: Gastroenterology

## 2022-02-14 ENCOUNTER — Ambulatory Visit: Payer: PPO | Admitting: Diagnostic Neuroimaging

## 2022-02-14 ENCOUNTER — Encounter: Payer: Self-pay | Admitting: Diagnostic Neuroimaging

## 2022-02-14 VITALS — BP 113/72 | HR 74 | Ht 69.0 in | Wt 204.0 lb

## 2022-02-14 DIAGNOSIS — R42 Dizziness and giddiness: Secondary | ICD-10-CM

## 2022-02-14 DIAGNOSIS — R251 Tremor, unspecified: Secondary | ICD-10-CM

## 2022-02-14 NOTE — Patient Instructions (Signed)
ESSENTIAL TREMOR  - has tried propranolol '80mg'$  daily; also primidone in past - consider gabapentin; consider DBS evaluation - consider cala trio stim device (intention to treat left hand tremor first)  BALANCE, DIZZINESS, TREMOR - check MRI brain and labs to rule out other causes

## 2022-02-14 NOTE — Progress Notes (Signed)
[facility]  PATIENT: Daniel Terrell DOB: 07-18-1946  REFERRING CLINICIAN: Moon, Amy A, NP HISTORY FROM: patient  REASON FOR VISIT: new consult   HISTORICAL  CHIEF COMPLAINT:  Chief Complaint  Patient presents with   New Patient (Initial Visit)    Rm 7 alone here for consult on worsening tremors-  Prior pt of Dr. Jannifer Franklin pt reports trouble with hand tremors over the last 13+ years. Interested in the cala trio device. Left hand is worse than the right.     HISTORY OF PRESENT ILLNESS:   76 year old male here for evaluation of tremor.  Patient reports mild postural and action tremor of right upper extremity since 2010.  This is gradual progressed to left side as well.  Patient has tried propranolol in the past with mild benefit.  Tried primidone but this caused side effect.  Mother had essential tremor.  Triggering factors include lack of sleep and stress.  Patient does not drink alcohol.  Symptoms affecting handwriting and other fine motor tasks.  Has difficulty with fine motor skills.  This is affecting his quality of life and ADLs.  Patient interested in transcutaneous stimulator device.   REVIEW OF SYSTEMS: Full 14 system review of systems performed and negative with exception of: as per HPI.  ALLERGIES: Allergies  Allergen Reactions   Rosuvastatin Calcium Other (See Comments)    Muscle pain   Amoxicillin    Avelox [Moxifloxacin]    Levofloxacin     REACTION: disorientation    Ofloxacin     REACTION: disorientation     Topamax [Topiramate]     drowsy    HOME MEDICATIONS: Outpatient Medications Prior to Visit  Medication Sig Dispense Refill   albuterol (PROVENTIL HFA;VENTOLIN HFA) 108 (90 BASE) MCG/ACT inhaler Inhale 1-2 puffs into the lungs every 6 (six) hours as needed for wheezing or shortness of breath.     Armodafinil 250 MG tablet 1/2 or 1 tab daily as needed for sleepiness 90 tablet 1   aspirin 81 MG tablet Take 81 mg by mouth daily.      cetirizine (ZYRTEC) 10 MG tablet Take 10 mg by mouth daily.     cholecalciferol (VITAMIN D3) 25 MCG (1000 UT) tablet Take 5,000 Units by mouth daily.     Choline Fenofibrate (FENOFIBRIC ACID) 135 MG CPDR Take 1 capsule by mouth daily.      Fluticasone-Umeclidin-Vilant (TRELEGY ELLIPTA) 100-62.5-25 MCG/ACT AEPB Inhale 1 puff by mouth into the lungs daily (need office visit for further refills) 180 each 3   glucosamine-chondroitin 500-400 MG tablet Take 1 tablet by mouth 2 (two) times daily.     ipratropium-albuterol (DUONEB) 0.5-2.5 (3) MG/3ML SOLN Take 3 mLs by nebulization.     meloxicam (MOBIC) 15 MG tablet Take 15 mg by mouth daily.     montelukast (SINGULAIR) 10 MG tablet Take 10 mg by mouth daily.     Multiple Vitamins-Minerals (MULTIVITAL PO) Take 1 tablet by mouth daily.     olmesartan (BENICAR) 40 MG tablet Take 40 mg by mouth daily.     omeprazole (PRILOSEC) 20 MG capsule Take 20 mg by mouth daily.     PARoxetine (PAXIL) 30 MG tablet Take 60 mg by mouth daily.     PRESCRIPTION MEDICATION Nocturnal oxygen 2 L at night. Per Dr. Keturah Barre     Probiotic Product (PROBIOTIC-10 PO) Take by mouth.     propranolol ER (INDERAL LA) 80 MG 24 hr capsule Take 1 capsule (80 mg total) by  mouth daily. 30 capsule 6   fluticasone (FLONASE) 50 MCG/ACT nasal spray Place 2 sprays into the nose daily as needed.     nitroGLYCERIN (NITROSTAT) 0.4 MG SL tablet Place 1 tablet (0.4 mg total) under the tongue every 5 (five) minutes as needed for chest pain. 25 tablet 3   AMBULATORY NON FORMULARY MEDICATION 0.2 mLs by Intracavernosal route as needed. Medication Name: Triple mix 0.2 ml Intracavernosal PRN PGE 30 mch Pap 9m Phent 1 mg 5 mL 3   rosuvastatin (CRESTOR) 10 MG tablet Take 1 tablet (10 mg total) by mouth daily. 90 tablet 3   No facility-administered medications prior to visit.      PHYSICAL EXAM  GENERAL EXAM/CONSTITUTIONAL: Vitals:  Vitals:   02/14/22 1005  BP: 113/72  Pulse: 74  Weight:  204 lb (92.5 kg)  Height: '5\' 9"'  (1.753 m)   Body mass index is 30.13 kg/m. Wt Readings from Last 3 Encounters:  02/14/22 204 lb (92.5 kg)  01/27/22 200 lb (90.7 kg)  01/27/22 204 lb (92.5 kg)   Patient is in no distress; well developed, nourished and groomed; neck is supple  CARDIOVASCULAR: Examination of carotid arteries is normal; no carotid bruits Regular rate and rhythm, no murmurs Examination of peripheral vascular system by observation and palpation is normal  EYES: Ophthalmoscopic exam of optic discs and posterior segments is normal; no papilledema or hemorrhages No results found.  MUSCULOSKELETAL: Gait, strength, tone, movements noted in Neurologic exam below  NEUROLOGIC: MENTAL STATUS:      View : No data to display.         awake, alert, oriented to person, place and time recent and remote memory intact normal attention and concentration language fluent, comprehension intact, naming intact fund of knowledge appropriate  CRANIAL NERVE:  2nd - no papilledema on fundoscopic exam 2nd, 3rd, 4th, 6th - pupils equal and reactive to light, visual fields full to confrontation, extraocular muscles intact, no nystagmus 5th - facial sensation symmetric 7th - facial strength symmetric 8th - hearing intact 9th - palate elevates symmetrically, uvula midline 11th - shoulder shrug symmetric 12th - tongue protrusion midline  MOTOR:  normal bulk and tone, full strength in the BUE, BLE MILD POSTURAL AND ACTION TREMOR IN BUE (LUE > RUE)  SENSORY:  normal and symmetric to light touch, temperature, vibration  COORDINATION:  finger-nose-finger, fine finger movements normal  REFLEXES:  deep tendon reflexes present and symmetric  GAIT/STATION:  narrow based gait     DIAGNOSTIC DATA (LABS, IMAGING, TESTING) - I reviewed patient records, labs, notes, testing and imaging myself where available.  Lab Results  Component Value Date   WBC 4.3 (L) 02/18/2011   HGB  16.6 02/18/2011   HCT 47.3 02/18/2011   MCV 94.7 02/18/2011   PLT 192.0 02/18/2011      Component Value Date/Time   NA 138 12/31/2020 1006   K 4.7 12/31/2020 1006   CL 103 12/31/2020 1006   CO2 22 12/31/2020 1006   GLUCOSE 84 12/31/2020 1006   GLUCOSE 94 02/18/2011 1351   BUN 28 (H) 12/31/2020 1006   CREATININE 1.08 12/31/2020 1006   CALCIUM 9.2 12/31/2020 1006   PROT 6.9 02/18/2011 1351   ALBUMIN 4.1 02/18/2011 1351   AST 21 02/18/2011 1351   ALT 31 02/18/2011 1351   ALKPHOS 73 02/18/2011 1351   BILITOT 0.5 02/18/2011 1351   GFRNONAA >60 10/16/2007 0609   GFRAA  10/16/2007 0609    >60  The eGFR has been calculated using the MDRD equation. This calculation has not been validated in all clinical   No results found for: CHOL, HDL, LDLCALC, LDLDIRECT, TRIG, CHOLHDL No results found for: HGBA1C No results found for: VITAMINB12 No results found for: TSH     ASSESSMENT AND PLAN  76 y.o. year old male here with:   Dx:  1. Tremor   2. Dizziness     PLAN:  ESSENTIAL TREMOR (since 2010) - has tried propranolol 4m daily; also primidone in past (side effects) - consider gabapentin; consider DBS evaluation (he is reluctant to pursue) - will order cala trio stim device (intention to treat left hand tremor first)  BALANCE, DIZZINESS, TREMOR - check MRI brain and labs to rule out other causes  Orders Placed This Encounter  Procedures   MR BRAIN W WO CONTRAST   TSH   Vitamin B12   Return for pending if symptoms worsen or fail to improve, pending test results.    VPenni Bombard MD 59/43/7005 125:91AM Certified in Neurology, Neurophysiology and Neuroimaging  [facility] [address] [place] [phone]

## 2022-02-15 ENCOUNTER — Telehealth: Payer: Self-pay | Admitting: Diagnostic Neuroimaging

## 2022-02-15 LAB — TSH: TSH: 1.79 u[IU]/mL (ref 0.450–4.500)

## 2022-02-15 LAB — VITAMIN B12: Vitamin B-12: >2000 pg/mL — ABNORMAL HIGH (ref 232–1245)

## 2022-02-15 NOTE — Telephone Encounter (Signed)
healthteam adv NPR sent to GI they will call the patient to schedule

## 2022-02-21 ENCOUNTER — Encounter: Payer: Self-pay | Admitting: Certified Registered Nurse Anesthetist

## 2022-02-22 ENCOUNTER — Encounter: Payer: Self-pay | Admitting: Gastroenterology

## 2022-02-22 DIAGNOSIS — S82831D Other fracture of upper and lower end of right fibula, subsequent encounter for closed fracture with routine healing: Secondary | ICD-10-CM | POA: Diagnosis not present

## 2022-02-25 ENCOUNTER — Ambulatory Visit (AMBULATORY_SURGERY_CENTER): Payer: PPO | Admitting: Gastroenterology

## 2022-02-25 ENCOUNTER — Encounter: Payer: Self-pay | Admitting: Gastroenterology

## 2022-02-25 VITALS — BP 169/85 | HR 60 | Temp 98.0°F | Resp 15 | Ht 70.0 in | Wt 200.0 lb

## 2022-02-25 DIAGNOSIS — D122 Benign neoplasm of ascending colon: Secondary | ICD-10-CM | POA: Diagnosis not present

## 2022-02-25 DIAGNOSIS — D123 Benign neoplasm of transverse colon: Secondary | ICD-10-CM | POA: Diagnosis not present

## 2022-02-25 DIAGNOSIS — D124 Benign neoplasm of descending colon: Secondary | ICD-10-CM | POA: Diagnosis not present

## 2022-02-25 DIAGNOSIS — Z1211 Encounter for screening for malignant neoplasm of colon: Secondary | ICD-10-CM

## 2022-02-25 DIAGNOSIS — D120 Benign neoplasm of cecum: Secondary | ICD-10-CM | POA: Diagnosis not present

## 2022-02-25 DIAGNOSIS — D125 Benign neoplasm of sigmoid colon: Secondary | ICD-10-CM | POA: Diagnosis not present

## 2022-02-25 DIAGNOSIS — J449 Chronic obstructive pulmonary disease, unspecified: Secondary | ICD-10-CM | POA: Diagnosis not present

## 2022-02-25 DIAGNOSIS — J45909 Unspecified asthma, uncomplicated: Secondary | ICD-10-CM | POA: Diagnosis not present

## 2022-02-25 HISTORY — PX: COLONOSCOPY: SHX174

## 2022-02-25 MED ORDER — SODIUM CHLORIDE 0.9 % IV SOLN: 500.0000 mL | Freq: Once | INTRAVENOUS | Status: DC

## 2022-02-25 NOTE — Patient Instructions (Signed)
Read all of the handouts given to you by your recovery room nurse. ? ?YOU HAD AN ENDOSCOPIC PROCEDURE TODAY AT THE [facility]:   Refer to the procedure report that was given to you for any specific questions about what was found during the examination.  If the procedure report does not answer your questions, please call your gastroenterologist to clarify.  If you requested that your care partner not be given the details of your procedure findings, then the procedure report has been included in a sealed envelope for you to review at your convenience later. ? ?YOU SHOULD EXPECT: Some feelings of bloating in the abdomen. Passage of more gas than usual.  Walking can help get rid of the air that was put into your GI tract during the procedure and reduce the bloating. If you had a lower endoscopy (such as a colonoscopy or flexible sigmoidoscopy) you may notice spotting of blood in your stool or on the toilet paper. If you underwent a bowel prep for your procedure, you may not have a normal bowel movement for a few days. ? ?Please Note:  You might notice some irritation and congestion in your nose or some drainage.  This is from the oxygen used during your procedure.  There is no need for concern and it should clear up in a day or so. ? ?SYMPTOMS TO REPORT IMMEDIATELY: ? ?Following lower endoscopy (colonoscopy or flexible sigmoidoscopy): ? Excessive amounts of blood in the stool ? Significant tenderness or worsening of abdominal pains ? Swelling of the abdomen that is new, acute ? Fever of 100?F or higher ? ?  ?For urgent or emergent issues, a gastroenterologist can be reached at any hour by calling [phone]. ?Do not use MyChart messaging for urgent concerns.  ? ? ?DIET:  We do recommend a small meal at first, but then you may proceed to your regular diet.  Drink plenty of fluids but you should avoid alcoholic beverages for 24 hours. ? ?ACTIVITY:  You should plan to take it easy for the rest of today  and you should NOT DRIVE or use heavy machinery until tomorrow (because of the sedation medicines used during the test).   ? ?FOLLOW UP: ?Our staff will call the number listed on your records 48-72 hours following your procedure to check on you and address any questions or concerns that you may have regarding the information given to you following your procedure. If we do not reach you, we will leave a message.  We will attempt to reach you two times.  During this call, we will ask if you have developed any symptoms of COVID 19. If you develop any symptoms (ie: fever, flu-like symptoms, shortness of breath, cough etc.) before then, please call [phone].  If you test positive for Covid 19 in the 2 weeks post procedure, please call and report this information to us.   ? ?If any biopsies were taken you will be contacted by phone or by letter within the next 1-3 weeks.  Please call us at [phone] if you have not heard about the biopsies in 3 weeks.  ? ? ?SIGNATURES/CONFIDENTIALITY: ?You and/or your care partner have signed paperwork which will be entered into your electronic medical record.  These signatures attest to the fact that that the information above on your After Visit Summary has been reviewed and is understood.  Full responsibility of the confidentiality of this discharge information lies with you and/or your care-partner.  ?

## 2022-02-25 NOTE — Op Note (Signed)
[facility] Patient Name: Daniel Terrell Procedure Date: 02/25/2022 1:58 PM MRN: [id number] Endoscopist: Milus Banister , MD Age: 76 Referring MD:  Date of Birth: 02/08/1946 Gender: Male Account #: [id number] Procedure:                Colonoscopy Indications:              Screening for colorectal malignant neoplasm Medicines:                Monitored Anesthesia Care Procedure:                Pre-Anesthesia Assessment:                           - Prior to the procedure, a History and Physical                            was performed, and patient medications and                            allergies were reviewed. The patient's tolerance of                            previous anesthesia was also reviewed. The risks                            and benefits of the procedure and the sedation                            options and risks were discussed with the patient.                            All questions were answered, and informed consent                            was obtained. Prior Anticoagulants: The patient has                            taken no previous anticoagulant or antiplatelet                            agents. ASA Grade Assessment: II - A patient with                            mild systemic disease. After reviewing the risks                            and benefits, the patient was deemed in                            satisfactory condition to undergo the procedure.                           After obtaining informed consent, the colonoscope  was passed under direct vision. Throughout the                            procedure, the patient's blood pressure, pulse, and                            oxygen saturations were monitored continuously. The                            CF [id number] #[id number] was introduced through the anus                            and advanced to the the cecum, identified by                            appendiceal orifice  and ileocecal valve. The                            colonoscopy was performed without difficulty. The                            patient tolerated the procedure well. The quality                            of the bowel preparation was good. The ileocecal                            valve, appendiceal orifice, and rectum were                            photographed. Scope In: 2:06:34 PM Scope Out: 2:25:47 PM Scope Withdrawal Time: 0 hours 17 minutes 11 seconds  Total Procedure Duration: 0 hours 19 minutes 13 seconds  Findings:                 Forteen sessile polyps were found in the sigmoid                            colon, descending colon, transverse colon,                            ascending colon and cecum. The polyps were 2 to 8                            mm in size. These polyps were removed with a cold                            snare. Resection and retrieval were complete.                           Multiple small and large-mouthed diverticula were                            found in the left colon.  The exam was otherwise without abnormality on                            direct and retroflexion views. Complications:            No immediate complications. Estimated blood loss:                            None. Estimated Blood Loss:     Estimated blood loss: none. Impression:               - Forteen 2 to 8 mm polyps in the sigmoid colon, in                            the descending colon, in the transverse colon, in                            the ascending colon and in the cecum, removed with                            a cold snare. Resected and retrieved.                           - Diverticulosis in the left colon.                           - The examination was otherwise normal on direct                            and retroflexion views. Recommendation:           - Patient has a contact number available for                            emergencies. The  signs and symptoms of potential                            delayed complications were discussed with the                            patient. Return to normal activities tomorrow.                            Written discharge instructions were provided to the                            patient.                           - Resume previous diet.                           - Continue present medications.                           - Await pathology results. Milus Banister, MD 02/25/2022 2:29:09  PM This report has been signed electronically.

## 2022-02-25 NOTE — Progress Notes (Signed)
HPI: This is a man at routine risk for [facility]   Colonoscopy Dr. Lyla Son 2005 no polyps. Colonoscopy Dr. Lyla Son no polyps   ROS: complete GI ROS as described in HPI, all other review negative.  Constitutional:  No unintentional weight loss   Past Medical History:  Diagnosis Date   Arthritis    Asthma    Colon polyps    Complication of anesthesia    COPD (chronic obstructive pulmonary disease) (HCC)    Essential tremor    GERD (gastroesophageal reflux disease)    Hypertriglyceridemia    IBS (irritable bowel syndrome)    OCD (obsessive compulsive disorder)    Oxygen deficiency    uses O2 at night for COPD   PONV (postoperative nausea and vomiting)    Prostate cancer ([place])    Sleep apnea     Past Surgical History:  Procedure Laterality Date   BLADDER SURGERY     extension   COLONOSCOPY  08/13/2009   FINGER SURGERY     left x 3   GREAT TOE ARTHRODESIS, INTERPHALANGEAL JOINT     KNEE ARTHROSCOPY     left   PROSTATECTOMY     TONSILLECTOMY      Current Outpatient Medications  Medication Sig Dispense Refill   aspirin 81 MG tablet Take 81 mg by mouth daily.     cetirizine (ZYRTEC) 10 MG tablet Take 10 mg by mouth daily.     cholecalciferol (VITAMIN D3) 25 MCG (1000 UT) tablet Take 5,000 Units by mouth daily.     Choline Fenofibrate (FENOFIBRIC ACID) 135 MG CPDR Take 1 capsule by mouth daily.      fluticasone (FLONASE) 50 MCG/ACT nasal spray Place 2 sprays into the nose daily as needed.     Fluticasone-Umeclidin-Vilant (TRELEGY ELLIPTA) 100-62.5-25 MCG/ACT AEPB Inhale 1 puff by mouth into the lungs daily (need office visit for further refills) 180 each 3   glucosamine-chondroitin 500-400 MG tablet Take 1 tablet by mouth 2 (two) times daily.     ipratropium-albuterol (DUONEB) 0.5-2.5 (3) MG/3ML SOLN Take 3 mLs by nebulization.     meloxicam (MOBIC) 15 MG tablet Take 15 mg by mouth daily.     montelukast (SINGULAIR) 10 MG tablet Take 10 mg by mouth daily.      Multiple Vitamins-Minerals (MULTIVITAL PO) Take 1 tablet by mouth daily.     olmesartan (BENICAR) 40 MG tablet Take 40 mg by mouth daily.     omeprazole (PRILOSEC) 20 MG capsule Take 20 mg by mouth daily.     PARoxetine (PAXIL) 30 MG tablet Take 60 mg by mouth daily.     PRESCRIPTION MEDICATION Nocturnal oxygen 2 L at night. Per Dr. Keturah Barre     Probiotic Product (PROBIOTIC-10 PO) Take by mouth.     propranolol ER (INDERAL LA) 80 MG 24 hr capsule Take 1 capsule (80 mg total) by mouth daily. 30 capsule 6   albuterol (PROVENTIL HFA;VENTOLIN HFA) 108 (90 BASE) MCG/ACT inhaler Inhale 1-2 puffs into the lungs every 6 (six) hours as needed for wheezing or shortness of breath.     Armodafinil 250 MG tablet 1/2 or 1 tab daily as needed for sleepiness 90 tablet 1   nitroGLYCERIN (NITROSTAT) 0.4 MG SL tablet Place 1 tablet (0.4 mg total) under the tongue every 5 (five) minutes as needed for chest pain. 25 tablet 3   Current Facility-Administered Medications  Medication Dose Route Frequency Provider Last Rate Last Admin   0.9 %  sodium chloride  infusion  500 mL Intravenous Once Milus Banister, MD        Allergies as of 02/25/2022 - Review Complete 02/25/2022  Allergen Reaction Noted   Rosuvastatin calcium Other (See Comments) 02/23/2021   Amoxicillin     Avelox [moxifloxacin]  06/03/2014   Levofloxacin  01/06/2009   Ofloxacin  01/06/2009   Topamax [topiramate]  03/22/2017    Family History  Problem Relation Age of Onset   Colon polyps Mother        son   Tremor Mother    Breast cancer Mother    Heart attack Father    Alcohol abuse Father    Heart disease Father    Breast cancer Maternal Grandmother    Colon cancer Maternal Grandfather    Esophageal cancer Neg Hx    Stomach cancer Neg Hx    Rectal cancer Neg Hx     Social History   Socioeconomic History   Marital status: Married    Spouse name: Denice Paradise   Number of children: 3   Years of education: BA   Highest education level:  Not on file  Occupational History   Occupation: Theme park manager   Occupation: Retired  Tobacco Use   Smoking status: Former    Types: Cigarettes    Quit date: 09/26/1970    Years since quitting: 51.4   Smokeless tobacco: Never  Vaping Use   Vaping Use: Never used  Substance and Sexual Activity   Alcohol use: No   Drug use: No   Sexual activity: Not on file  Other Topics Concern   Not on file  Social History Narrative   Patient is married with 3 children.   Patient is right handed.   Patient has a Bachelor's degree.   Patient drinks 2-3 cups daily.      Social Determinants of Health   Financial Resource Strain: Not on file  Food Insecurity: Not on file  Transportation Needs: Not on file  Physical Activity: Not on file  Stress: Not on file  Social Connections: Not on file  Intimate Partner Violence: Not on file     Physical Exam: BP 140/70   Pulse 61   Temp 98 F (36.7 C) (Skin)   Ht '5\' 10"'$  (1.778 m)   Wt 200 lb (90.7 kg)   SpO2 97%   BMI 28.70 kg/m  Constitutional: generally well-appearing Psychiatric: alert and oriented x3 Lungs: CTA bilaterally Heart: no MCR  Assessment and plan: 76 y.o. male with routine risk for CRC  Screening colonoscopy today  Care is appropriate for the ambulatory setting.  Owens Loffler, MD [facility] 02/25/2022, 1:56 PM

## 2022-02-25 NOTE — Progress Notes (Signed)
Report given to PACU, vss 

## 2022-02-25 NOTE — Progress Notes (Signed)
Pt's states no medical or surgical changes since previsit or office visit. 

## 2022-02-25 NOTE — Progress Notes (Signed)
Called to room to assist during endoscopic procedure.  Patient ID and intended procedure confirmed with present staff. Received instructions for my participation in the procedure from the performing physician.  

## 2022-02-27 ENCOUNTER — Ambulatory Visit
Admission: RE | Admit: 2022-02-27 | Discharge: 2022-02-27 | Disposition: A | Discharge status: Home / Self Care | Payer: PPO | Source: Ambulatory Visit | Attending: Diagnostic Neuroimaging | Admitting: Diagnostic Neuroimaging

## 2022-02-27 DIAGNOSIS — R42 Dizziness and giddiness: Secondary | ICD-10-CM

## 2022-02-27 DIAGNOSIS — R251 Tremor, unspecified: Secondary | ICD-10-CM | POA: Diagnosis not present

## 2022-02-27 MED ORDER — GADOBENATE DIMEGLUMINE 529 MG/ML IV SOLN
19.0000 mL | Freq: Once | INTRAVENOUS | Status: AC | PRN
  Administered 2022-02-27: 19 mL via INTRAVENOUS

## 2022-02-28 ENCOUNTER — Telehealth: Payer: Self-pay

## 2022-02-28 NOTE — Telephone Encounter (Signed)
  Follow up Call-     02/25/2022    1:37 PM  Call back number  Post procedure Call Back phone  # [phone]  Permission to leave phone message Yes     Patient questions:  Do you have a fever, pain , or abdominal swelling? No. Pain Score  0 *  Have you tolerated food without any problems? Yes.    Have you been able to return to your normal activities? Yes.    Do you have any questions about your discharge instructions: Diet   No. Medications  No. Follow up visit  No.  Do you have questions or concerns about your Care? No.  Actions: * If pain score is 4 or above: No action needed, pain <4.

## 2022-03-07 ENCOUNTER — Encounter: Payer: Self-pay | Admitting: Diagnostic Neuroimaging

## 2022-03-15 ENCOUNTER — Encounter: Payer: Self-pay | Admitting: Gastroenterology

## 2022-03-28 ENCOUNTER — Telehealth: Payer: Self-pay | Admitting: Diagnostic Neuroimaging

## 2022-03-28 NOTE — Telephone Encounter (Signed)
Rescheduled 7/11 appointment with pt over the phone - MD out

## 2022-04-05 ENCOUNTER — Ambulatory Visit: Payer: PPO | Admitting: Diagnostic Neuroimaging

## 2022-04-26 ENCOUNTER — Ambulatory Visit (INDEPENDENT_AMBULATORY_CARE_PROVIDER_SITE_OTHER): Payer: PPO | Admitting: Diagnostic Neuroimaging

## 2022-04-26 ENCOUNTER — Encounter: Payer: Self-pay | Admitting: Diagnostic Neuroimaging

## 2022-04-26 VITALS — BP 131/80 | HR 79 | Ht 69.0 in | Wt 203.8 lb

## 2022-04-26 DIAGNOSIS — J449 Chronic obstructive pulmonary disease, unspecified: Secondary | ICD-10-CM | POA: Diagnosis not present

## 2022-04-26 DIAGNOSIS — I10 Essential (primary) hypertension: Secondary | ICD-10-CM | POA: Diagnosis not present

## 2022-04-26 DIAGNOSIS — R413 Other amnesia: Secondary | ICD-10-CM

## 2022-04-26 DIAGNOSIS — R251 Tremor, unspecified: Secondary | ICD-10-CM

## 2022-04-26 DIAGNOSIS — I77811 Abdominal aortic ectasia: Secondary | ICD-10-CM | POA: Diagnosis not present

## 2022-04-26 DIAGNOSIS — D582 Other hemoglobinopathies: Secondary | ICD-10-CM | POA: Diagnosis not present

## 2022-04-26 DIAGNOSIS — M545 Low back pain, unspecified: Secondary | ICD-10-CM | POA: Diagnosis not present

## 2022-04-26 DIAGNOSIS — F330 Major depressive disorder, recurrent, mild: Secondary | ICD-10-CM | POA: Diagnosis not present

## 2022-04-26 DIAGNOSIS — R42 Dizziness and giddiness: Secondary | ICD-10-CM

## 2022-04-26 DIAGNOSIS — E785 Hyperlipidemia, unspecified: Secondary | ICD-10-CM | POA: Diagnosis not present

## 2022-04-26 DIAGNOSIS — E559 Vitamin D deficiency, unspecified: Secondary | ICD-10-CM | POA: Diagnosis not present

## 2022-04-26 DIAGNOSIS — G250 Essential tremor: Secondary | ICD-10-CM | POA: Diagnosis not present

## 2022-04-26 DIAGNOSIS — E538 Deficiency of other specified B group vitamins: Secondary | ICD-10-CM | POA: Diagnosis not present

## 2022-04-26 NOTE — Progress Notes (Signed)
[facility]  PATIENT: Daniel Terrell DOB: [birth year]  REFERRING CLINICIAN: Nicoletta Dress, MD HISTORY FROM: patient  REASON FOR VISIT: follow up   HISTORICAL  CHIEF COMPLAINT:  Chief Complaint  Patient presents with   Tremors    Rm 6, wife- Denice Paradise,  "FU to discuss results, medications, no change in symptoms"  MMSE 30    HISTORY OF PRESENT ILLNESS:   UPDATE (04/26/22, VRP): Since last visit, doing about the same for tremor. Here for new issue: concern about memory loss per wife. Causing some marital strain.  According to wife patient having short-term memory loss, struggling with driving directions.  Patient does acknowledge some memory loss issues but he thinks these are mild.  He denies any driving issues problem.  This often leads to pain getting short fused and short tempered with wife.  She notes that even the slightest question or interruption can lead him to get agitated.  She notes that this has been the case in the past over there 51-year marriage but is much worse in the last couple of years.  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  PRIOR HPI: 77 year old male here for evaluation of tremor.  Patient reports mild postural and action tremor of right upper extremity since 2010.  This is gradual progressed to left side as well.  Patient has tried propranolol in the past with mild benefit.  Tried primidone but this caused side effect.  Mother had essential tremor.  Triggering factors include lack of sleep and stress.  Patient does not drink alcohol.  Symptoms affecting handwriting and other fine motor tasks.  Has difficulty with fine motor skills.  This is affecting his quality of life and ADLs.  Patient interested in transcutaneous stimulator device.   REVIEW OF SYSTEMS: Full 14 system review of systems performed and negative with exception of: as per  HPI.  ALLERGIES: Allergies  Allergen Reactions   Rosuvastatin Calcium Other (See Comments)    Muscle pain   Amoxicillin    Avelox [Moxifloxacin]    Levofloxacin     REACTION: disorientation    Ofloxacin     REACTION: disorientation     Topamax [Topiramate]     drowsy    HOME MEDICATIONS: Outpatient Medications Prior to Visit  Medication Sig Dispense Refill   albuterol (PROVENTIL HFA;VENTOLIN HFA) 108 (90 BASE) MCG/ACT inhaler Inhale 1-2 puffs into the lungs every 6 (six) hours as needed for wheezing or shortness of breath.     amLODipine (NORVASC) 5 MG tablet Take 5 mg by mouth daily.     Armodafinil 250 MG tablet 1/2 or 1 tab daily as needed for sleepiness 90 tablet 1   aspirin 81 MG tablet Take 81 mg by mouth daily.     cetirizine (ZYRTEC) 10 MG tablet Take 10 mg by mouth daily.     cholecalciferol (VITAMIN D3) 25 MCG (1000 UT) tablet Take 5,000 Units by mouth daily.     Choline Fenofibrate (FENOFIBRIC ACID) 135 MG CPDR Take 1 capsule by mouth daily.      Fluticasone-Umeclidin-Vilant (TRELEGY ELLIPTA) 100-62.5-25 MCG/ACT AEPB Inhale 1 puff by mouth into the lungs daily (need office visit for further refills) 180 each 3   glucosamine-chondroitin 500-400 MG tablet Take 1 tablet by mouth 2 (two) times daily.     ipratropium-albuterol (DUONEB) 0.5-2.5 (3) MG/3ML SOLN Take 3 mLs by nebulization.     meloxicam (  MOBIC) 15 MG tablet Take 15 mg by mouth daily.     montelukast (SINGULAIR) 10 MG tablet Take 10 mg by mouth daily.     Multiple Vitamins-Minerals (MULTIVITAL PO) Take 1 tablet by mouth daily.     omeprazole (PRILOSEC) 20 MG capsule Take 20 mg by mouth daily.     PARoxetine (PAXIL) 30 MG tablet Take 60 mg by mouth daily.     PRESCRIPTION MEDICATION Nocturnal oxygen 2 L at night. Per Dr. Keturah Barre     Probiotic Product (PROBIOTIC-10 PO) Take by mouth.     propranolol ER (INDERAL LA) 80 MG 24 hr capsule Take 1 capsule (80 mg total) by mouth daily. 30 capsule 6   olmesartan  (BENICAR) 40 MG tablet Take 40 mg by mouth daily.     fluticasone (FLONASE) 50 MCG/ACT nasal spray Place 2 sprays into the nose daily as needed.     nitroGLYCERIN (NITROSTAT) 0.4 MG SL tablet Place 1 tablet (0.4 mg total) under the tongue every 5 (five) minutes as needed for chest pain. 25 tablet 3   Facility-Administered Medications Prior to Visit  Medication Dose Route Frequency Provider Last Rate Last Admin   0.9 %  sodium chloride infusion  500 mL Intravenous Once Milus Banister, MD          PHYSICAL EXAM  GENERAL EXAM/CONSTITUTIONAL: Vitals:  Vitals:   04/26/22 1514  BP: 131/80  Pulse: 79  Weight: 203 lb 12.8 oz (92.4 kg)  Height: 5' 9" (1.753 m)   Body mass index is 30.1 kg/m. Wt Readings from Last 3 Encounters:  04/26/22 203 lb 12.8 oz (92.4 kg)  02/25/22 200 lb (90.7 kg)  02/14/22 204 lb (92.5 kg)   Patient is in no distress; well developed, nourished and groomed; neck is supple  CARDIOVASCULAR: Examination of carotid arteries is normal; no carotid bruits Regular rate and rhythm, no murmurs Examination of peripheral vascular system by observation and palpation is normal  EYES: Ophthalmoscopic exam of optic discs and posterior segments is normal; no papilledema or hemorrhages No results found.  MUSCULOSKELETAL: Gait, strength, tone, movements noted in Neurologic exam below  NEUROLOGIC: MENTAL STATUS:     04/26/2022    4:23 PM  MMSE - Mini Mental State Exam  Orientation to time 5  Orientation to Place 5  Registration 3  Attention/ Calculation 5  Recall 3  Language- name 2 objects 2  Language- repeat 1  Language- follow 3 step command 3  Language- read & follow direction 1  Write a sentence 1  Copy design 1  Total score 30   awake, alert, oriented to person, place and time recent and remote memory intact normal attention and concentration language fluent, comprehension intact, naming intact fund of knowledge appropriate  CRANIAL NERVE:  2nd -  no papilledema on fundoscopic exam 2nd, 3rd, 4th, 6th - pupils equal and reactive to light, visual fields full to confrontation, extraocular muscles intact, no nystagmus 5th - facial sensation symmetric 7th - facial strength symmetric 8th - hearing intact 9th - palate elevates symmetrically, uvula midline 11th - shoulder shrug symmetric 12th - tongue protrusion midline  MOTOR:  normal bulk and tone, full strength in the BUE, BLE MILD POSTURAL AND ACTION TREMOR IN BUE (LUE > RUE)  SENSORY:  normal and symmetric to light touch, temperature, vibration  COORDINATION:  finger-nose-finger, fine finger movements normal  REFLEXES:  deep tendon reflexes present and symmetric  GAIT/STATION:  narrow based gait     DIAGNOSTIC  DATA (LABS, IMAGING, TESTING) - I reviewed patient records, labs, notes, testing and imaging myself where available.  Lab Results  Component Value Date   WBC 4.3 (L) 02/18/2011   HGB 16.6 02/18/2011   HCT 47.3 02/18/2011   MCV 94.7 02/18/2011   PLT 192.0 02/18/2011      Component Value Date/Time   NA 138 12/31/2020 1006   K 4.7 12/31/2020 1006   CL 103 12/31/2020 1006   CO2 22 12/31/2020 1006   GLUCOSE 84 12/31/2020 1006   GLUCOSE 94 02/18/2011 1351   BUN 28 (H) 12/31/2020 1006   CREATININE 1.08 12/31/2020 1006   CALCIUM 9.2 12/31/2020 1006   PROT 6.9 02/18/2011 1351   ALBUMIN 4.1 02/18/2011 1351   AST 21 02/18/2011 1351   ALT 31 02/18/2011 1351   ALKPHOS 73 02/18/2011 1351   BILITOT 0.5 02/18/2011 1351   GFRNONAA >60 10/16/2007 0609   GFRAA  10/16/2007 0609    >60        The eGFR has been calculated using the MDRD equation. This calculation has not been validated in all clinical   No results found for: "CHOL", "HDL", "[place]", "LDLDIRECT", "TRIG", "CHOLHDL" No results found for: "HGBA1C" Lab Results  Component Value Date   VITAMINB12 >2000 (H) 02/14/2022   Lab Results  Component Value Date   TSH 1.790 02/14/2022    02/27/22 MRI of  the brain with and without contrast shows the following: 1.   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    2.   Few small T2/FLAIR hyperintense foci in the cerebral hemispheres and right cerebellum.  None of these enhance or appear to be acute.  They are most consistent with mild chronic microvascular ischemic changes. 3.   Normal enhancement pattern.  No acute findings.    ASSESSMENT AND PLAN  76 y.o. year old male here with:   Dx:  1. Tremor   2. Dizziness   3. Memory loss      PLAN:  ESSENTIAL TREMOR (since 2010) - has tried propranolol 78m daily; also primidone in past (side effects) - consider gabapentin; consider DBS evaluation (he is reluctant to pursue)  MEMORY LOSS (MMSE 30/30; no major changes in ADLs; however wife notes major changes; also leading to some marital strain) - refer to neuropsych testing; short term memory loss, ? confusion with driving directions; no major changes with ADLs - encouraged to start CPAP for OSA (currently only O2 only); follow up with Dr. YAnnamaria Boots Orders Placed This Encounter  Procedures   Ambulatory referral to Neuropsychology   Return for pending if symptoms worsen or fail to improve, pending test results.  I spent 71 minutes of face-to-face and non-face-to-face time with patient.  This included previsit chart review, lab review, study review, order entry, electronic health record documentation, patient education.     VPenni Bombard MD 86/01/9934 47:01PM Certified in Neurology, Neurophysiology and Neuroimaging  [facility] [address] SPrairie du RocherGRice Vernal [phone]

## 2022-04-27 ENCOUNTER — Encounter: Payer: Self-pay | Admitting: Psychology

## 2022-04-28 NOTE — Progress Notes (Signed)
Cardiology Office Note:    Date:  04/29/2022   ID:  Daniel Terrell, DOB [birth year], MRN [id number]  PCP:  Lowella Dandy, NP  Cardiologist:  Shirlee More, MD    Referring MD: Nicoletta Dress, MD    ASSESSMENT:    1. Agatston coronary artery calcium score less than 100   2. Mild CAD   3. Essential hypertension   4. Pure hypercholesterolemia    PLAN:    In order of problems listed above:  Is a very high calcium score very mild CAD nonobstructive on good medical therapy including aspirin beta-blocker and calcium channel blocker he is statin intolerant and we will add bempedoic acid to his fenofibrate goal LDL less than 70 if not tolerated I think he should be on PCSK9 inhibitor. Stable blood pressure is controlled and continue his current beta-blocker calcium channel blocker Intensify lipid-lowering therapy adding bempedoic acid to fenofibrate to achieve an LDL less than 70 with CAD and a very high calcium score   Next appointment: 1 year   Medication Adjustments/Labs and Tests Ordered: Current medicines are reviewed at length with the patient today.  Concerns regarding medicines are outlined above.  Orders Placed This Encounter  Procedures   Lipid Profile   Lipoprotein A (LPA)   EKG 12-Lead   Meds ordered this encounter  Medications   Bempedoic Acid 180 MG TABS    Sig: Take 180 mg by mouth every Monday, Wednesday, and Friday.    Dispense:  45 tablet    Refill:  3    Chief Complaint  Patient presents with   Annual Exam  For CAD  History of Present Illness:    Daniel Terrell is a 76 y.o. male with a hx of COPD, hypertension, hyperlipidemia, aortic ectasia of thoracic aorta and chest pain last seen 02/23/2021.  He underwent cardiac CTA reported 01/05/2021 showing coronary calcium score of 79-26 percentile for age and sex mildly dilated ascending aorta 41 mm a sending and minimal nonobstructive CAD 1 to 24% right coronary artery and LAD.  Screening  duplex  abdominal aorta 01/20/2021 showed no evidence of aneurysm.  Compliance with diet, lifestyle and medications: Yes  He has had no cardiovascular symptoms of chest pain edema shortness of breath palpitation or syncope He is on nonstatin therapy with fenofibrate his LDL remains quite elevated 133 and will start low-dose bempedoic acid generally well-tolerated read to check his lipids in 2 months if his LDL remains greater than 70 put him on daily bempedoic acid he will continue his fenofibrate for now Past Medical History:  Diagnosis Date   Arthritis    Asthma    Colon polyps    Complication of anesthesia    COPD (chronic obstructive pulmonary disease) (HCC)    Essential tremor    GERD (gastroesophageal reflux disease)    Hypertriglyceridemia    IBS (irritable bowel syndrome)    OCD (obsessive compulsive disorder)    Oxygen deficiency    uses O2 at night for COPD   PONV (postoperative nausea and vomiting)    Prostate cancer ([place])    Sleep apnea    Tremor     Past Surgical History:  Procedure Laterality Date   BLADDER SURGERY     extension   COLONOSCOPY  08/13/2009   FINGER SURGERY     left x 3   GREAT TOE ARTHRODESIS, INTERPHALANGEAL JOINT     KNEE ARTHROSCOPY     left   PROSTATECTOMY  TONSILLECTOMY      Current Medications: Current Meds  Medication Sig   albuterol (PROVENTIL HFA;VENTOLIN HFA) 108 (90 BASE) MCG/ACT inhaler Inhale 1-2 puffs into the lungs every 6 (six) hours as needed for wheezing or shortness of breath.   amLODipine (NORVASC) 5 MG tablet Take 5 mg by mouth daily.   Armodafinil 250 MG tablet 1/2 or 1 tab daily as needed for sleepiness   aspirin 81 MG tablet Take 81 mg by mouth daily.   Bempedoic Acid 180 MG TABS Take 180 mg by mouth every Monday, Wednesday, and Friday.   cetirizine (ZYRTEC) 10 MG tablet Take 10 mg by mouth daily.   cholecalciferol (VITAMIN D3) 25 MCG (1000 UT) tablet Take 5,000 Units by mouth daily.   Choline Fenofibrate (FENOFIBRIC  ACID) 135 MG CPDR Take 1 capsule by mouth daily.    fluticasone (FLONASE) 50 MCG/ACT nasal spray Place 2 sprays into the nose daily as needed.   Fluticasone-Umeclidin-Vilant (TRELEGY ELLIPTA) 100-62.5-25 MCG/ACT AEPB Inhale 1 puff by mouth into the lungs daily (need office visit for further refills)   glucosamine-chondroitin 500-400 MG tablet Take 1 tablet by mouth 2 (two) times daily.   ipratropium-albuterol (DUONEB) 0.5-2.5 (3) MG/3ML SOLN Take 3 mLs by nebulization.   meloxicam (MOBIC) 15 MG tablet Take 15 mg by mouth daily.   montelukast (SINGULAIR) 10 MG tablet Take 10 mg by mouth daily.   Multiple Vitamins-Minerals (MULTIVITAL PO) Take 1 tablet by mouth daily.   nitroGLYCERIN (NITROSTAT) 0.4 MG SL tablet Place 1 tablet (0.4 mg total) under the tongue every 5 (five) minutes as needed for chest pain.   omeprazole (PRILOSEC) 20 MG capsule Take 20 mg by mouth daily.   PARoxetine (PAXIL) 30 MG tablet Take 60 mg by mouth daily.   PRESCRIPTION MEDICATION Nocturnal oxygen 2 L at night. Per Dr. Keturah Barre   Probiotic Product (PROBIOTIC-10 PO) Take by mouth.   propranolol ER (INDERAL LA) 80 MG 24 hr capsule Take 1 capsule (80 mg total) by mouth daily.   Current Facility-Administered Medications for the 04/29/22 encounter (Office Visit) with Richardo Priest, MD  Medication   0.9 %  sodium chloride infusion     Allergies:   Rosuvastatin calcium, Amoxicillin, Avelox [moxifloxacin], Levofloxacin, Ofloxacin, and Topamax [topiramate]   Social History   Socioeconomic History   Marital status: Married    Spouse name: Denice Paradise   Number of children: 3   Years of education: BA   Highest education level: Not on file  Occupational History   Occupation: Theme park manager   Occupation: Retired  Tobacco Use   Smoking status: Former    Types: Cigarettes    Quit date: 09/26/1970    Years since quitting: 51.6   Smokeless tobacco: Never  Vaping Use   Vaping Use: Never used  Substance and Sexual Activity   Alcohol use:  No   Drug use: No   Sexual activity: Not on file  Other Topics Concern   Not on file  Social History Narrative   Patient is married with 3 children.   Patient is right handed.   Patient has a Bachelor's degree.   Patient drinks 2-3 cups daily.      Social Determinants of Health   Financial Resource Strain: Not on file  Food Insecurity: Not on file  Transportation Needs: Not on file  Physical Activity: Not on file  Stress: Not on file  Social Connections: Not on file     Family History: The patient's family history includes  Alcohol abuse in his father; Breast cancer in his maternal grandmother and mother; Colon cancer in his maternal grandfather; Colon polyps in his mother; Heart attack in his father; Heart disease in his father; Tremor in his mother. There is no history of Esophageal cancer, Stomach cancer, or Rectal cancer. ROS:   Please see the history of present illness.    All other systems reviewed and are negative.  EKGs/Labs/Other Studies Reviewed:    The following studies were reviewed today:  EKG:  EKG ordered today and personally reviewed.  The ekg ordered today demonstrates sinus rhythm QS pattern in leads III aVF possible preceding inferior infarction  Recent Labs: 02/14/2022: TSH 1.790  Recent Lipid Panel 04/26/2022 cholesterol 205 triglycerides 143 LDL 133 HDL 46  Physical Exam:    VS:  BP 120/84 (BP Location: Right Arm, Patient Position: Sitting, Cuff Size: Normal)   Pulse 75   Ht '5\' 9"'$  (1.753 m)   Wt 203 lb (92.1 kg)   SpO2 96%   BMI 29.98 kg/m     Wt Readings from Last 3 Encounters:  04/29/22 203 lb (92.1 kg)  04/26/22 203 lb 12.8 oz (92.4 kg)  02/25/22 200 lb (90.7 kg)     GEN:  Well nourished, well developed in no acute distress HEENT: Normal NECK: No JVD; No carotid bruits LYMPHATICS: No lymphadenopathy CARDIAC: RRR, no murmurs, rubs, gallops RESPIRATORY:  Clear to auscultation without rales, wheezing or rhonchi  ABDOMEN: Soft,  non-tender, non-distended MUSCULOSKELETAL:  No edema; No deformity  SKIN: Warm and dry NEUROLOGIC:  Alert and oriented x 3 PSYCHIATRIC:  Normal affect    Signed, Shirlee More, MD  04/29/2022 12:06 PM    [place]

## 2022-04-29 ENCOUNTER — Ambulatory Visit: Payer: PPO | Admitting: Cardiology

## 2022-04-29 ENCOUNTER — Encounter: Payer: Self-pay | Admitting: Cardiology

## 2022-04-29 VITALS — BP 120/84 | HR 75 | Ht 69.0 in | Wt 203.0 lb

## 2022-04-29 DIAGNOSIS — E7800 Pure hypercholesterolemia, unspecified: Secondary | ICD-10-CM | POA: Diagnosis not present

## 2022-04-29 DIAGNOSIS — I2510 Atherosclerotic heart disease of native coronary artery without angina pectoris: Secondary | ICD-10-CM

## 2022-04-29 DIAGNOSIS — R931 Abnormal findings on diagnostic imaging of heart and coronary circulation: Secondary | ICD-10-CM

## 2022-04-29 DIAGNOSIS — I10 Essential (primary) hypertension: Secondary | ICD-10-CM | POA: Diagnosis not present

## 2022-04-29 MED ORDER — BEMPEDOIC ACID 180 MG PO TABS
180.0000 mg | ORAL_TABLET | ORAL | 3 refills | Status: DC
  Filled 2022-08-23 – 2022-12-28 (×2): qty 30, 70d supply, fill #0

## 2022-04-29 NOTE — Patient Instructions (Signed)
Medication Instructions:  Your physician has recommended you make the following change in your medication:   START: Bempedoic acid 180 mg every M-W-F  *If you need a refill on your cardiac medications before your next appointment, please call your pharmacy*   Lab Work: Your physician recommends that you return for lab work in:   Labs in 2 months: Lipids, LPa  If you have labs (blood work) drawn today and your tests are completely normal, you will receive your results only by: [place] (if you have [place]) OR A paper copy in the mail If you have any lab test that is abnormal or we need to change your treatment, we will call you to review the results.   Testing/Procedures: None   Follow-Up: At [facility], you and your health needs are our priority.  As part of our continuing mission to provide you with exceptional heart care, we have created designated Provider Care Teams.  These Care Teams include your primary Cardiologist (physician) and Advanced Practice Providers (APPs -  Physician Assistants and Nurse Practitioners) who all work together to provide you with the care you need, when you need it.  We recommend signing up for the patient portal called "MyChart".  Sign up information is provided on this After Visit Summary.  MyChart is used to connect with patients for Virtual Visits (Telemedicine).  Patients are able to view lab/test results, encounter notes, upcoming appointments, etc.  Non-urgent messages can be sent to your provider as well.   To learn more about what you can do with MyChart, go to [email or web address].    Your next appointment:   1 year(s)  The format for your next appointment:   In Person  Provider:   Shirlee More, MD    Other Instructions None  Important Information About Sugar

## 2022-05-10 ENCOUNTER — Telehealth: Payer: Self-pay | Admitting: Cardiology

## 2022-05-10 NOTE — Telephone Encounter (Signed)
Pt c/o medication issue:  1. Name of Medication: Nexletol  2. How are you currently taking this medication (dosage and times per day)?   3. Are you having a reaction (difficulty breathing--STAT)? NO  4. What is your medication issue? Pt calling to f/u on fax that pharmacy sent over on 05/05/22 regarding Pre Approval for medication. Please advise

## 2022-05-13 ENCOUNTER — Telehealth: Payer: Self-pay

## 2022-05-13 NOTE — Telephone Encounter (Signed)
Prior authorization sent through cover my meds for [facility]    Kron [place] Key: [id number] - [place] Case ID: [id number] Need help? Call us at [phone]   Drug Nexletol '180MG'$  tablets Form [place] [employer] Electronic Prior Authorization Form 2017 NCPDP

## 2022-05-13 NOTE — Telephone Encounter (Signed)
Ester Rink Key: [id number] - PA Case ID: [id number] Call us at [phone] Status Sent to Plan today Drug Nexletol '180MG'$  tablets Form [employer] [employer] Electronic Prior Authorization Form 2017 NCPDP

## 2022-05-16 ENCOUNTER — Telehealth: Payer: Self-pay

## 2022-05-16 NOTE — Telephone Encounter (Signed)
Coverage determination notice of approval for Nexletol 180 mg tablet starting 05/13/2022  / ending  11/13/2022 Copy of letter place in patients chart / media

## 2022-05-19 ENCOUNTER — Encounter: Payer: Self-pay | Admitting: Cardiology

## 2022-05-19 NOTE — Telephone Encounter (Signed)
Samples placed for pt pickup. No pt assistance is available. No funding in [employer].

## 2022-06-01 NOTE — Progress Notes (Signed)
Subjective:    Patient ID: Daniel Terrell, male    DOB: May 03, 1946, 76 y.o.   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 -Trelegy 100, Proventil HFA, singulair, flonase,    Nuvigil 250 O2 2L sleep/ Advanced             Oral appliance for OSA stopped his snoring. 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  Stopped wearing oral appliance because he was moving his teeth. Had COVID infection in 2020.  Aware of some dyspnea on exertion now without acute event.  Has been much less active since he broke his fibula  stepping off a curb this year and blames lack of exercise.  We discussed whether steroid component and Trelegy could contribute to osteopenia significantly.  I suggested we consider changing to a LABA/LAMA without steroid, but he has felt so much better using Trelegy that he wants to discuss change with his wife first. We are going to update PFT.  Did not discuss updating a sleep study on oxygen.  06/02/22- 76 year old male former smoker followed for OSA/quit oral appliance, COPD/recurrent allergic Bronchitis-skin test positive, complicated by polycythemia/phlebotomy, HTN, Aortic Ectasia, Hypercholesterolemia, CAD,Angina,/ MI,  Covid infection 2020, Tremor, -Trelegy 100,  Proventil HFA, singulair, flonase,    Nuvigil 250 O2 2L sleep/ Advanced              -Albuterol hfa, Armodafinil 250, Trelegy 100, Neb Duoneb, Singulair,Flonase,    Note Inderal LA,  Covid vax-none ------He is doing about the same, has some dry deep cough, shortness of breath with exertion and chest tightness.  He forgot PFT today and will reschedule. Inhalers don't prevent deep rattling cough, nonproductive Has had Neurology eval for episdoes of vertigo and dizziness. Neuro recommends we update sleep study. He is now only wearing O2 for sleep and wife does comment on his snoring.Armodafinil helps with daytime tiredness.   Review of Systems- see HPI + = positive Constitutional:   No-   weight loss, night sweats, fevers, chills, +fatigue, lassitude. HEENT:   No-  headaches, difficulty swallowing, tooth/dental problems, sore throat,        sneezing, itching, ear ache,  nasal congestion, post nasal drip,  CV:  No-   chest pain, orthopnea, PND, swelling in lower extremities, anasarca, dizziness, palpitations Resp: +shortness of breath with exertion or at rest.              Per HPI;  No-  coughing up of blood.              No-   change in color of mucus.  + wheezing.   Skin: No-   rash or lesions. GI:  No-   heartburn, indigestion, abdominal pain, nausea, vomiting,  GU:  MS:  No-   joint pain or swelling.   Neuro- nothing unusual Psych:  No- change in mood or affect. No depression or anxiety.  No memory loss. Objective:    OBJ- Physical Exam General- Alert, Oriented, Affect-appropriate, Distress- none acute ,+  looks well, + overweight Skin- rash-none, lesions- none, excoriation- none Lymphadenopathy- none Head- atraumatic            Eyes- Gross vision intact, PERRLA, conjunctivae and secretions clear            Ears- Hearing, canals-normal            Nose- Clear, no-Septal dev, mucus, polyps, erosion, perforation             Throat- Mallampati II , mucosa clear , drainage- none, tonsils-  atrophic  Neck- flexible , trachea midline, no stridor , thyroid nl, carotid no bruit Chest - symmetrical excursion , unlabored           Heart/CV- RRR , no murmur , no gallop  , no rub, nl s1 s2                           - JVD- none , edema- none, stasis changes- none, varices- none           Lung- +clear, wheeze- none, cough-none , dullness-none, rub- none           Chest wall-  Abd-  Br/ Gen/ Rectal- Not done, not indicated Extrem- +brace R ankle Neuro- + mild tremor at mouth    Assessment & Plan:

## 2022-06-02 ENCOUNTER — Encounter: Payer: Self-pay | Admitting: Internal Medicine

## 2022-06-02 ENCOUNTER — Ambulatory Visit: Payer: PPO | Admitting: Internal Medicine

## 2022-06-02 ENCOUNTER — Ambulatory Visit (INDEPENDENT_AMBULATORY_CARE_PROVIDER_SITE_OTHER): Payer: PPO

## 2022-06-02 VITALS — BP 114/72 | HR 72 | Temp 98.1°F | Ht 69.0 in | Wt 204.0 lb

## 2022-06-02 DIAGNOSIS — J42 Unspecified chronic bronchitis: Secondary | ICD-10-CM

## 2022-06-02 DIAGNOSIS — G4733 Obstructive sleep apnea (adult) (pediatric): Secondary | ICD-10-CM

## 2022-06-02 MED ORDER — ARMODAFINIL 250 MG PO TABS: ORAL_TABLET | ORAL | 1 refills | Status: DC

## 2022-06-02 NOTE — Assessment & Plan Note (Signed)
He needs updated sleep study and reconsideration of treatment options, having previously tried CPAP and oral appliance Plan- schedule sleep study, refill armodafinil

## 2022-06-02 NOTE — Patient Instructions (Signed)
Order-  schedule home sleep test( without O2) or Split night sleep study (sleep center)  which ever can be done first                 Dx OSA  Order- CXR   dx chronic bronchitis  Order- reschedule PFT   dx chronic bronchitis

## 2022-06-02 NOTE — Assessment & Plan Note (Signed)
Occasional cough Plan- update CXR, reschedule PFT, continue Trelegy 100

## 2022-06-06 ENCOUNTER — Telehealth: Payer: Self-pay | Admitting: Internal Medicine

## 2022-06-06 NOTE — Telephone Encounter (Signed)
FYI: Patient called back for xray results, states that he read them on mychart- aware that lungs are clear and heart looks normal. Patient asked about sleep study- informed that we are working on approval for insurance and our office would call to schedule that once we get an approval. Patient verbalized understanding.

## 2022-06-16 ENCOUNTER — Encounter: Payer: Self-pay | Admitting: Internal Medicine

## 2022-06-17 NOTE — Telephone Encounter (Signed)
PCC's, a split night sleep study was ordered for pt on 9/7. Pt is requesting the sleep centers phone number. Is there a number patients are able to call to schedule an appointment?

## 2022-07-05 ENCOUNTER — Ambulatory Visit (HOSPITAL_BASED_OUTPATIENT_CLINIC_OR_DEPARTMENT_OTHER): Payer: PPO | Attending: Internal Medicine | Admitting: Internal Medicine

## 2022-07-05 VITALS — Ht 69.5 in | Wt 200.0 lb

## 2022-07-05 DIAGNOSIS — G4733 Obstructive sleep apnea (adult) (pediatric): Secondary | ICD-10-CM

## 2022-07-10 DIAGNOSIS — G4733 Obstructive sleep apnea (adult) (pediatric): Secondary | ICD-10-CM

## 2022-07-10 NOTE — Procedures (Signed)
Patient Name: Daniel Terrell, Daniel Terrell Date: 07/05/2022 Gender: Male D.O.B: 1946/03/08 Age (years): 28 Referring Provider: [name] MD, ABSM Height (inches): 70 Interpreting Physician: [name] MD, ABSM Weight (lbs): 200 RPSGT: Laren Everts BMI: 29 MRN: [id number] Neck Size: 15.75  CLINICAL INFORMATION Sleep Study Type: NPSG Indication for sleep study: COPD, Excessive Daytime Sleepiness, Fatigue, OSA, Snoring Epworth Sleepiness Score: 12  SLEEP STUDY TECHNIQUE As per the AASM Manual for the Scoring of Sleep and Associated Events v2.3 (April 2016) with a hypopnea requiring 4% desaturations.  The channels recorded and monitored were frontal, central and occipital EEG, electrooculogram (EOG), submentalis EMG (chin), nasal and oral airflow, thoracic and abdominal wall motion, anterior tibialis EMG, snore microphone, electrocardiogram, and pulse oximetry.  MEDICATIONS Medications self-administered by patient taken the night of the study : AMLODIPINE, ASPIRIN, CETIRIZINE, GLUCOSAMINE CHONDROITIN, MELOXICAM, MONTELUKAST, OMEPRAZOLE, PROBIOTICA  SLEEP ARCHITECTURE The study was initiated at 10:15:52 PM and ended at 4:57:11 AM.  Sleep onset time was 62.6 minutes and the sleep efficiency was 56.1%%. The total sleep time was 225.3 minutes.  Stage REM latency was N/A minutes.  The patient spent 40.5%% of the night in stage N1 sleep, 59.5%% in stage N2 sleep, 0.0%% in stage N3 and 0% in REM.  Alpha intrusion was absent.  Supine sleep was 4.33%.  RESPIRATORY PARAMETERS The overall apnea/hypopnea index (AHI) was 49.3 per hour. There were 27 total apneas, including 19 obstructive, 5 central and 3 mixed apneas. There were 158 hypopneas and 50 RERAs.  The AHI during Stage REM sleep was N/A per hour.  AHI while supine was 73.8 per hour.  The mean oxygen saturation was 91.8%. The minimum SpO2 during sleep was 85.0%.  moderate snoring was noted during this  study.  CARDIAC DATA The 2 lead EKG demonstrated sinus rhythm. The mean heart rate was 61.0 beats per minute. Other EKG findings include: PVCs.  LEG MOVEMENT DATA The total PLMS were 0 with a resulting PLMS index of 0.0. Associated arousal with leg movement index was 0.8 .  IMPRESSIONS - Severe obstructive sleep apnea occurred during this study (AHI = 49.3/h). - No significant central sleep apnea occurred during this study (CAI = 1.3/h). - Difficulty initiating sleep and marked sleepfragmentation with frequent arousal and awakening. Bathroom x 3. - Insufficient early sleep to meet protocol requirements for Split CPAP titration. - Mild oxygen desaturation was noted during this study (Min O2 = 85.0%). Mean saturation 91.8%. Supplemental O2 not applied. Time with O2 saturation 88% or less was 11.4 minutes. - The patient snored with moderate snoring volume. - EKG findings included occasional PVCs. - Clinically significant periodic limb movements did not occur during sleep. No significant associated arousals.  DIAGNOSIS - Obstructive Sleep Apnea (G47.33)  RECOMMENDATIONS - Suggest CPAP titration sleep study or autopap. Other options would be based on clinical judgment. - Be careful with alcohol, sedatives and other CNS depressants that may worsen sleep apnea and disrupt normal sleep architecture. - Sleep hygiene should be reviewed to assess factors that may improve sleep quality. - Weight management and regular exercise should be initiated or continued if appropriate.  [Electronically signed] 07/10/2022 12:06 PM  [name] MD, [place], American Board of Sleep Medicine NPI: [id number]                      [place], [place] [facility]  ELECTRONICALLY SIGNED ON:  07/10/2022, 12:00 PM [place] PH: [phone]   FX: [phone] ACCREDITED BY THE AMERICAN ACADEMY OF SLEEP MEDICINE

## 2022-07-11 ENCOUNTER — Telehealth: Payer: Self-pay | Admitting: Internal Medicine

## 2022-07-11 ENCOUNTER — Ambulatory Visit (INDEPENDENT_AMBULATORY_CARE_PROVIDER_SITE_OTHER): Payer: PPO | Admitting: Internal Medicine

## 2022-07-11 DIAGNOSIS — R0609 Other forms of dyspnea: Secondary | ICD-10-CM | POA: Diagnosis not present

## 2022-07-11 DIAGNOSIS — G4733 Obstructive sleep apnea (adult) (pediatric): Secondary | ICD-10-CM

## 2022-07-11 DIAGNOSIS — J449 Chronic obstructive pulmonary disease, unspecified: Secondary | ICD-10-CM

## 2022-07-11 LAB — PULMONARY FUNCTION TEST
DL/VA % pred: 94 %
DL/VA: 3.77 ml/min/mmHg/L
DLCO cor % pred: 93 %
DLCO cor: 23.52 ml/min/mmHg
DLCO unc % pred: 93 %
DLCO unc: 23.52 ml/min/mmHg
FEF 25-75 Post: 3.58 L/sec
FEF 25-75 Pre: 2.97 L/sec
FEF2575-%Change-Post: 20 %
FEF2575-%Pred-Post: 157 %
FEF2575-%Pred-Pre: 130 %
FEV1-%Change-Post: 6 %
FEV1-%Pred-Post: 108 %
FEV1-%Pred-Pre: 101 %
FEV1-Post: 3.36 L
FEV1-Pre: 3.16 L
FEV1FVC-%Change-Post: 0 %
FEV1FVC-%Pred-Pre: 107 %
FEV6-%Change-Post: 5 %
FEV6-%Pred-Post: 105 %
FEV6-%Pred-Pre: 99 %
FEV6-Post: 4.23 L
FEV6-Pre: 4.01 L
FEV6FVC-%Change-Post: 0 %
FEV6FVC-%Pred-Post: 106 %
FEV6FVC-%Pred-Pre: 106 %
FVC-%Change-Post: 5 %
FVC-%Pred-Post: 99 %
FVC-%Pred-Pre: 94 %
FVC-Post: 4.25 L
FVC-Pre: 4.03 L
Post FEV1/FVC ratio: 79 %
Post FEV6/FVC ratio: 100 %
Pre FEV1/FVC ratio: 78 %
Pre FEV6/FVC Ratio: 100 %
RV % pred: 97 %
RV: 2.47 L
TLC % pred: 96 %
TLC: 6.77 L

## 2022-07-11 MED ORDER — TRELEGY ELLIPTA 100-62.5-25 MCG/ACT IN AEPB: 28.0000 | INHALATION_SPRAY | Freq: Every day | RESPIRATORY_TRACT | 0 refills | Status: DC

## 2022-07-11 NOTE — Telephone Encounter (Signed)
Documentation- Sleep study showed severe obstructive sleep apnea averaging 49 apneas/ hour with drops in blood oxygen level.  Order- DME [facility]- new CPAP auto 5-20, mask of choice, humidifier, supplies, AirView/ card. Bleed in current O2 for sleep at 2L/ min.  He can keep currently scheduled appt.in January.

## 2022-07-11 NOTE — Telephone Encounter (Signed)
Called and spoke with pt letting him know the results of the HST and recs per CY and he verbalized understanding. Order placed for the cpap start. Nothing further needed.

## 2022-07-11 NOTE — Progress Notes (Signed)
Full PFT Performed Today  

## 2022-07-11 NOTE — Patient Instructions (Signed)
Full PFT Performed Today  

## 2022-07-11 NOTE — Addendum Note (Signed)
Addended by: Irine Seal B on: 07/11/2022 11:28 AM   Modules accepted: Orders

## 2022-07-13 NOTE — Progress Notes (Signed)
Spoke with pt and notified of results per Dr. Young Pt verbalized understanding and denied any questions. 

## 2022-07-22 DIAGNOSIS — S62604A Fracture of unspecified phalanx of right ring finger, initial encounter for closed fracture: Secondary | ICD-10-CM | POA: Diagnosis not present

## 2022-07-25 DIAGNOSIS — M25641 Stiffness of right hand, not elsewhere classified: Secondary | ICD-10-CM | POA: Diagnosis not present

## 2022-07-25 DIAGNOSIS — S62324A Displaced fracture of shaft of fourth metacarpal bone, right hand, initial encounter for closed fracture: Secondary | ICD-10-CM | POA: Diagnosis not present

## 2022-07-25 DIAGNOSIS — S62329A Displaced fracture of shaft of unspecified metacarpal bone, initial encounter for closed fracture: Secondary | ICD-10-CM

## 2022-07-25 DIAGNOSIS — M79641 Pain in right hand: Secondary | ICD-10-CM | POA: Diagnosis not present

## 2022-07-25 HISTORY — DX: Pain in right hand: M79.641

## 2022-07-25 HISTORY — DX: Displaced fracture of shaft of unspecified metacarpal bone, initial encounter for closed fracture: S62.329A

## 2022-07-27 ENCOUNTER — Encounter (HOSPITAL_BASED_OUTPATIENT_CLINIC_OR_DEPARTMENT_OTHER): Payer: PPO | Admitting: Internal Medicine

## 2022-08-01 DIAGNOSIS — G4733 Obstructive sleep apnea (adult) (pediatric): Secondary | ICD-10-CM | POA: Diagnosis not present

## 2022-08-02 ENCOUNTER — Telehealth: Payer: Self-pay | Admitting: Internal Medicine

## 2022-08-02 DIAGNOSIS — G4733 Obstructive sleep apnea (adult) (pediatric): Secondary | ICD-10-CM

## 2022-08-02 DIAGNOSIS — S62324A Displaced fracture of shaft of fourth metacarpal bone, right hand, initial encounter for closed fracture: Secondary | ICD-10-CM | POA: Diagnosis not present

## 2022-08-02 DIAGNOSIS — M79641 Pain in right hand: Secondary | ICD-10-CM | POA: Diagnosis not present

## 2022-08-02 NOTE — Telephone Encounter (Signed)
Called and spoke to patient and let him know that Dr Annamaria Boots is adjusting his setting. Placing order now for settings change. He verbalized understanding. Nothing further needed

## 2022-08-02 NOTE — Telephone Encounter (Signed)
Called patient and he states that he cant seem to get a tight seal with his face mask due to the pressure.  Patient states that he has to have the full face mask. He is wanting to know if his setting can be changed?  Please advise sir

## 2022-08-02 NOTE — Telephone Encounter (Signed)
Order- DME [facility]- please change autopap range to 4-15

## 2022-08-05 ENCOUNTER — Telehealth: Payer: Self-pay | Admitting: Internal Medicine

## 2022-08-05 NOTE — Telephone Encounter (Signed)
Call made to [facility], per [facility] the order has not been received. Made aware per epic it was received per [facility]. She states she does not see a order scanned in.   Will route to pcc team to inquire.   [facility] please advise. Thanks

## 2022-08-05 NOTE — Telephone Encounter (Signed)
Order just placed on 11/7, sent to [facility] on 11/8 & confirmation received from [facility] yesterday.  I called Brad & left vm for him to call me back to verify if he has order.

## 2022-08-09 NOTE — Telephone Encounter (Signed)
Received message from [place] they do have the order and has been sent to process.  I spoke to pt & made him aware.  Nothing further needed.

## 2022-08-09 NOTE — Telephone Encounter (Signed)
Left Brad another vm to check status.

## 2022-08-10 ENCOUNTER — Other Ambulatory Visit (HOSPITAL_COMMUNITY): Payer: Self-pay

## 2022-08-15 DIAGNOSIS — I77811 Abdominal aortic ectasia: Secondary | ICD-10-CM | POA: Diagnosis not present

## 2022-08-15 DIAGNOSIS — E559 Vitamin D deficiency, unspecified: Secondary | ICD-10-CM | POA: Diagnosis not present

## 2022-08-15 DIAGNOSIS — D582 Other hemoglobinopathies: Secondary | ICD-10-CM | POA: Diagnosis not present

## 2022-08-15 DIAGNOSIS — M791 Myalgia, unspecified site: Secondary | ICD-10-CM | POA: Diagnosis not present

## 2022-08-15 DIAGNOSIS — G250 Essential tremor: Secondary | ICD-10-CM | POA: Diagnosis not present

## 2022-08-15 DIAGNOSIS — F330 Major depressive disorder, recurrent, mild: Secondary | ICD-10-CM | POA: Diagnosis not present

## 2022-08-15 DIAGNOSIS — E785 Hyperlipidemia, unspecified: Secondary | ICD-10-CM | POA: Diagnosis not present

## 2022-08-15 DIAGNOSIS — J449 Chronic obstructive pulmonary disease, unspecified: Secondary | ICD-10-CM | POA: Diagnosis not present

## 2022-08-15 DIAGNOSIS — I10 Essential (primary) hypertension: Secondary | ICD-10-CM | POA: Diagnosis not present

## 2022-08-23 ENCOUNTER — Other Ambulatory Visit (HOSPITAL_COMMUNITY): Payer: Self-pay

## 2022-08-23 MED ORDER — MELOXICAM 15 MG PO TABS
15.0000 mg | ORAL_TABLET | Freq: Every day | ORAL | 3 refills | Status: DC
  Filled 2022-08-23 – 2022-10-03 (×2): qty 90, 90d supply, fill #0
  Filled 2022-12-28: qty 90, 90d supply, fill #1

## 2022-08-23 MED ORDER — FLUTICASONE PROPIONATE 50 MCG/ACT NA SUSP
NASAL | 0 refills | Status: DC
  Filled 2022-08-23 – 2022-10-03 (×2): qty 48, 90d supply, fill #0

## 2022-08-24 ENCOUNTER — Other Ambulatory Visit (HOSPITAL_COMMUNITY): Payer: Self-pay

## 2022-08-24 MED ORDER — PAROXETINE HCL 30 MG PO TABS
60.0000 mg | ORAL_TABLET | Freq: Every day | ORAL | 3 refills | Status: DC
  Filled 2022-08-24 – 2022-10-03 (×2): qty 180, 90d supply, fill #0
  Filled 2023-03-04: qty 180, 90d supply, fill #1

## 2022-08-25 ENCOUNTER — Other Ambulatory Visit (HOSPITAL_COMMUNITY): Payer: Self-pay

## 2022-08-26 ENCOUNTER — Other Ambulatory Visit (HOSPITAL_COMMUNITY): Payer: Self-pay

## 2022-08-26 ENCOUNTER — Telehealth: Payer: Self-pay | Admitting: Internal Medicine

## 2022-08-29 ENCOUNTER — Other Ambulatory Visit (HOSPITAL_COMMUNITY): Payer: Self-pay

## 2022-08-29 NOTE — Telephone Encounter (Signed)
ATC patient. LVMTCB. 

## 2022-08-30 DIAGNOSIS — M79641 Pain in right hand: Secondary | ICD-10-CM | POA: Diagnosis not present

## 2022-08-30 DIAGNOSIS — S62324A Displaced fracture of shaft of fourth metacarpal bone, right hand, initial encounter for closed fracture: Secondary | ICD-10-CM | POA: Diagnosis not present

## 2022-08-30 MED ORDER — TRELEGY ELLIPTA 100-62.5-25 MCG/ACT IN AEPB: 1.0000 | INHALATION_SPRAY | Freq: Every day | RESPIRATORY_TRACT | 0 refills | Status: DC

## 2022-08-30 NOTE — Telephone Encounter (Signed)
2 samples up front to get pt through until out of the donut hole  Pt will pick up today  Nothing further needed

## 2022-08-31 DIAGNOSIS — G4733 Obstructive sleep apnea (adult) (pediatric): Secondary | ICD-10-CM | POA: Diagnosis not present

## 2022-09-20 ENCOUNTER — Other Ambulatory Visit (HOSPITAL_COMMUNITY): Payer: Self-pay

## 2022-09-27 DIAGNOSIS — M79641 Pain in right hand: Secondary | ICD-10-CM | POA: Diagnosis not present

## 2022-09-27 DIAGNOSIS — S62324A Displaced fracture of shaft of fourth metacarpal bone, right hand, initial encounter for closed fracture: Secondary | ICD-10-CM | POA: Diagnosis not present

## 2022-09-30 ENCOUNTER — Other Ambulatory Visit (HOSPITAL_COMMUNITY): Payer: Self-pay

## 2022-09-30 MED ORDER — TRILIPIX 135 MG PO CPDR
135.0000 mg | DELAYED_RELEASE_CAPSULE | Freq: Every day | ORAL | 3 refills | Status: DC
  Filled 2022-09-30 – 2022-10-03 (×2): qty 90, 90d supply, fill #0
  Filled 2022-12-28: qty 90, 90d supply, fill #1
  Filled 2023-04-10: qty 90, 90d supply, fill #2
  Filled 2023-07-11: qty 90, 90d supply, fill #3

## 2022-09-30 MED ORDER — MONTELUKAST SODIUM 10 MG PO TABS
10.0000 mg | ORAL_TABLET | Freq: Every day | ORAL | 3 refills | Status: DC
  Filled 2022-09-30 – 2022-10-03 (×2): qty 90, 90d supply, fill #0
  Filled 2022-12-28: qty 90, 90d supply, fill #1
  Filled 2023-04-10: qty 90, 90d supply, fill #2
  Filled 2023-08-03: qty 90, 90d supply, fill #3

## 2022-09-30 MED ORDER — AMLODIPINE BESYLATE 5 MG PO TABS
5.0000 mg | ORAL_TABLET | Freq: Every day | ORAL | 0 refills | Status: DC
  Filled 2022-09-30 – 2022-10-03 (×2): qty 90, 90d supply, fill #0

## 2022-09-30 MED ORDER — OMEPRAZOLE 20 MG PO CPDR
20.0000 mg | DELAYED_RELEASE_CAPSULE | Freq: Every day | ORAL | 4 refills | Status: DC
  Filled 2022-09-30 – 2022-10-03 (×2): qty 90, 90d supply, fill #0
  Filled 2022-12-28: qty 90, 90d supply, fill #1
  Filled 2023-04-10: qty 90, 90d supply, fill #2
  Filled 2023-07-11: qty 90, 90d supply, fill #3

## 2022-09-30 MED ORDER — PROPRANOLOL HCL ER 80 MG PO CP24
80.0000 mg | ORAL_CAPSULE | Freq: Every day | ORAL | 4 refills | Status: DC
  Filled 2022-09-30 – 2022-10-03 (×2): qty 90, 90d supply, fill #0
  Filled 2022-12-28: qty 90, 90d supply, fill #1
  Filled 2023-04-10: qty 90, 90d supply, fill #2
  Filled 2023-07-11: qty 90, 90d supply, fill #3

## 2022-10-01 ENCOUNTER — Other Ambulatory Visit (HOSPITAL_COMMUNITY): Payer: Self-pay

## 2022-10-01 DIAGNOSIS — G4733 Obstructive sleep apnea (adult) (pediatric): Secondary | ICD-10-CM | POA: Diagnosis not present

## 2022-10-01 NOTE — Progress Notes (Unsigned)
Subjective:    Patient ID: Daniel Terrell, male    DOB: Aug 15, 1946, 77 y.o.   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, complicated by polycythemia/phlebotomy NPSG 01/12/03 AHI 42/ hr, loud snore, weight 200 lbs PFT- WNL 12/30/08 NPSG 07/06/22- AHI 49.3/ hr, desaturation to 85%, body weight 200 lbs PFT 07/11/22- WNL ------------------------------------------------------------------------------------------   06/02/22- 77 year old male former smoker followed for OSA/quit oral appliance, COPD/recurrent allergic Bronchitis-skin test positive, complicated by polycythemia/phlebotomy, HTN, Aortic Ectasia, Hypercholesterolemia, CAD,Angina,/ MI,  Covid infection 2020, Tremor, -Trelegy 100, Proventil HFA, singulair, flonase,    Nuvigil 250 O2 2L sleep/ Advanced              -Albuterol hfa, Armodafinil 250, Trelegy 100, Neb Duoneb, Singulair,Flonase,    Note Inderal LA,  Covid vax-none ------He is doing about the same, has some dry deep cough, shortness of breath with exertion and chest tightness.  He forgot PFT today and will reschedule. Inhalers don't prevent deep rattling cough, nonproductive Has had Neurology eval for episdoes of vertigo and dizziness. Neuro recommends we update sleep study. He is now only wearing O2 for sleep and wife does comment on his snoring.Armodafinil helps with daytime tiredness.   10/03/22- 77 year old male former smoker followed for OSA/quit oral appliance, COPD/recurrent allergic Bronchitis-skin test positive, complicated by polycythemia/phlebotomy, HTN, Aortic Ectasia, Hypercholesterolemia, CAD,Angina,/ MI,  Covid infection 2020, Tremor, -Trelegy 100, Proventil HFA, singulair, flonase,    Nuvigil 250 NPSG 07/06/22- AHI 49.3/ hr, desaturation to 85%, body weight 200 lbs O2 2L sleep/ [facility]  ??? CPAP auto 4-15/ [facility]       Ordered 08/02/22     -Albuterol hfa, Armodafinil 250, Trelegy 100,  Neb Duoneb, Singulair,Flonase,    Note Inderal LA,  Covid vax-none Flu vax-  CXR 06/04/22- IMPRESSION: No acute disease. PFT 07/11/22- WML  Review of Systems- see HPI + = positive Constitutional:   No-   weight loss, night sweats, fevers, chills, +fatigue, lassitude. HEENT:   No-  headaches, difficulty swallowing, tooth/dental problems, sore throat,        sneezing, itching, ear ache,  nasal congestion, post nasal drip,  CV:  No-   chest pain, orthopnea, PND, swelling in lower extremities, anasarca, dizziness, palpitations Resp: +shortness of breath with exertion or at rest.              Per HPI;  No-  coughing up of blood.              No-   change in color of mucus.  + wheezing.   Skin: No-   rash or lesions. GI:  No-   heartburn, indigestion, abdominal pain, nausea, vomiting,  GU:  MS:  No-   joint pain or swelling.   Neuro- nothing unusual Psych:  No- change in mood or affect. No depression or anxiety.  No memory loss. Objective:    OBJ- Physical Exam General- Alert, Oriented, Affect-appropriate, Distress- none acute ,+ looks well, + overweight Skin- rash-none, lesions- none, excoriation- none Lymphadenopathy- none Head- atraumatic            Eyes- Gross vision intact, PERRLA, conjunctivae and secretions clear            Ears- Hearing, canals-normal            Nose- Clear, no-Septal dev, mucus, polyps, erosion, perforation             Throat- Mallampati II , mucosa clear , drainage- none,  tonsils- atrophic  Neck- flexible , trachea midline, no stridor , thyroid nl, carotid no bruit Chest - symmetrical excursion , unlabored           Heart/CV- RRR , no murmur , no gallop  , no rub, nl s1 s2                           - JVD- none , edema- none, stasis changes- none, varices- none           Lung- +clear, wheeze- none, cough-none , dullness-none, rub- none           Chest wall-  Abd-  Br/ Gen/ Rectal- Not done, not indicated Extrem- +brace R ankle Neuro- + mild tremor at  mouth    Assessment & Plan:

## 2022-10-03 ENCOUNTER — Other Ambulatory Visit: Payer: Self-pay

## 2022-10-03 ENCOUNTER — Encounter: Payer: Self-pay | Admitting: Internal Medicine

## 2022-10-03 ENCOUNTER — Ambulatory Visit: Payer: PPO | Admitting: Internal Medicine

## 2022-10-03 ENCOUNTER — Other Ambulatory Visit (HOSPITAL_COMMUNITY): Payer: Self-pay

## 2022-10-03 VITALS — BP 116/70 | HR 74 | Ht 70.0 in | Wt 207.2 lb

## 2022-10-03 DIAGNOSIS — G4733 Obstructive sleep apnea (adult) (pediatric): Secondary | ICD-10-CM

## 2022-10-03 DIAGNOSIS — G250 Essential tremor: Secondary | ICD-10-CM | POA: Diagnosis not present

## 2022-10-03 DIAGNOSIS — G4734 Idiopathic sleep related nonobstructive alveolar hypoventilation: Secondary | ICD-10-CM

## 2022-10-03 HISTORY — DX: Idiopathic sleep related nonobstructive alveolar hypoventilation: G47.34

## 2022-10-03 NOTE — Assessment & Plan Note (Signed)
He tells me it is not Parkinson's and that propranalol helps

## 2022-10-03 NOTE — Patient Instructions (Signed)
Order- DME [facility]- please let patient try nasal pillows and chin strap. Continue auto 4-15  Please call if we can help

## 2022-10-03 NOTE — Assessment & Plan Note (Addendum)
Benefits from CPAP with O2 bleed-in Plan- continue auto 4-15. Try nasal pillows with chin strap for better seal

## 2022-10-03 NOTE — Assessment & Plan Note (Signed)
Benefits from O2 through CPAP

## 2022-10-04 ENCOUNTER — Other Ambulatory Visit: Payer: Self-pay

## 2022-10-04 ENCOUNTER — Other Ambulatory Visit (HOSPITAL_COMMUNITY): Payer: Self-pay

## 2022-10-04 MED ORDER — TRELEGY ELLIPTA 100-62.5-25 MCG/ACT IN AEPB
28.0000 | INHALATION_SPRAY | Freq: Every day | RESPIRATORY_TRACT | 3 refills | Status: DC
  Filled 2022-10-04: qty 180, 90d supply, fill #0

## 2022-10-04 MED ORDER — TRELEGY ELLIPTA 100-62.5-25 MCG/ACT IN AEPB
1.0000 | INHALATION_SPRAY | Freq: Every day | RESPIRATORY_TRACT | 3 refills | Status: DC
  Filled 2022-10-04: qty 60, 30d supply, fill #0

## 2022-10-05 ENCOUNTER — Other Ambulatory Visit (HOSPITAL_COMMUNITY): Payer: Self-pay

## 2022-10-05 ENCOUNTER — Encounter (HOSPITAL_COMMUNITY): Payer: Self-pay

## 2022-10-05 ENCOUNTER — Other Ambulatory Visit: Payer: Self-pay

## 2022-10-06 ENCOUNTER — Other Ambulatory Visit (HOSPITAL_COMMUNITY): Payer: Self-pay

## 2022-10-06 MED ORDER — TRELEGY ELLIPTA 100-62.5-25 MCG/ACT IN AEPB
1.0000 | INHALATION_SPRAY | Freq: Every day | RESPIRATORY_TRACT | 3 refills | Status: DC
  Filled 2022-10-06: qty 180, 90d supply, fill #0

## 2022-10-06 NOTE — Addendum Note (Signed)
Addended by: Lorretta Harp on: 10/06/2022 12:16 PM   Modules accepted: Orders

## 2022-10-25 DIAGNOSIS — G4733 Obstructive sleep apnea (adult) (pediatric): Secondary | ICD-10-CM | POA: Diagnosis not present

## 2022-10-28 DIAGNOSIS — S62324A Displaced fracture of shaft of fourth metacarpal bone, right hand, initial encounter for closed fracture: Secondary | ICD-10-CM | POA: Diagnosis not present

## 2022-10-31 ENCOUNTER — Encounter: Payer: Self-pay | Admitting: Internal Medicine

## 2022-10-31 MED ORDER — BREZTRI AEROSPHERE 160-9-4.8 MCG/ACT IN AERO: 2.0000 | INHALATION_SPRAY | Freq: Two times a day (BID) | RESPIRATORY_TRACT | 0 refills | Status: DC

## 2022-10-31 MED ORDER — BEVESPI AEROSPHERE 9-4.8 MCG/ACT IN AERO
2.0000 | INHALATION_SPRAY | Freq: Two times a day (BID) | RESPIRATORY_TRACT | 12 refills | Status: DC
  Filled 2022-10-31: qty 10.7, 30d supply, fill #0

## 2022-10-31 NOTE — Telephone Encounter (Signed)
Please send Rx for Bevespi  # 1, ref x 12   inhale 2 puffs twice daily.  No steroid. Use this as your maintenance inhaler.

## 2022-10-31 NOTE — Telephone Encounter (Signed)
Dr. Annamaria Boots, pleased see mychart messages sent by pt and advise.  We did put the Breztri samples up front for pt which he originally said he would come pick up tomorrow 2/6 but now has sent these new messages.

## 2022-10-31 NOTE — Telephone Encounter (Signed)
Mychart message sent by pt: Daniel Terrell "[name] (supporting Deneise Lever, MD)6 minutes ago (10:59 AM)    Dr Annamaria Boots, Before the prescription for Trelegy is renewed  I would like to try the newer medication you mentioned in a former visit for my COPD. If you have a 30 or 15 day sample I can pick it up at your office. If not call in a 30 day supply at the [facility] pharmacy at [facility]. Thanks [name].      Routing to Dr. Annamaria Boots for review.

## 2022-10-31 NOTE — Telephone Encounter (Signed)
We can give him 2 samples of Breztri to try as an alternative to Trelegy, for comparison.     Inhale 2 puffs then rinse mouth, twice daily

## 2022-11-01 ENCOUNTER — Other Ambulatory Visit: Payer: Self-pay | Admitting: Internal Medicine

## 2022-11-01 ENCOUNTER — Other Ambulatory Visit (HOSPITAL_COMMUNITY): Payer: Self-pay

## 2022-11-01 ENCOUNTER — Encounter: Payer: Self-pay | Admitting: Internal Medicine

## 2022-11-01 ENCOUNTER — Other Ambulatory Visit: Payer: Self-pay

## 2022-11-01 DIAGNOSIS — G4733 Obstructive sleep apnea (adult) (pediatric): Secondary | ICD-10-CM | POA: Diagnosis not present

## 2022-11-01 MED ORDER — BEVESPI AEROSPHERE 9-4.8 MCG/ACT IN AERO
2.0000 | INHALATION_SPRAY | Freq: Two times a day (BID) | RESPIRATORY_TRACT | 12 refills | Status: DC
  Filled 2022-11-01: qty 10.7, 30d supply, fill #0

## 2022-11-02 ENCOUNTER — Other Ambulatory Visit (HOSPITAL_COMMUNITY): Payer: Self-pay

## 2022-11-02 ENCOUNTER — Other Ambulatory Visit: Payer: Self-pay

## 2022-11-02 MED ORDER — ANORO ELLIPTA 62.5-25 MCG/ACT IN AEPB
1.0000 | INHALATION_SPRAY | Freq: Every day | RESPIRATORY_TRACT | 11 refills | Status: DC
  Filled 2022-11-02: qty 60, 30d supply, fill #0

## 2022-11-02 NOTE — Telephone Encounter (Signed)
Mychart message sent by pt: Daniel Terrell "[name] (supporting Deneise Lever, MD)19 hours ago (3:19 PM)    This medication is not covered by our insurance.  Is there another drug that will work That doesn't have a steroid in it?      Routing this to Dr. Annamaria Boots for review. Please advise if you want [place] to check with our prior auth team to see which non-steroid inhalers might be covered.

## 2022-11-02 NOTE — Telephone Encounter (Signed)
Try replacing the Bevespi with Anoro, inhale 1 puff once daily, refill prn

## 2022-11-18 DIAGNOSIS — G4733 Obstructive sleep apnea (adult) (pediatric): Secondary | ICD-10-CM | POA: Diagnosis not present

## 2022-11-21 ENCOUNTER — Other Ambulatory Visit (HOSPITAL_COMMUNITY): Payer: Self-pay

## 2022-11-21 ENCOUNTER — Telehealth: Payer: Self-pay | Admitting: Internal Medicine

## 2022-11-21 MED ORDER — ANORO ELLIPTA 62.5-25 MCG/ACT IN AEPB
1.0000 | INHALATION_SPRAY | Freq: Every day | RESPIRATORY_TRACT | 11 refills | Status: DC
  Filled 2022-11-21: qty 60, 60d supply, fill #0
  Filled 2022-11-28: qty 60, 30d supply, fill #0

## 2022-11-21 NOTE — Telephone Encounter (Signed)
Called and spoke to patient and went over him needing refills on Anoro. Verified pharmacy with him. Nothing further needed

## 2022-11-21 NOTE — Telephone Encounter (Signed)
Patient states needs refill for Anoro inhaler. Needs 3 month supply. Pharmacy is [facility]. Patient phone number is [phone].

## 2022-11-28 ENCOUNTER — Other Ambulatory Visit: Payer: Self-pay

## 2022-11-30 DIAGNOSIS — G4733 Obstructive sleep apnea (adult) (pediatric): Secondary | ICD-10-CM | POA: Diagnosis not present

## 2022-12-06 ENCOUNTER — Telehealth: Payer: Self-pay | Admitting: Internal Medicine

## 2022-12-06 NOTE — Telephone Encounter (Signed)
Patient would like 3 month supply of Anoro. Was only called in for 1 month supply. Pharmacy is [facility]. Patient phone number is [phone].

## 2022-12-06 NOTE — Telephone Encounter (Signed)
Left message for patient to call back  

## 2022-12-07 ENCOUNTER — Encounter: Payer: Self-pay | Admitting: Internal Medicine

## 2022-12-07 ENCOUNTER — Other Ambulatory Visit (HOSPITAL_COMMUNITY): Payer: Self-pay

## 2022-12-07 MED ORDER — ANORO ELLIPTA 62.5-25 MCG/ACT IN AEPB
1.0000 | INHALATION_SPRAY | Freq: Every day | RESPIRATORY_TRACT | 3 refills | Status: DC
  Filled 2022-12-07: qty 120, 120d supply, fill #0
  Filled 2022-12-07 – 2022-12-21 (×2): qty 180, 90d supply, fill #0

## 2022-12-07 NOTE — Telephone Encounter (Signed)
Patient is returning phone call. Patient phone number is [phone].

## 2022-12-07 NOTE — Telephone Encounter (Signed)
Spoke to pt and he states he needed a 3 months supply of Anoro due to insurance purposes so  I refilled Anoro for 90 days with 3 refills. Pt verbalized understanding nothing further needed.

## 2022-12-15 DIAGNOSIS — G250 Essential tremor: Secondary | ICD-10-CM | POA: Diagnosis not present

## 2022-12-15 DIAGNOSIS — E785 Hyperlipidemia, unspecified: Secondary | ICD-10-CM | POA: Diagnosis not present

## 2022-12-15 DIAGNOSIS — I77811 Abdominal aortic ectasia: Secondary | ICD-10-CM | POA: Diagnosis not present

## 2022-12-15 DIAGNOSIS — J449 Chronic obstructive pulmonary disease, unspecified: Secondary | ICD-10-CM | POA: Diagnosis not present

## 2022-12-15 DIAGNOSIS — Z139 Encounter for screening, unspecified: Secondary | ICD-10-CM | POA: Diagnosis not present

## 2022-12-15 DIAGNOSIS — M791 Myalgia, unspecified site: Secondary | ICD-10-CM | POA: Diagnosis not present

## 2022-12-15 DIAGNOSIS — I10 Essential (primary) hypertension: Secondary | ICD-10-CM | POA: Diagnosis not present

## 2022-12-15 DIAGNOSIS — D582 Other hemoglobinopathies: Secondary | ICD-10-CM | POA: Diagnosis not present

## 2022-12-21 ENCOUNTER — Other Ambulatory Visit (HOSPITAL_COMMUNITY): Payer: Self-pay

## 2022-12-21 ENCOUNTER — Other Ambulatory Visit: Payer: Self-pay

## 2022-12-28 ENCOUNTER — Other Ambulatory Visit (HOSPITAL_COMMUNITY): Payer: Self-pay

## 2022-12-28 ENCOUNTER — Other Ambulatory Visit: Payer: Self-pay

## 2022-12-29 ENCOUNTER — Other Ambulatory Visit (HOSPITAL_COMMUNITY): Payer: Self-pay

## 2022-12-29 ENCOUNTER — Other Ambulatory Visit: Payer: Self-pay

## 2022-12-29 ENCOUNTER — Telehealth: Payer: Self-pay

## 2022-12-29 MED ORDER — AMLODIPINE BESYLATE 5 MG PO TABS
5.0000 mg | ORAL_TABLET | Freq: Every day | ORAL | 1 refills | Status: DC
  Filled 2022-12-29: qty 90, 90d supply, fill #0
  Filled 2023-04-10: qty 90, 90d supply, fill #1

## 2022-12-29 NOTE — Telephone Encounter (Signed)
Prior authorization requested for patients Nexletol 180 mg

## 2022-12-30 ENCOUNTER — Other Ambulatory Visit (HOSPITAL_COMMUNITY): Payer: Self-pay

## 2022-12-30 ENCOUNTER — Telehealth: Payer: Self-pay

## 2022-12-30 NOTE — Telephone Encounter (Signed)
Pharmacy Patient Advocate Encounter   Received notification from [employer] that prior authorization for NEXLETOL 180 MG TAB is needed.    PA submitted on 12/30/22 Faxed to [facility] @[phone]  Status is pending  Haze Rushing, CPhT Pharmacy Patient Advocate Specialist Direct Number: [phone] Fax: [phone]

## 2022-12-30 NOTE — Telephone Encounter (Signed)
Pharmacy Patient Advocate Encounter  Prior Authorization for NEXLETOL has been approved.    Effective dates: 12/30/22 through 12/30/23  Haze Rushing, CPhT Pharmacy Patient Advocate Specialist Direct Number: [phone] Fax: [phone]

## 2022-12-31 DIAGNOSIS — G4733 Obstructive sleep apnea (adult) (pediatric): Secondary | ICD-10-CM | POA: Diagnosis not present

## 2023-01-06 ENCOUNTER — Ambulatory Visit (INDEPENDENT_AMBULATORY_CARE_PROVIDER_SITE_OTHER): Payer: PPO | Admitting: Psychology

## 2023-01-06 ENCOUNTER — Ambulatory Visit: Payer: PPO | Admitting: Psychology

## 2023-01-06 ENCOUNTER — Encounter: Payer: Self-pay | Admitting: Psychology

## 2023-01-06 DIAGNOSIS — F09 Unspecified mental disorder due to known physiological condition: Secondary | ICD-10-CM | POA: Diagnosis not present

## 2023-01-06 DIAGNOSIS — G250 Essential tremor: Secondary | ICD-10-CM | POA: Diagnosis not present

## 2023-01-06 DIAGNOSIS — I6381 Other cerebral infarction due to occlusion or stenosis of small artery: Secondary | ICD-10-CM

## 2023-01-06 DIAGNOSIS — I6781 Acute cerebrovascular insufficiency: Secondary | ICD-10-CM | POA: Diagnosis not present

## 2023-01-06 DIAGNOSIS — R4189 Other symptoms and signs involving cognitive functions and awareness: Secondary | ICD-10-CM

## 2023-01-06 NOTE — Progress Notes (Signed)
   Psychometrician Note   Cognitive testing was administered to Daniel Terrell by Wallace Keller, B.S. (psychometrist) under the supervision of Dr. Newman Nickels, Ph.D., licensed psychologist on 01/06/2023. Mr. Kush did not appear overtly distressed by the testing session per behavioral observation or responses across self-report questionnaires. Rest breaks were offered.    The battery of tests administered was selected by Dr. Newman Nickels, Ph.D. with consideration to Mr. Seckinger current level of functioning, the nature of his symptoms, emotional and behavioral responses during interview, level of literacy, observed level of motivation/effort, and the nature of the referral question. This battery was communicated to the psychometrist. Communication between Dr. Newman Nickels, Ph.D. and the psychometrist was ongoing throughout the evaluation and Dr. Newman Nickels, Ph.D. was immediately accessible at all times. Dr. Newman Nickels, Ph.D. provided supervision to the psychometrist on the date of this service to the extent necessary to assure the quality of all services provided.    Laurence Ferrari will return within approximately 1-2 weeks for an interactive feedback session with Dr. Milbert Coulter at which time his test performances, clinical impressions, and treatment recommendations will be reviewed in detail. Mr. Settle understands he can contact our office should he require our assistance before this time.  A total of 140 minutes of billable time were spent face-to-face with Mr. Mubarak by the psychometrist. This includes both test administration and scoring time. Billing for these services is reflected in the clinical report generated by Dr. Newman Nickels, Ph.D.  This note reflects time spent with the psychometrician and does not include test scores or any clinical interpretations made by Dr. Milbert Coulter. The full report will follow in a separate note.

## 2023-01-06 NOTE — Progress Notes (Signed)
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 represents a clinical evaluation for the purposes previously outlined by the referral source and is in no way reflective of a forensic evaluation.   Mr. Kue was provided with a verbal description of the nature and purpose of the present neuropsychological evaluation. Also reviewed were the foreseeable risks and/or discomforts and benefits of the procedure, limits of confidentiality, and mandatory reporting requirements of this provider. The patient was given the opportunity to ask questions and receive answers about the evaluation. Oral consent to participate was provided by the patient.   This evaluation was conducted by Newman Nickels, Ph.D., ABPP-CN, board certified clinical neuropsychologist. Mr. Ende completed a clinical interview with Dr. Milbert Coulter,  billed as one unit 90791, and 140 minutes of cognitive testing and scoring, billed as one unit [phone] and four additional units 96139.  Psychometrist Wallace Keller, B.S., assisted Dr. Milbert Coulter with test administration and scoring procedures. As a separate and discrete service, Dr. Milbert Coulter spent a total of 160 minutes in interpretation and report writing billed as one unit [phone] and two units 96133.

## 2023-01-09 ENCOUNTER — Encounter: Payer: Self-pay | Admitting: Psychology

## 2023-01-09 DIAGNOSIS — I6381 Other cerebral infarction due to occlusion or stenosis of small artery: Secondary | ICD-10-CM | POA: Insufficient documentation

## 2023-01-12 ENCOUNTER — Encounter: Payer: PPO | Admitting: Psychology

## 2023-01-12 DIAGNOSIS — U071 COVID-19: Secondary | ICD-10-CM | POA: Diagnosis not present

## 2023-01-12 DIAGNOSIS — J984 Other disorders of lung: Secondary | ICD-10-CM | POA: Diagnosis not present

## 2023-01-16 ENCOUNTER — Encounter: Payer: PPO | Admitting: Psychology

## 2023-01-19 ENCOUNTER — Telehealth: Payer: Self-pay | Admitting: Internal Medicine

## 2023-01-19 NOTE — Telephone Encounter (Signed)
Pt. Calling wants to be switch back from umeclidinium-vilanterol [facility] ELLIPTA) 62.5-25 MCG/ACT to the Trelgy again can we please advise

## 2023-01-19 NOTE — Telephone Encounter (Signed)
Called and spoke with patient. He stated that he would like to switch back to Anoro. Since he has been on Anoro, his SOB has increased and he developed COVID despite not increasing his time outside of the house and being around people.   Because of this, he wants to switch back to Trelegy . Pharmacy is [facility]. If the switch is approved, he would like for it to be a 90 day supply.   Dr. Maple Hudson, please advise if you are ok with this switch. Thanks!

## 2023-01-19 NOTE — Telephone Encounter (Signed)
Ok DC [employer] Rx Trelegy 100, # 180,  Inhale 1 puff then rinse mouth, once daily, ref x 3

## 2023-01-20 MED ORDER — TRELEGY ELLIPTA 100-62.5-25 MCG/ACT IN AEPB
1.0000 | INHALATION_SPRAY | Freq: Every day | RESPIRATORY_TRACT | 1 refills | Status: DC
  Filled 2023-01-20: qty 180, 90d supply, fill #0
  Filled 2023-01-23: qty 60, 30d supply, fill #0
  Filled 2023-02-17 (×2): qty 60, 30d supply, fill #1
  Filled 2023-03-19: qty 60, 30d supply, fill #2
  Filled 2023-04-25: qty 60, 30d supply, fill #3
  Filled 2023-05-24 (×2): qty 60, 30d supply, fill #4
  Filled 2023-06-22: qty 60, 30d supply, fill #5

## 2023-01-20 NOTE — Telephone Encounter (Signed)
Called and spoke with patient. He verbalized understanding. RX for Trelegy has been placed.   Nothing further needed at time of call.

## 2023-01-21 ENCOUNTER — Encounter (HOSPITAL_COMMUNITY): Payer: Self-pay

## 2023-01-21 ENCOUNTER — Other Ambulatory Visit (HOSPITAL_COMMUNITY): Payer: Self-pay

## 2023-01-23 ENCOUNTER — Other Ambulatory Visit (HOSPITAL_COMMUNITY): Payer: Self-pay

## 2023-01-23 ENCOUNTER — Other Ambulatory Visit: Payer: Self-pay

## 2023-01-30 DIAGNOSIS — G4733 Obstructive sleep apnea (adult) (pediatric): Secondary | ICD-10-CM | POA: Diagnosis not present

## 2023-02-17 ENCOUNTER — Other Ambulatory Visit: Payer: Self-pay

## 2023-02-17 ENCOUNTER — Other Ambulatory Visit (HOSPITAL_COMMUNITY): Payer: Self-pay

## 2023-03-02 DIAGNOSIS — G4733 Obstructive sleep apnea (adult) (pediatric): Secondary | ICD-10-CM | POA: Diagnosis not present

## 2023-03-04 ENCOUNTER — Other Ambulatory Visit (HOSPITAL_COMMUNITY): Payer: Self-pay

## 2023-03-08 ENCOUNTER — Other Ambulatory Visit (HOSPITAL_COMMUNITY): Payer: Self-pay

## 2023-03-10 ENCOUNTER — Encounter: Payer: Self-pay | Admitting: Gastroenterology

## 2023-03-14 DIAGNOSIS — G4733 Obstructive sleep apnea (adult) (pediatric): Secondary | ICD-10-CM | POA: Diagnosis not present

## 2023-03-16 ENCOUNTER — Other Ambulatory Visit (HOSPITAL_COMMUNITY): Payer: Self-pay

## 2023-03-16 ENCOUNTER — Other Ambulatory Visit: Payer: Self-pay

## 2023-03-16 MED ORDER — FLUTICASONE PROPIONATE 50 MCG/ACT NA SUSP
2.0000 | Freq: Every day | NASAL | 3 refills | Status: DC
  Filled 2023-03-16: qty 48, 90d supply, fill #0
  Filled 2023-06-22: qty 48, 90d supply, fill #1
  Filled 2023-09-16: qty 48, 90d supply, fill #2
  Filled 2024-03-01: qty 48, 90d supply, fill #3

## 2023-03-19 ENCOUNTER — Other Ambulatory Visit (HOSPITAL_COMMUNITY): Payer: Self-pay

## 2023-03-20 ENCOUNTER — Other Ambulatory Visit (HOSPITAL_COMMUNITY): Payer: Self-pay

## 2023-03-20 ENCOUNTER — Other Ambulatory Visit: Payer: Self-pay

## 2023-03-28 DIAGNOSIS — H52223 Regular astigmatism, bilateral: Secondary | ICD-10-CM | POA: Diagnosis not present

## 2023-03-28 DIAGNOSIS — H35373 Puckering of macula, bilateral: Secondary | ICD-10-CM | POA: Diagnosis not present

## 2023-03-28 DIAGNOSIS — H35371 Puckering of macula, right eye: Secondary | ICD-10-CM | POA: Diagnosis not present

## 2023-03-28 DIAGNOSIS — Z0100 Encounter for examination of eyes and vision without abnormal findings: Secondary | ICD-10-CM | POA: Diagnosis not present

## 2023-03-28 DIAGNOSIS — H25813 Combined forms of age-related cataract, bilateral: Secondary | ICD-10-CM | POA: Diagnosis not present

## 2023-03-28 DIAGNOSIS — H524 Presbyopia: Secondary | ICD-10-CM | POA: Diagnosis not present

## 2023-03-29 IMAGING — CT CT HEART MORP W/ CTA COR W/ SCORE W/ CA W/CM &/OR W/O CM
4 of 7 series · 8 of 20 positions shown, 9 images · IV contrast (APPLIED)
Comparison: 12/02/2019 chest radiograph.
COMPARISON: 12/02/2019 chest radiograph.

Addendum:
EXAM:
OVER-READ INTERPRETATION  CT CHEST

The following report is an over-read performed by radiologist Dr.
Lorenz Jumper [REDACTED] on 01/05/2021. This over-read
does not include interpretation of cardiac or coronary anatomy or
pathology. The coronary CTA interpretation by the cardiologist is
attached.
HISTORY: Chest pain/anginal equiv, 10yr CHD risk > 20%, not treadmill
candidate
Cardiac/Coronary CT
TECHNIQUE: The patient was scanned on a Siemens Force scanner.
PROTOCOL: A 100 kV prospective scan was triggered in the descending thoracic
aorta at 111 HU's. Axial non-contrast 3 mm slices were carried out
through the heart. The data set was analyzed on a dedicated work
station and scored using the Agatson method. Gantry rotation speed
was 250 msecs and collimation was 0.6 mm. Heart rate optimized
medically, and 0.8 mg of sublingual nitroglycerin was given. The 3D
data set was reconstructed in 5% intervals of 35-75% of the R-R
cycle. Diastolic phases were analyzed on a dedicated work station
using MPR, MIP and VRT modes. The patient received 90mL OMNIPAQUE
IOHEXOL 350 MG/ML SOLN of contrast.

[Series 6: best diast 73 % · axial · 0.44mm/px · z∈[-298,-253]mm · 2 of 333 slices shown, 3 images]
[im 111/333  vessel]
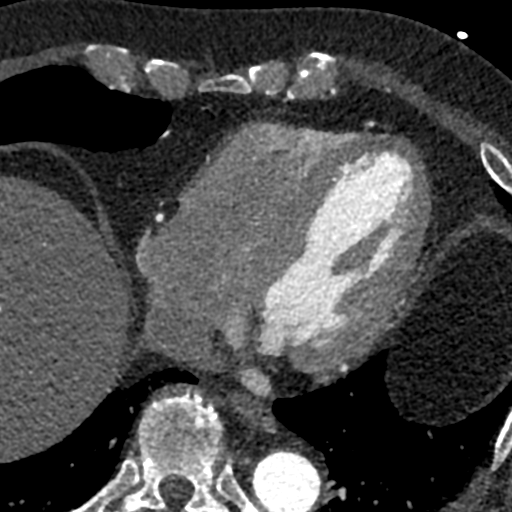
[im 111/333  lung]
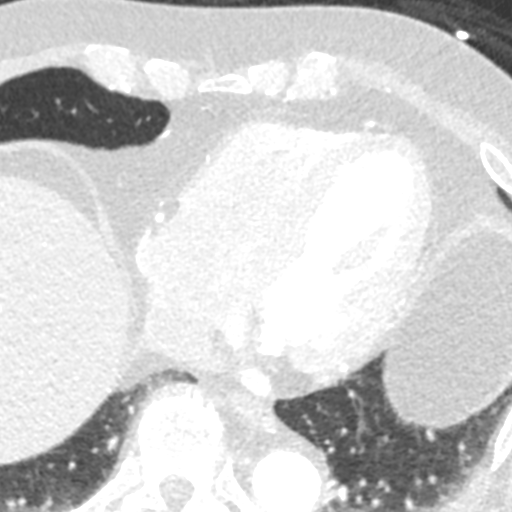
[im 222/333  vessel]
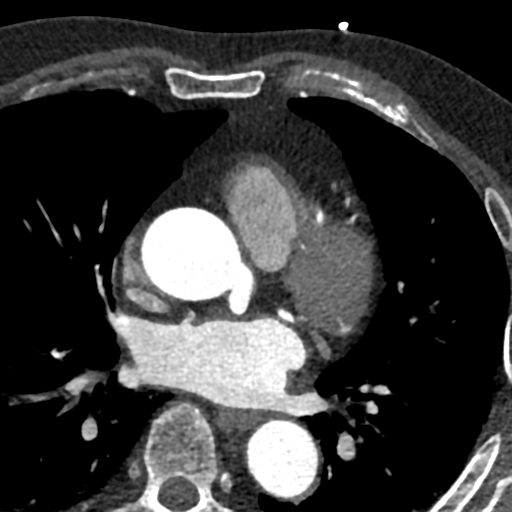

[Series 7: best syst 33 % · axial · 0.44mm/px · z∈[-298,-253]mm · 2 of 333 slices shown]
[im 111/333  vessel]
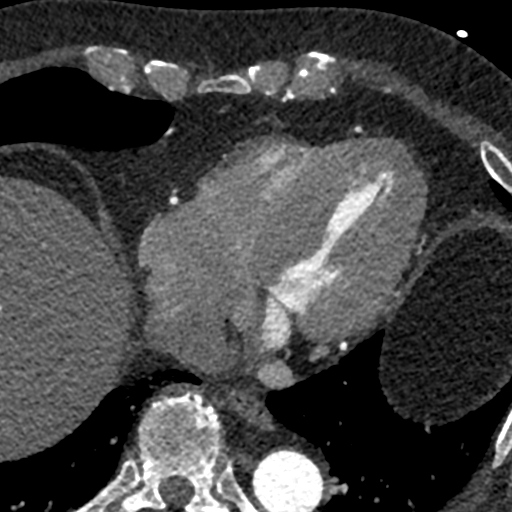
[im 222/333  vessel]
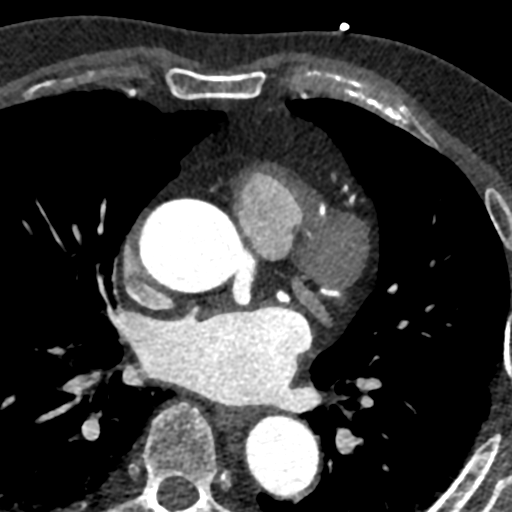

[Series 8: ts diast sharp 73 % · axial · 0.44mm/px · z∈[-298,-253]mm · 2 of 333 slices shown]
[im 111/333  lung]
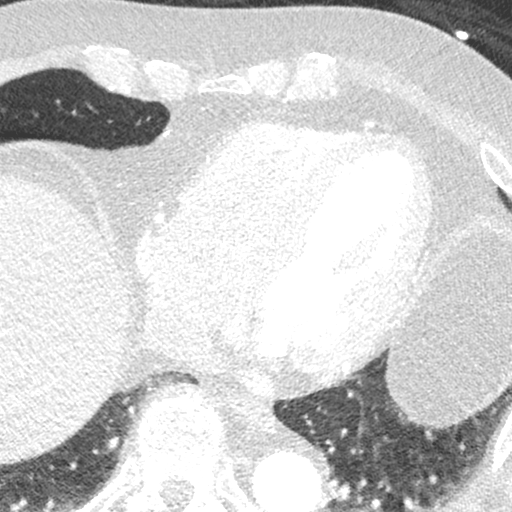
[im 222/333  lung]
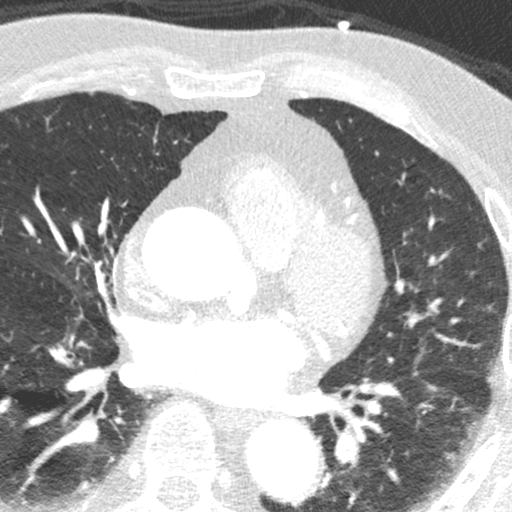

[Series 9: ts syst sharp 33 % · axial · 0.44mm/px · z∈[-298,-253]mm · 2 of 333 slices shown]
[im 111/333  lung]
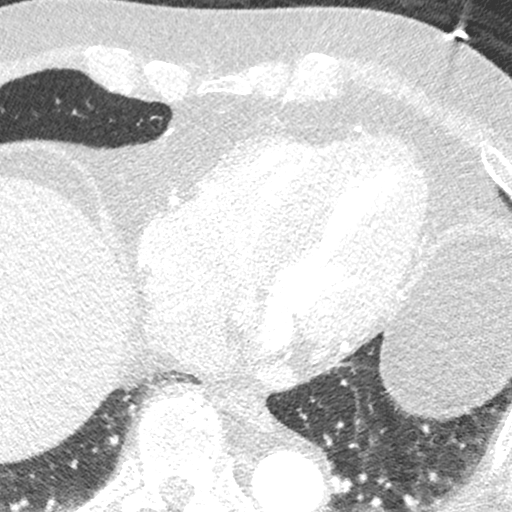
[im 222/333  lung]
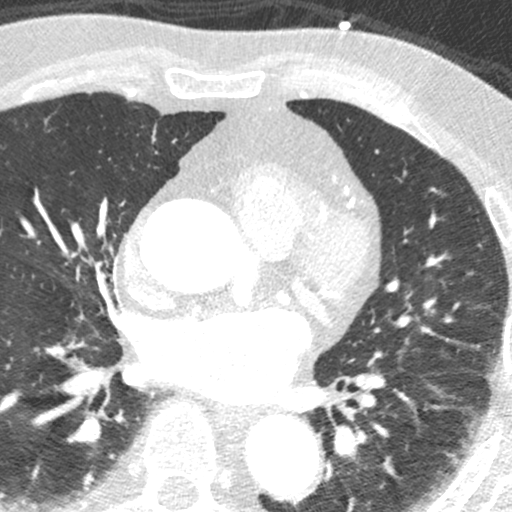

[8 of 20 positions shown; findings below may reference images not displayed]

FINDINGS: Vascular: Aortic atherosclerosis. Tortuous thoracic aorta. No
central pulmonary embolism, on this non-dedicated study.

Mediastinum/Nodes: No imaged thoracic adenopathy.

Lungs/Pleura: No pleural fluid. Lingular scarring or subsegmental
atelectasis.

Upper Abdomen: Too small to characterize high left hepatic lobe
lesions are most likely cysts. Normal imaged portions of the spleen,
stomach, gallbladder.

Musculoskeletal: No acute osseous abnormality.
IMPRESSION: 1. No acute findings in the imaged extracardiac chest.
2. Aortic Atherosclerosis (ZC8KF-NOR.R).
FINDINGS: Coronary calcium score: The patient's coronary artery calcium score
is 79, which places the patient in the 26 percentile.

Coronary arteries: Normal coronary origins.  Right dominance.

Right Coronary Artery: Normal caliber vessel, gives rise to PDA.
Trivial calcified plaque with 1-24% maximum stenosis.

Left Main Coronary Artery: Normal caliber vessel. No significant
plaque or stenosis.

Left Anterior Descending Coronary Artery: Normal caliber vessel.
Scattered trivial calcified plaque with 1-24% maximum stenosis.
Gives rise to 3 diagonal branches.

Left Circumflex Artery: Normal caliber vessel. Trivial plaque
without significant stenosis. Gives rise to 2 OM branches.

Aorta: Mildly dilated size, 41 mm at the mid ascending aorta (level
of the PA bifurcation) measured double oblique. Scattered
calcifications consistent with aortic atherosclerosis. No
dissection.

Aortic Valve: No calcifications. Trileaflet.

Other findings:

Normal pulmonary vein drainage into the left atrium.

Normal left atrial appendage without a thrombus. There is a small
accessory left atrial appendage vs. Trivial coronary artery fistula,
see saved images. Cannot be fully evaluated on this study.

Normal size of the pulmonary artery.
IMPRESSION: 1.  Minimal nonobstructive CAD, CADRADS = 1.

2. Coronary calcium score of 79. This was 26th percentile for age
and sex matched control.

3. Normal coronary origin with right dominance.

4.  Mildly dilated ascending aorta.

INTERPRETATION:

1. CAD-RADS 0: No evidence of CAD (0%). Consider non-atherosclerotic
causes of chest pain.

2. CAD-RADS 1: Minimal non-obstructive CAD (0-24%). Consider
non-atherosclerotic causes of chest pain. Consider preventive
therapy and risk factor modification.

3. CAD-RADS 2: Mild non-obstructive CAD (25-49%). Consider
non-atherosclerotic causes of chest pain. Consider preventive
therapy and risk factor modification.

4. CAD-RADS 3: Moderate stenosis (50-69%). Consider symptom-guided
anti-ischemic pharmacotherapy as well as risk factor modification
per guideline directed care. Additional analysis with CT FFR will be
submitted.

5. CAD-RADS 4: Severe stenosis. (70-99% or > 50% left main). Cardiac
catheterization or CT FFR is recommended. Consider symptom-guided
anti-ischemic pharmacotherapy as well as risk factor modification
per guideline directed care. Invasive coronary angiography
recommended with revascularization per published guideline
statements.

6. CAD-RADS 5: Total coronary occlusion (100%). Consider cardiac
catheterization or viability assessment. Consider symptom-guided
anti-ischemic pharmacotherapy as well as risk factor modification
per guideline directed care.

7. CAD-RADS N: Non-diagnostic study. Obstructive CAD can't be
excluded. Alternative evaluation is recommended.

*** End of Addendum ***
EXAM:
OVER-READ INTERPRETATION  CT CHEST

The following report is an over-read performed by radiologist Dr.
Lorenz Jumper [REDACTED] on 01/05/2021. This over-read
does not include interpretation of cardiac or coronary anatomy or
pathology. The coronary CTA interpretation by the cardiologist is
attached.
FINDINGS: Vascular: Aortic atherosclerosis. Tortuous thoracic aorta. No
central pulmonary embolism, on this non-dedicated study.

Mediastinum/Nodes: No imaged thoracic adenopathy.

Lungs/Pleura: No pleural fluid. Lingular scarring or subsegmental
atelectasis.

Upper Abdomen: Too small to characterize high left hepatic lobe
lesions are most likely cysts. Normal imaged portions of the spleen,
stomach, gallbladder.

Musculoskeletal: No acute osseous abnormality.
IMPRESSION: 1. No acute findings in the imaged extracardiac chest.
2. Aortic Atherosclerosis (ZC8KF-NOR.R).

## 2023-04-01 DIAGNOSIS — G4733 Obstructive sleep apnea (adult) (pediatric): Secondary | ICD-10-CM | POA: Diagnosis not present

## 2023-04-05 ENCOUNTER — Encounter: Payer: Self-pay | Admitting: Gastroenterology

## 2023-04-05 ENCOUNTER — Telehealth: Payer: Self-pay

## 2023-04-05 ENCOUNTER — Ambulatory Visit (AMBULATORY_SURGERY_CENTER): Payer: PPO

## 2023-04-05 VITALS — Ht 70.0 in | Wt 200.0 lb

## 2023-04-05 DIAGNOSIS — Z86010 Personal history of colonic polyps: Secondary | ICD-10-CM

## 2023-04-05 MED ORDER — PEG 3350-KCL-NA BICARB-NACL 420 G PO SOLR
4000.0000 mL | Freq: Once | ORAL | 0 refills | Status: AC
  Filled 2023-04-05: qty 4000, 1d supply, fill #0

## 2023-04-05 NOTE — Telephone Encounter (Signed)
Daniel Terrell, Please advise if patient can proceed with his procedure being at [facility] on 04/28/2023.  The patient had a PV today and advised the PV RN that he is currently, and has been, on O2 at home while he is wearing his CPAP;  The patient also reports that his previous procedure was done at [facility] on 02/25/2022 and he was on home O2 with his CPAP at that time as well.     Please advise if patient is cleared to have his procedure at [facility] or does his procedure need to be completed at the hospital?   Please/thank you Daniel Terrell

## 2023-04-05 NOTE — Progress Notes (Signed)
Pre visit completed via phone call; Patient verified name, DOB, and address;  No egg or soy allergy known to patient; No issues known to pt with past sedation with any surgeries or procedures; Patient denies ever being told they had issues or difficulty with intubation;  No FH of Malignant Hyperthermia; Pt is not on diet pills; Pt is on home 02 with his CPAP at night; Telephone message sent to Cathlyn Parsons (anesthesia) due to this being known during PV appt;  Pt is not on blood thinners;  Pt denies issues with constipation;  No A fib or A flutter; Have any cardiac testing pending--NO Pt instructed to use Single[employer] or GoodRx for a price reduction on prep;   Insurance verified during PV appt=[employer] Medicare  [name] chart reviewed by Cathlyn Parsons CNRA prior to previsit and patient appropriate for the [facility].  Previsit completed and red dot placed by patient's name on their procedure day (on provider's schedule).    Instructions sent to patient via MyChart per his request; Patient notified during PV appt that procedure may be rescheduled at [facility] due to the patient being on O2 at home with his CPAP; patient verbalized understanding and prefers instructions be sent to MyChart should this be the case;

## 2023-04-05 NOTE — Progress Notes (Deleted)
Daniel Terrell, Please advise if patient can proceed with his procedure being at [facility] on 04/28/2023.  The patient had a PV today and advised the PV RN that he is currently, and has been, on O2 at home while he is wearing his CPAP;  The patient also reports that his previous procedure was done at [facility] on 02/25/2022 and he was on home O2 with his CPAP at that time as well.     Please advise if patient is cleared to have his procedure at [facility] or does his procedure need to be completed at the hospital?   Please/thank you Bre 

## 2023-04-06 ENCOUNTER — Other Ambulatory Visit: Payer: Self-pay

## 2023-04-07 NOTE — Telephone Encounter (Signed)
Left message on voicemail for patient to call back to the office to receive this information;

## 2023-04-10 ENCOUNTER — Telehealth: Payer: Self-pay | Admitting: Internal Medicine

## 2023-04-10 ENCOUNTER — Other Ambulatory Visit: Payer: Self-pay

## 2023-04-10 ENCOUNTER — Other Ambulatory Visit (HOSPITAL_COMMUNITY): Payer: Self-pay

## 2023-04-10 MED ORDER — ARMODAFINIL 250 MG PO TABS
125.0000 mg | ORAL_TABLET | Freq: Every day | ORAL | 1 refills | Status: DC | PRN
  Filled 2023-04-10: qty 90, 90d supply, fill #0

## 2023-04-10 NOTE — Telephone Encounter (Signed)
Patient has requested refill of Armodafinil 250mg -he is wanting a 85month supply Please sent to [facility] long pharmacy

## 2023-04-10 NOTE — Telephone Encounter (Signed)
Attempted to reach patient;  unable to speak with patient; left message on voicemail for the patient to call back to the office for this information;

## 2023-04-10 NOTE — Telephone Encounter (Signed)
Patient states needs refill for Armodafinil. Needs 3 month supply. Pharmacy is [facility]. Patient phone number is [phone].

## 2023-04-10 NOTE — Telephone Encounter (Signed)
Spoke with patient. Advised prescription has been sent to pharmacy. NFN

## 2023-04-10 NOTE — Telephone Encounter (Signed)
Armodafinil refilled at [facility]

## 2023-04-10 NOTE — Telephone Encounter (Signed)
Patient returned call to the office; RN spoke with patient;  patient informed of information on the previous message;  Patient verbalized understanding of information/instructions;  Patient advised to call back to the office at [phone] should questions/concerns arise;

## 2023-04-18 DIAGNOSIS — E559 Vitamin D deficiency, unspecified: Secondary | ICD-10-CM | POA: Diagnosis not present

## 2023-04-18 DIAGNOSIS — E785 Hyperlipidemia, unspecified: Secondary | ICD-10-CM | POA: Diagnosis not present

## 2023-04-18 DIAGNOSIS — J449 Chronic obstructive pulmonary disease, unspecified: Secondary | ICD-10-CM | POA: Diagnosis not present

## 2023-04-18 DIAGNOSIS — M791 Myalgia, unspecified site: Secondary | ICD-10-CM | POA: Diagnosis not present

## 2023-04-18 DIAGNOSIS — I10 Essential (primary) hypertension: Secondary | ICD-10-CM | POA: Diagnosis not present

## 2023-04-18 DIAGNOSIS — D582 Other hemoglobinopathies: Secondary | ICD-10-CM | POA: Diagnosis not present

## 2023-04-18 DIAGNOSIS — G250 Essential tremor: Secondary | ICD-10-CM | POA: Diagnosis not present

## 2023-04-18 DIAGNOSIS — I700 Atherosclerosis of aorta: Secondary | ICD-10-CM | POA: Diagnosis not present

## 2023-04-18 DIAGNOSIS — F330 Major depressive disorder, recurrent, mild: Secondary | ICD-10-CM | POA: Diagnosis not present

## 2023-04-25 ENCOUNTER — Other Ambulatory Visit (HOSPITAL_COMMUNITY): Payer: Self-pay

## 2023-04-25 ENCOUNTER — Other Ambulatory Visit: Payer: Self-pay

## 2023-04-25 MED ORDER — MELOXICAM 15 MG PO TABS
15.0000 mg | ORAL_TABLET | Freq: Every day | ORAL | 1 refills | Status: DC
  Filled 2023-04-25 (×2): qty 90, 90d supply, fill #0
  Filled 2023-07-11: qty 90, 90d supply, fill #1

## 2023-04-26 ENCOUNTER — Encounter: Payer: PPO | Admitting: Gastroenterology

## 2023-04-28 ENCOUNTER — Encounter: Payer: Self-pay | Admitting: Gastroenterology

## 2023-04-28 ENCOUNTER — Ambulatory Visit (AMBULATORY_SURGERY_CENTER): Payer: PPO | Admitting: Gastroenterology

## 2023-04-28 VITALS — BP 119/66 | HR 66 | Temp 98.6°F | Resp 16 | Ht 70.0 in | Wt 200.0 lb

## 2023-04-28 DIAGNOSIS — Z09 Encounter for follow-up examination after completed treatment for conditions other than malignant neoplasm: Secondary | ICD-10-CM

## 2023-04-28 DIAGNOSIS — D125 Benign neoplasm of sigmoid colon: Secondary | ICD-10-CM

## 2023-04-28 DIAGNOSIS — D124 Benign neoplasm of descending colon: Secondary | ICD-10-CM | POA: Diagnosis not present

## 2023-04-28 DIAGNOSIS — D120 Benign neoplasm of cecum: Secondary | ICD-10-CM

## 2023-04-28 DIAGNOSIS — G2581 Restless legs syndrome: Secondary | ICD-10-CM | POA: Diagnosis not present

## 2023-04-28 DIAGNOSIS — K635 Polyp of colon: Secondary | ICD-10-CM

## 2023-04-28 DIAGNOSIS — D122 Benign neoplasm of ascending colon: Secondary | ICD-10-CM | POA: Diagnosis not present

## 2023-04-28 DIAGNOSIS — E7800 Pure hypercholesterolemia, unspecified: Secondary | ICD-10-CM | POA: Diagnosis not present

## 2023-04-28 DIAGNOSIS — Z86010 Personal history of colonic polyps: Secondary | ICD-10-CM

## 2023-04-28 DIAGNOSIS — J449 Chronic obstructive pulmonary disease, unspecified: Secondary | ICD-10-CM | POA: Diagnosis not present

## 2023-04-28 MED ORDER — SODIUM CHLORIDE 0.9 % IV SOLN: 500.0000 mL | Freq: Once | INTRAVENOUS | Status: DC

## 2023-04-28 NOTE — Op Note (Signed)
[facility] Patient Name: Daniel Terrell Procedure Date: 04/28/2023 10:50 AM MRN: [id number] Endoscopist: Lynann Bologna , MD, [id number] Age: 77 Referring MD:  Date of Birth: [birth year] Gender: Male Account #: [id number] Procedure:                Colonoscopy Indications:              High risk colon cancer surveillance: Personal                            history of colonic polyps - prev 14 polyps 02/2022 Medicines:                Monitored Anesthesia Care Procedure:                Pre-Anesthesia Assessment:                           - Prior to the procedure, a History and Physical                            was performed, and patient medications and                            allergies were reviewed. The patient's tolerance of                            previous anesthesia was also reviewed. The risks                            and benefits of the procedure and the sedation                            options and risks were discussed with the patient.                            All questions were answered, and informed consent                            was obtained. Prior Anticoagulants: The patient has                            taken no anticoagulant or antiplatelet agents. ASA                            Grade Assessment: III - A patient with severe                            systemic disease. After reviewing the risks and                            benefits, the patient was deemed in satisfactory                            condition to undergo the procedure.  After obtaining informed consent, the colonoscope                            was passed under direct vision. Throughout the                            procedure, the patient's blood pressure, pulse, and                            oxygen saturations were monitored continuously. The                            Olympus Scope SN: [id number] was introduced through                            the anus  and advanced to the the cecum, identified                            by appendiceal orifice and ileocecal valve. The                            colonoscopy was performed without difficulty. The                            patient tolerated the procedure well. The quality                            of the bowel preparation was good. The ileocecal                            valve, appendiceal orifice, and rectum were                            photographed. Scope In: 10:58:41 AM Scope Out: 11:20:55 AM Scope Withdrawal Time: 0 hours 19 minutes 48 seconds  Total Procedure Duration: 0 hours 22 minutes 14 seconds  Findings:                 11 sessile polyps were found in the ascending colon                            and cecum. The polyps were 5 to 6 mm in size. These                            polyps were removed with a cold snare. Resection                            and retrieval were complete.                           Six sessile polyps were found in the sigmoid colon,                            proximal descending colon and  mid descending colon.                            The polyps were 5 to 6 mm in size. These polyps                            were removed with a cold snare. Resection and                            retrieval were complete.                           Multiple medium-mouthed diverticula were found in                            the sigmoid colon.                           Non-bleeding internal hemorrhoids were found during                            retroflexion. The hemorrhoids were small and Grade                            I (internal hemorrhoids that do not prolapse).                           The exam was otherwise without abnormality on                            direct and retroflexion views. Complications:            No immediate complications. Estimated Blood Loss:     Estimated blood loss: none. Impression:               - Elevan 5 to 6 mm polyps in the ascending  colon                            and in the cecum, removed with a cold snare.                            Resected and retrieved.                           - Six 5 to 6 mm polyps in the sigmoid colon, in the                            proximal descending colon and in the mid descending                            colon, removed with a cold snare. Resected and                            retrieved.                           -  Diverticulosis in the sigmoid colon.                           - Non-bleeding internal hemorrhoids.                           - The examination was otherwise normal on direct                            and retroflexion views. Recommendation:           - Patient has a contact number available for                            emergencies. The signs and symptoms of potential                            delayed complications were discussed with the                            patient. Return to normal activities tomorrow.                            Written discharge instructions were provided to the                            patient.                           - High fiber diet.                           - Continue present medications.                           - Await pathology results.                           - Repeat colonoscopy in 1 year for surveillance                            based on pathology results.                           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 Lynann Bologna, MD 04/28/2023 11:27:49 AM This report has been signed electronically.

## 2023-04-28 NOTE — Progress Notes (Signed)
[facility] History and Physical   Primary Care Physician:  Hurshel Party, NP   Reason for Procedure:   H/O polyps  Plan:    colon     HPI: Daniel Terrell is a 77 y.o. 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   Erectile dysfunction due to arterial insufficiency 11/09/2020   Essential hypertension 12/25/2020   Essential tremor    Fracture of shaft of metacarpal bone 07/25/2022   GERD (gastroesophageal reflux disease)    Hypertriglyceridemia    IBS (irritable bowel syndrome)    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   Nocturnal hypoxemia 10/03/2022   uses O2 at night for COPD   Obstructive sleep apnea 01/06/2009   NPSG 01/12/03 AHI 42/ hr, loud snore, weight 200 lbs  Could not get comfortable with CPAP      OCD (obsessive compulsive disorder)    Pain in right hand 07/25/2022   PONV (postoperative nausea and vomiting)    Prostate cancer (HCC)    Pure hypercholesterolemia 04/30/2009   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 Authorizing Provider  amLODipine (NORVASC) 5  MG tablet Take 1 tablet (5 mg total) by mouth daily for blood pressure. 12/28/22  Yes   aspirin 81 MG tablet Take 81 mg by mouth daily.   Yes [provider]  Bempedoic Acid 180 MG TABS Take 1 tablet (180 mg) by mouth every Monday, Wednesday, and Friday. 04/29/22  Yes Baldo Daub, MD  cetirizine (ZYRTEC) 10 MG tablet Take 10 mg by mouth daily.   Yes [provider]  cholecalciferol (VITAMIN D3) 25 MCG (1000 UT) tablet Take 5,000 Units by mouth daily.   Yes [provider]  Choline Fenofibrate (TRILIPIX) 135 MG capsule Take 1 capsule (135 mg total) by mouth daily for cholesterol 09/30/22  Yes   fluticasone (FLONASE) 50 MCG/ACT nasal spray Place 2 sprays into both nostrils daily. 03/16/23  Yes   Fluticasone-Umeclidin-Vilant (TRELEGY ELLIPTA) 100-62.5-25 MCG/ACT AEPB Inhale 1 puff into the lungs daily. 01/20/23  Yes Young, Joni Fears D, MD  glucosamine-chondroitin 500-400 MG tablet Take 1 tablet by mouth 2 (two) times daily.   Yes [provider]  meloxicam (MOBIC) 15 MG tablet Take 1 tablet (15 mg total) by mouth daily for joint pain. 04/25/23  Yes   montelukast (SINGULAIR) 10 MG tablet Take 1 tablet (10 mg total) by mouth daily. 09/30/22  Yes  Multiple Vitamins-Minerals (MULTIVITAL PO) Take 1 tablet by mouth daily.   Yes [provider]  omeprazole (PRILOSEC) 20 MG capsule Take 1 capsule (20 mg total) by mouth daily. 09/30/22  Yes   OXYGEN Take 2 L by mouth at bedtime. Uses with CPAP face mask   Yes [provider]  PARoxetine (PAXIL) 30 MG tablet Take 2 tablets (60 mg total) by mouth daily. 03/24/22  Yes   propranolol ER (INDERAL LA) 80 MG 24 hr capsule Take 1 capsule (80 mg total) by mouth daily. 09/30/22  Yes   albuterol (PROVENTIL HFA;VENTOLIN HFA) 108 (90 BASE) MCG/ACT inhaler Inhale 1-2 puffs into the lungs every 6 (six) hours as needed for wheezing or shortness of breath. Patient not taking: Reported on 04/05/2023    [provider]  Armodafinil  250 MG tablet Take 0.5-1 tablets (125-250 mg total) by mouth daily as needed for sleepiness. 04/10/23   Jetty Duhamel D, MD  hyoscyamine (LEVSIN SL) 0.125 MG SL tablet Take by mouth every 6 (six) hours as needed for cramping. Patient not taking: Reported on 04/05/2023    [provider]  ipratropium-albuterol (DUONEB) 0.5-2.5 (3) MG/3ML SOLN Take 3 mLs by nebulization. Patient not taking: Reported on 04/05/2023    [provider]  nitroGLYCERIN (NITROSTAT) 0.4 MG SL tablet Place 1 tablet (0.4 mg total) under the tongue every 5 (five) minutes as needed for chest pain. Patient not taking: Reported on 04/05/2023 02/23/21 04/05/23  Baldo Daub, MD    Current Outpatient Medications  Medication Sig Dispense Refill   amLODipine (NORVASC) 5 MG tablet Take 1 tablet (5 mg total) by mouth daily for blood pressure. 90 tablet 1   aspirin 81 MG tablet Take 81 mg by mouth daily.     Bempedoic Acid 180 MG TABS Take 1 tablet (180 mg) by mouth every Monday, Wednesday, and Friday. 45 tablet 3   cetirizine (ZYRTEC) 10 MG tablet Take 10 mg by mouth daily.     cholecalciferol (VITAMIN D3) 25 MCG (1000 UT) tablet Take 5,000 Units by mouth daily.     Choline Fenofibrate (TRILIPIX) 135 MG capsule Take 1 capsule (135 mg total) by mouth daily for cholesterol 90 capsule 3   fluticasone (FLONASE) 50 MCG/ACT nasal spray Place 2 sprays into both nostrils daily. 48 g 3   Fluticasone-Umeclidin-Vilant (TRELEGY ELLIPTA) 100-62.5-25 MCG/ACT AEPB Inhale 1 puff into the lungs daily. 180 each 1   glucosamine-chondroitin 500-400 MG tablet Take 1 tablet by mouth 2 (two) times daily.     meloxicam (MOBIC) 15 MG tablet Take 1 tablet (15 mg total) by mouth daily for joint pain. 90 tablet 1   montelukast (SINGULAIR) 10 MG tablet Take 1 tablet (10 mg total) by mouth daily. 90 tablet 3   Multiple Vitamins-Minerals (MULTIVITAL PO) Take 1 tablet by mouth daily.     omeprazole (PRILOSEC) 20 MG capsule Take 1 capsule (20 mg  total) by mouth daily. 90 capsule 4   OXYGEN Take 2 L by mouth at bedtime. Uses with CPAP face mask     PARoxetine (PAXIL) 30 MG tablet Take 2 tablets (60 mg total) by mouth daily. 180 tablet 3   propranolol ER (INDERAL LA) 80 MG 24 hr capsule Take 1 capsule (80 mg total) by mouth daily. 90 capsule 4   albuterol (PROVENTIL HFA;VENTOLIN HFA) 108 (90 BASE) MCG/ACT inhaler Inhale 1-2 puffs into the lungs every 6 (six) hours as needed for wheezing or shortness of breath. (Patient not taking: Reported  on 04/05/2023)     Armodafinil 250 MG tablet Take 0.5-1 tablets (125-250 mg total) by mouth daily as needed for sleepiness. 90 tablet 1   hyoscyamine (LEVSIN SL) 0.125 MG SL tablet Take by mouth every 6 (six) hours as needed for cramping. (Patient not taking: Reported on 04/05/2023)     ipratropium-albuterol (DUONEB) 0.5-2.5 (3) MG/3ML SOLN Take 3 mLs by nebulization. (Patient not taking: Reported on 04/05/2023)     nitroGLYCERIN (NITROSTAT) 0.4 MG SL tablet Place 1 tablet (0.4 mg total) under the tongue every 5 (five) minutes as needed for chest pain. (Patient not taking: Reported on 04/05/2023) 25 tablet 3   Current Facility-Administered Medications  Medication Dose Route Frequency Provider Last Rate Last Admin   0.9 %  sodium chloride infusion  500 mL Intravenous Once Lynann Bologna, MD        Allergies as of 04/28/2023 - Review Complete 04/28/2023  Allergen Reaction Noted   Amoxicillin Nausea Only and Diarrhea 04/05/2023   Levofloxacin  01/06/2009   Moxifloxacin  06/03/2014   Ofloxacin  01/06/2009   Rosuvastatin  04/05/2023   Rosuvastatin calcium Other (See Comments) 02/23/2021   Topiramate Other (See Comments) 03/22/2017    Family History  Problem Relation Age of Onset   Colon polyps Mother    Tremor Mother    Breast cancer Mother    Memory loss Mother        early 68s   Heart attack Father    Alcohol abuse Father    Heart disease Father    Breast cancer Maternal Grandmother    Dementia  Maternal Grandmother    Colon cancer Maternal Grandfather    Esophageal cancer Neg Hx    Stomach cancer Neg Hx    Rectal cancer Neg Hx     Social History   Socioeconomic History   Marital status: Married    Spouse name: Alvino Chapel   Number of children: 3   Years of education: 16   Highest education level: Bachelor's degree (e.g., BA, AB, BS)  Occupational History   Occupation: Retired    Comment: Education officer, environmental  Tobacco Use   Smoking status: Former    Current packs/day: 0.00    Types: Cigarettes    Quit date: 09/26/1970    Years since quitting: 52.6   Smokeless tobacco: Never  Vaping Use   Vaping status: Never Used  Substance and Sexual Activity   Alcohol use: No   Drug use: No   Sexual activity: Not on file  Other Topics Concern   Not on file  Social History Narrative   Patient is married with 3 children.   Patient is right handed.   Patient has a Bachelor's degree.   Patient drinks 2-3 cups daily.      Social Determinants of Health   Financial Resource Strain: Not on file  Food Insecurity: Not on file  Transportation Needs: Not on file  Physical Activity: Not on file  Stress: Not on file  Social Connections: Not on file  Intimate Partner Violence: Not on file    Review of Systems: Positive for none All other review of systems negative except as mentioned in the HPI.  Physical Exam: Vital signs in last 24 hours: @VSRANGES @   General:   Alert,  Well-developed, well-nourished, pleasant and cooperative in NAD Lungs:  Clear throughout to auscultation.   Heart:  Regular rate and rhythm; no murmurs, clicks, rubs,  or gallops. Abdomen:  Soft, nontender and nondistended. Normal bowel sounds.   Neuro/Psych:  Alert and cooperative. Normal mood and affect. A and O x 3    No significant changes were identified.  The patient continues to be an appropriate candidate for the planned procedure and anesthesia.   Edman Circle, MD. [facility] 04/28/2023 10:51 AM@

## 2023-04-28 NOTE — Progress Notes (Signed)
Pt's states no medical or surgical changes since previsit or office visit. 

## 2023-04-28 NOTE — Progress Notes (Signed)
Uneventful anesthetic. Report to pacu rn. Vss. Care resumed by rn. 

## 2023-04-28 NOTE — Patient Instructions (Addendum)
Recommendation:           - Patient has a contact number available for                            emergencies. The signs and symptoms of potential                            delayed complications were discussed with the                            patient. Return to normal activities tomorrow.                            Written discharge instructions were provided to the                            patient.                           - High fiber diet.                           - Continue present medications.                           - Await pathology results.                           - Repeat colonoscopy in 1 year for surveillance                            based on pathology results.                           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        Document Information   Handouts on diverticulosis, hemorrhoids,polyps and hogh fiber diet given.  You HAD AN ENDOSCOPIC PROCEDURE TODAY AT THE [facility]:   Refer to the procedure report that was given to you for any specific questions about what was found during the examination.  If the procedure report does not answer your questions, please call your gastroenterologist to clarify.  If you requested that your care partner not be given the details of your procedure findings, then the procedure report has been included in a sealed envelope for you to review at your convenience later.  YOU SHOULD EXPECT: Some feelings of bloating in the abdomen. Passage of more gas than usual.  Walking can help get rid of the air that was put into your GI tract during the procedure and reduce the bloating. If you had a lower endoscopy (such as a colonoscopy or flexible sigmoidoscopy) you may notice spotting of blood in your  stool or on the toilet paper. If you underwent a bowel prep for your procedure, you may not have a  normal bowel movement for a few days.  Please Note:  You might notice some irritation and congestion in your nose or some drainage.  This is from the oxygen used during your procedure.  There is no need for concern and it should clear up in a day or so.  SYMPTOMS TO REPORT IMMEDIATELY:  Following lower endoscopy (colonoscopy or flexible sigmoidoscopy):  Excessive amounts of blood in the stool  Significant tenderness or worsening of abdominal pains  Swelling of the abdomen that is new, acute  Fever of 100F or higher  For urgent or emergent issues, a gastroenterologist can be reached at any hour by calling [phone]. Do not use MyChart messaging for urgent concerns.    DIET:  We do recommend a small meal at first, but then you may proceed to your regular diet.  Drink plenty of fluids but you should avoid alcoholic beverages for 24 hours.  ACTIVITY:  You should plan to take it easy for the rest of today and you should NOT DRIVE or use heavy machinery until tomorrow (because of the sedation medicines used during the test).    FOLLOW UP: Our staff will call the number listed on your records the next business day following your procedure.  We will call around 7:15- 8:00 am to check on you and address any questions or concerns that you may have regarding the information given to you following your procedure. If we do not reach you, we will leave a message.     If any biopsies were taken you will be contacted by phone or by letter within the next 1-3 weeks.  Please call us at [phone] if you have not heard about the biopsies in 3 weeks.    SIGNATURES/CONFIDENTIALITY: You and/or your care partner have signed paperwork which will be entered into your electronic medical record.  These signatures attest to the fact that that the information above on your After Visit Summary has been reviewed and is understood.  Full responsibility of the confidentiality of this discharge information lies  with you and/or your care-partner.

## 2023-04-28 NOTE — Progress Notes (Signed)
Called to room to assist during endoscopic procedure.  Patient ID and intended procedure confirmed with present staff. Received instructions for my participation in the procedure from the performing physician.  

## 2023-05-01 ENCOUNTER — Telehealth: Payer: Self-pay | Admitting: *Deleted

## 2023-05-01 NOTE — Telephone Encounter (Signed)
No answer on  follow up call. Left message.   

## 2023-05-02 DIAGNOSIS — G4733 Obstructive sleep apnea (adult) (pediatric): Secondary | ICD-10-CM | POA: Diagnosis not present

## 2023-05-05 ENCOUNTER — Other Ambulatory Visit: Payer: Self-pay | Admitting: *Deleted

## 2023-05-05 ENCOUNTER — Encounter: Payer: Self-pay | Admitting: Gastroenterology

## 2023-05-05 DIAGNOSIS — D126 Benign neoplasm of colon, unspecified: Secondary | ICD-10-CM

## 2023-05-05 DIAGNOSIS — Z86010 Personal history of colonic polyps: Secondary | ICD-10-CM

## 2023-05-24 ENCOUNTER — Encounter (HOSPITAL_COMMUNITY): Payer: Self-pay | Admitting: Pharmacist

## 2023-05-24 ENCOUNTER — Other Ambulatory Visit (HOSPITAL_COMMUNITY): Payer: Self-pay

## 2023-05-25 ENCOUNTER — Other Ambulatory Visit: Payer: Self-pay

## 2023-05-26 ENCOUNTER — Other Ambulatory Visit (HOSPITAL_COMMUNITY): Payer: Self-pay

## 2023-05-29 ENCOUNTER — Other Ambulatory Visit (HOSPITAL_COMMUNITY): Payer: Self-pay

## 2023-05-30 ENCOUNTER — Other Ambulatory Visit: Payer: Self-pay

## 2023-05-30 ENCOUNTER — Other Ambulatory Visit (HOSPITAL_COMMUNITY): Payer: Self-pay

## 2023-05-30 MED ORDER — AMLODIPINE BESYLATE 5 MG PO TABS
5.0000 mg | ORAL_TABLET | Freq: Every day | ORAL | 1 refills | Status: DC
  Filled 2023-05-30: qty 90, 90d supply, fill #0
  Filled 2023-10-03: qty 90, 90d supply, fill #1

## 2023-05-30 MED ORDER — PAROXETINE HCL 30 MG PO TABS
60.0000 mg | ORAL_TABLET | Freq: Every day | ORAL | 1 refills | Status: DC
  Filled 2023-05-30: qty 180, 90d supply, fill #0
  Filled 2023-09-12: qty 180, 90d supply, fill #1

## 2023-06-02 DIAGNOSIS — G4733 Obstructive sleep apnea (adult) (pediatric): Secondary | ICD-10-CM | POA: Diagnosis not present

## 2023-06-06 ENCOUNTER — Other Ambulatory Visit: Payer: Self-pay | Admitting: Genetic Counselor

## 2023-06-06 ENCOUNTER — Inpatient Hospital Stay: Payer: PPO

## 2023-06-06 ENCOUNTER — Inpatient Hospital Stay: Payer: PPO | Attending: Genetic Counselor | Admitting: Genetic Counselor

## 2023-06-06 ENCOUNTER — Encounter: Payer: Self-pay | Admitting: Genetic Counselor

## 2023-06-06 ENCOUNTER — Other Ambulatory Visit (HOSPITAL_COMMUNITY)
Admission: RE | Admit: 2023-06-06 | Discharge: 2023-06-06 | Disposition: A | Discharge status: Home / Self Care | Payer: PPO | Source: Ambulatory Visit | Attending: Oncology | Admitting: Oncology

## 2023-06-06 DIAGNOSIS — Z800 Family history of malignant neoplasm of digestive organs: Secondary | ICD-10-CM | POA: Diagnosis not present

## 2023-06-06 DIAGNOSIS — Z803 Family history of malignant neoplasm of breast: Secondary | ICD-10-CM

## 2023-06-06 DIAGNOSIS — C61 Malignant neoplasm of prostate: Secondary | ICD-10-CM

## 2023-06-06 DIAGNOSIS — D126 Benign neoplasm of colon, unspecified: Secondary | ICD-10-CM | POA: Diagnosis not present

## 2023-06-06 DIAGNOSIS — Z006 Encounter for examination for normal comparison and control in clinical research program: Secondary | ICD-10-CM | POA: Insufficient documentation

## 2023-06-06 LAB — GENETIC SCREENING ORDER

## 2023-06-06 NOTE — Progress Notes (Signed)
REFERRING PROVIDER: Lynann Bologna, MD [address] [place]. [place],  [place] [address]  PRIMARY PROVIDER:  Hurshel Party, NP  PRIMARY REASON FOR VISIT:  1. Family history of breast cancer   2. Family history of colon cancer   3. Prostate cancer (HCC)   4. Adenomatous polyp of colon, unspecified part of colon      HISTORY OF PRESENT ILLNESS:   Mr. Curless, a 77 y.o. male, was seen for a [place] cancer genetics consultation at the request of Dr. Chales Abrahams due to a personal and family history of cancer.  Mr. Floro presents to clinic today to discuss the possibility of a hereditary predisposition to cancer, genetic testing, and to further clarify his future cancer risks, as well as potential cancer risks for family members.   In 2009, at the age of 6, Mr. Decandia was diagnosed with prostate cancer. The treatment plan surgery. The Gleason score = 6.  Over the years, Mr. Hammers has developed 32+ polyps, all tubular adenoma's.      CANCER HISTORY:  Oncology History   No history exists.     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   Erectile dysfunction due to arterial insufficiency 11/09/2020   Essential hypertension 12/25/2020   Essential tremor    Family history of breast cancer    Family history of colon cancer    Fracture of shaft of metacarpal bone 07/25/2022   GERD (gastroesophageal reflux disease)    Hypertriglyceridemia    IBS (irritable bowel syndrome)    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   Nocturnal hypoxemia 10/03/2022   uses O2 at night  for COPD   Obstructive sleep apnea 01/06/2009   NPSG 01/12/03 AHI 42/ hr, loud snore, weight 200 lbs  Could not get comfortable with CPAP      OCD (obsessive compulsive disorder)    Pain in right hand 07/25/2022   PONV (postoperative nausea and vomiting)    Prostate cancer (HCC)    Pure hypercholesterolemia 04/30/2009   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   Restless legs syndrome (RLS) 11/28/2013   Seasonal and perennial allergic rhinitis 01/06/2009    Past Surgical History:  Procedure Laterality Date   BLADDER SURGERY     extension   COLONOSCOPY  08/13/2009   COLONOSCOPY  02/25/2022   FINGER SURGERY     left x 3   GREAT TOE ARTHRODESIS, INTERPHALANGEAL JOINT     KNEE ARTHROSCOPY     left   PROSTATECTOMY     TONSILLECTOMY      Social History   Socioeconomic History   Marital status: Married    Spouse name: Alvino Chapel   Number of children: 3   Years of education: 16   Highest education level: Bachelor's degree (e.g., BA, AB, BS)  Occupational History   Occupation: Retired    Comment: Education officer, environmental  Tobacco Use   Smoking status: Former    Current packs/day: 0.00    Types: Cigarettes    Quit date: 09/26/1970  Years since quitting: 52.7   Smokeless tobacco: Never  Vaping Use   Vaping status: Never Used  Substance and Sexual Activity   Alcohol use: No   Drug use: No   Sexual activity: Not on file  Other Topics Concern   Not on file  Social History Narrative   Patient is married with 3 children.   Patient is right handed.   Patient has a Bachelor's degree.   Patient drinks 2-3 cups daily.      Social Determinants of Health   Financial Resource Strain: Not on file  Food Insecurity: Not on file  Transportation Needs: Not on file  Physical Activity: Not on file  Stress: Not on file  Social Connections: Not on file     FAMILY HISTORY:  We obtained a detailed, 4-generation family history.  Significant diagnoses are listed below: Family History  Problem Relation Age  of Onset   Colon polyps Mother    Tremor Mother    Breast cancer Mother 55   Memory loss Mother        early 48s   Heart attack Father    Alcohol abuse Father    Heart disease Father    Breast cancer Maternal Grandmother    Dementia Maternal Grandmother    Colon cancer Maternal Grandfather    Esophageal cancer Neg Hx    Stomach cancer Neg Hx    Rectal cancer Neg Hx      The patient has two sons, one who had melanoma at 72. He has one sister who is cancer free and a paternal half brother he has not seen in a couple decades.  Both parents are deceased.  The patient's mother had breast cancer at 61 and colon polyps.  She had one sister who was cancer free.  Her father had colon cancer.  The patient's father died of a heart attack.  He had four siblings who were cancer free and his parents were cancer free.  Mr. Brar is unaware of previous family history of genetic testing for hereditary cancer risks. [name] maternal ancestors are of American descent, and paternal ancestors are of [place] descent. There is no reported Ashkenazi Jewish ancestry. There is no known consanguinity.  GENETIC COUNSELING ASSESSMENT: Mr. Sporn is a 77 y.o. 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 We, therefore, discussed and recommended the following at today's visit.   DISCUSSION: We discussed that, in general, most cancer is not inherited in families, but instead is sporadic or familial. Sporadic cancers occur by chance and typically happen at older ages (>50 years) as this type of cancer is caused by genetic changes acquired during an individual's lifetime. Some families have more cancers than would be expected by chance; however, the ages or types of cancer are not consistent with a known genetic mutation or known genetic mutations have been ruled out. This type of familial cancer is  thought to be due to a combination of multiple genetic, environmental, hormonal, and lifestyle factors. While this combination of factors likely increases the risk of cancer, the exact source of this risk is not currently identifiable or testable.  We discussed that many people have colon polyps, with most having fewer than 5 in their lifetime.  When an individual develops over 10 colon polyps there is a suspicion for a hereditary polyposis cancer syndrome such as familial adenomatosis polyposis (FAP). There are other genes  that can be associated with hereditary polyposis cancer syndromes.  These include MUTYH and BMPR1A.  We discussed that testing is beneficial for several reasons including knowing how to follow individuals after completing their treatment, identifying whether potential treatment options such as PARP inhibitors would be beneficial, and understand if other family members could be at risk for cancer and allow them to undergo genetic testing.   We reviewed the characteristics, features and inheritance patterns of hereditary cancer syndromes. We also discussed genetic testing, including the appropriate family members to test, the process of testing, insurance coverage and turn-around-time for results. We discussed the implications of a negative, positive, carrier and/or variant of uncertain significant result. Mr. Sudbury  was offered a common hereditary cancer panel (40+ genes) and an expanded pan-cancer panel (70+ genes). Mr. Waugh was informed of the benefits and limitations of each panel, including that expanded pan-cancer panels contain genes that do not have clear management guidelines at this point in time.  We also discussed that as the number of genes included on a panel increases, the chances of variants of uncertain significance increases. Mr. Nickum decided to pursue genetic testing for the CancerNext-Expanded+RNAinsight gene panel.   The CancerNext-Expanded gene panel offered  by [facility] and includes sequencing and rearrangement analysis for the following 77 genes: AIP, ALK, APC*, ATM*, AXIN2, BAP1, BARD1, BMPR1A, BRCA1*, BRCA2*, BRIP1*, CDC73, CDH1*, CDK4, CDKN1B, CDKN2A, CHEK2*, CTNNA1, DICER1, FH, FLCN, KIF1B, LZTR1, MAX, MEN1, MET, MLH1*, MSH2*, MSH3, MSH6*, MUTYH*, NF1*, NF2, NTHL1, PALB2*, PHOX2B, PMS2*, POT1, PRKAR1A, PTCH1, PTEN*, RAD51C*, RAD51D*, RB1, RET, SDHA, SDHAF2, SDHB, SDHC, SDHD, SMAD4, SMARCA4, SMARCB1, SMARCE1, STK11, SUFU, TMEM127, TP53*, TSC1, TSC2, and VHL (sequencing and deletion/duplication); EGFR, EGLN1, HOXB13, KIT, MITF, PDGFRA, POLD1, and POLE (sequencing only); EPCAM and GREM1 (deletion/duplication only). DNA and RNA analyses performed for * genes.   Based on Mr. Eberspacher personal and family history of cancer, he meets medical criteria for genetic testing. Despite that he meets criteria, he may still have an out of pocket cost. We discussed that if his out of pocket cost for testing is over $100, the laboratory will call and confirm whether he wants to proceed with testing.  If the out of pocket cost of testing is less than $100 he will be billed by the genetic testing laboratory.   We discussed that some people do not want to undergo genetic testing due to fear of genetic discrimination.  The Genetic Information Nondiscrimination Act (GINA) was signed into federal law in 2008. GINA prohibits health insurers and most employers from discriminating against individuals based on genetic information (including the results of genetic tests and family history information). According to GINA, health insurance companies cannot consider genetic information to be a preexisting condition, nor can they use it to make decisions regarding coverage or rates. GINA also makes it illegal for most employers to use genetic information in making decisions about hiring, firing, promotion, or terms of employment. It is important to note that GINA does not offer  protections for life insurance, disability insurance, or long-term care insurance. GINA does not apply to those in the [employer], those who work for companies with less than 15 employees, and new life insurance or long-term disability insurance policies.  Health status due to a cancer diagnosis is not protected under GINA. More information about GINA can be found by visiting [email or web address].   PLAN: After considering the risks, benefits, and limitations, Mr. Enberg provided informed consent to pursue genetic testing and the blood sample was  sent to [facility] for analysis of the CancerNext-Expanded+RNAinsight. Results should be available within approximately 2-3 weeks' time, at which point they will be disclosed by telephone to Mr. Vanalst, as will any additional recommendations warranted by these results. Mr. Degraw will receive a summary of his genetic counseling visit and a copy of his results once available. This information will also be available in Epic.   Lastly, we encouraged Mr. Alvelo to remain in contact with cancer genetics annually so that we can continuously update the family history and inform him of any changes in cancer genetics and testing that may be of benefit for this family.   Mr. Pipes questions were answered to his satisfaction today. Our contact information was provided should additional questions or concerns arise. Thank you for the referral and allowing [place] to share in the care of your patient.   Mykenzie Ebanks P. Lowell Guitar, MS, [facility] Licensed, Patent attorney [email or web address] phone: [phone]  The patient was seen for a total of 35 minutes in face-to-face genetic counseling.  The patient was seen alone.  Drs. Meliton Rattan, and/or [place] were available for questions, if needed..    _______________________________________________________________________ For Office Staff:  Number of people involved in session: 1 Was an Intern/  student involved with case: no

## 2023-06-19 ENCOUNTER — Ambulatory Visit: Payer: Self-pay | Admitting: Genetic Counselor

## 2023-06-19 ENCOUNTER — Encounter: Payer: Self-pay | Admitting: Genetic Counselor

## 2023-06-19 ENCOUNTER — Telehealth: Payer: Self-pay | Admitting: Genetic Counselor

## 2023-06-19 DIAGNOSIS — Z1379 Encounter for other screening for genetic and chromosomal anomalies: Secondary | ICD-10-CM

## 2023-06-19 HISTORY — DX: Encounter for other screening for genetic and chromosomal anomalies: Z13.79

## 2023-06-19 NOTE — Progress Notes (Signed)
HPI:  Mr. Schoene was previously seen in the [facility] due to a personal history of polyposis and personal and family history of cancer and concerns regarding a hereditary predisposition to cancer. Please refer to our prior cancer genetics clinic note for more information regarding our discussion, assessment and recommendations, at the time. Mr. Worlds recent genetic test results were disclosed to him, as were recommendations warranted by these results. These results and recommendations are discussed in more detail below.  CANCER HISTORY:  Oncology History  Prostate cancer [facility])   Initial Diagnosis   Prostate cancer (HCC)   06/16/2023 Genetic Testing   Negative genetic testing on the CancerNext-Expanded+RNAinsight panel.  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  The report date is 06/16/2023.  The CancerNext-Expanded gene panel offered by [facility] and includes sequencing and rearrangement analysis for the following 77 genes: AIP, ALK, APC*, ATM*, AXIN2, BAP1, BARD1, BMPR1A, BRCA1*, BRCA2*, BRIP1*, CDC73, CDH1*, CDK4, CDKN1B, CDKN2A, CHEK2*, CTNNA1, DICER1, FH, FLCN, KIF1B, LZTR1, MAX, MEN1, MET, MLH1*, MSH2*, MSH3, MSH6*, MUTYH*, NF1*, NF2, NTHL1, PALB2*, PHOX2B, PMS2*, POT1, PRKAR1A, PTCH1, PTEN*, RAD51C*, RAD51D*, RB1, RET, SDHA, SDHAF2, SDHB, SDHC, SDHD, SMAD4, SMARCA4, SMARCB1, SMARCE1, STK11, SUFU, TMEM127, TP53*, TSC1, TSC2, and VHL (sequencing and deletion/duplication); EGFR, EGLN1, HOXB13, KIT, MITF, PDGFRA, POLD1, and POLE (sequencing only); EPCAM and GREM1 (deletion/duplication only). DNA and RNA analyses performed for * genes.      FAMILY HISTORY:  We obtained a detailed, 4-generation family history.  Significant diagnoses are listed below: Family History  Problem Relation Age of Onset   Colon polyps Mother    Tremor Mother    Breast cancer Mother 52   Memory loss Mother        early 57s   Heart attack Father    Alcohol abuse Father    Heart disease  Father    Breast cancer Maternal Grandmother    Dementia Maternal Grandmother    Colon cancer Maternal Grandfather    Esophageal cancer Neg Hx    Stomach cancer Neg Hx    Rectal cancer Neg Hx        The patient has two sons, one who had melanoma at 29. He has one sister who is cancer free and a paternal half brother he has not seen in a couple decades.  Both parents are deceased.   The patient's mother had breast cancer at 6 and colon polyps.  She had one sister who was cancer free.  Her father had colon cancer.   The patient's father died of a heart attack.  He had four siblings who were cancer free and his parents were cancer free.   Mr. Blandin is unaware of previous family history of genetic testing for hereditary cancer risks. [name] maternal ancestors are of American descent, and paternal ancestors are of [place] descent. There is no reported Ashkenazi Jewish ancestry. There is no known consanguinity  GENETIC TEST RESULTS: Genetic testing reported out on June 16, 2023 through the CancerNext-Expanded+RNAinsight cancer panel found no pathogenic mutations. The CancerNext-Expanded gene panel offered by [facility] and includes sequencing and rearrangement analysis for the following 77 genes: AIP, ALK, APC*, ATM*, AXIN2, BAP1, BARD1, BMPR1A, BRCA1*, BRCA2*, BRIP1*, CDC73, CDH1*, CDK4, CDKN1B, CDKN2A, CHEK2*, CTNNA1, DICER1, FH, FLCN, KIF1B, LZTR1, MAX, MEN1, MET, MLH1*, MSH2*, MSH3, MSH6*, MUTYH*, NF1*, NF2, NTHL1, PALB2*, PHOX2B, PMS2*, POT1, PRKAR1A, PTCH1, PTEN*, RAD51C*, RAD51D*, RB1, RET, SDHA, SDHAF2, SDHB, SDHC, SDHD, SMAD4, SMARCA4, SMARCB1, SMARCE1, STK11, SUFU, TMEM127, TP53*, TSC1, TSC2, and VHL (sequencing and  deletion/duplication); EGFR, EGLN1, HOXB13, KIT, MITF, PDGFRA, POLD1, and POLE (sequencing only); EPCAM and GREM1 (deletion/duplication only). DNA and RNA analyses performed for * genes. The test report has been scanned into EPIC and is located under the Molecular  Pathology section of the Results Review tab.  A portion of the result report is included below for reference.     We discussed with Mr. Clauss that because current genetic testing is not perfect, it is possible there may be a gene mutation in one of these genes that current testing cannot detect, but that chance is small.  We also discussed, that there could be another gene that has not yet been discovered, or that we have not yet tested, that is responsible for the cancer diagnoses in the family. It is also possible there is a hereditary cause for the cancer in the family that Mr. Charron did not inherit and therefore was not identified in his testing.  Therefore, it is important to remain in touch with cancer genetics in the future so that we can continue to offer Mr. Colliver the most up to date genetic testing.   Genetic testing did identify a variant of uncertain significance (VUS) was identified in the PMS2 gene called 5'UTR_3'UTRdup.  At this time, it is unknown if this variant is associated with increased cancer risk or if this is a normal finding, but most variants such as this get reclassified to being inconsequential. It should not be used to make medical management decisions. With time, we suspect the lab will determine the significance of this variant, if any. If we do learn more about it, we will try to contact Mr. Vizzini to discuss it further. However, it is important to stay in touch with [place] periodically and keep the address and phone number up to date.  ADDITIONAL GENETIC TESTING: We discussed with Mr. Atondo that his genetic testing was fairly extensive.  If there are genes identified to increase cancer risk that can be analyzed in the future, we would be happy to discuss and coordinate this testing at that time.    CANCER SCREENING RECOMMENDATIONS: Mr. Bogdanski test result is considered negative (normal).  This means that we have not identified a hereditary cause for his  personal history of polyposis and personal and family history of cancer at this time. Most cancers happen by chance and this negative test suggests that his personal history of polyposis and personal and family history of cancer may fall into this category.    Possible reasons for Mr. Kerkstra negative genetic test include:  1. There may be a gene mutation in one of these genes that current testing methods cannot detect but that chance is small.  2. There could be another gene that has not yet been discovered, or that we have not yet tested, that is responsible for the cancer diagnoses in the family.  3.  There may be no hereditary risk for cancer in the family. The cancers in Mr. Banish and/or his family may be sporadic/familial or due to other genetic and environmental factors. 4. It is also possible there is a hereditary cause for the cancer in the family that Mr. Magnon did not inherit.  Therefore, it is recommended he continue to follow the cancer management and screening guidelines provided by his oncology and primary healthcare provider. An individual's cancer risk and medical management are not determined by genetic test results alone. Overall cancer risk assessment incorporates additional factors, including personal medical history, family history, and  any available genetic information that may result in a personalized plan for cancer prevention and surveillance  This negative genetic test tells [place] that we cannot yet define why Mr. Garfias has had an increased number of colorectal polyps.  Mr. [facility] medical management and screening should be based on the prospect that he will likely form more colon polyps in the future and should, therefore, undergo more frequent colonoscopy screening at intervals determined by his GI providers.  We also recommended that Mr. Pasqual have an upper endoscopy periodically.  RECOMMENDATIONS FOR FAMILY MEMBERS:  Individuals in this family might be at  some increased risk of developing cancer, over the general population risk, simply due to the family history of cancer.  We recommended women in this family have a yearly mammogram beginning at age 41, or 39 years younger than the earliest onset of cancer, an annual clinical breast exam, and perform monthly breast self-exams. Women in this family should also have a gynecological exam as recommended by their primary provider. All family members should be referred for colonoscopy starting at age 1.  FOLLOW-UP: Lastly, we discussed with Mr. Mohammed that cancer genetics is a rapidly advancing field and it is possible that new genetic tests will be appropriate for him and/or his family members in the future. We encouraged him to remain in contact with cancer genetics on an annual basis so we can update his personal and family histories and let him know of advances in cancer genetics that may benefit this family.   Our contact number was provided. Mr. Schudel questions were answered to his satisfaction, and he knows he is welcome to call us at anytime with additional questions or concerns.   Maylon Cos, MS, [facility] Licensed, Certified Genetic Counselor [email or web address]

## 2023-06-19 NOTE — Telephone Encounter (Signed)
Revealed negative genetic testing.  Discussed that we do not know why he has polyposis and prostate cancer or why there is cancer in the family. It could be due to a different gene that we are not testing, or maybe our current technology may not be able to pick something up.  It will be important for him to keep in contact with genetics to keep up with whether additional testing may be needed.

## 2023-06-22 ENCOUNTER — Other Ambulatory Visit: Payer: Self-pay

## 2023-07-02 DIAGNOSIS — G4733 Obstructive sleep apnea (adult) (pediatric): Secondary | ICD-10-CM | POA: Diagnosis not present

## 2023-07-11 ENCOUNTER — Other Ambulatory Visit (HOSPITAL_COMMUNITY): Payer: Self-pay

## 2023-07-30 ENCOUNTER — Other Ambulatory Visit: Payer: Self-pay | Admitting: Internal Medicine

## 2023-08-02 DIAGNOSIS — G4733 Obstructive sleep apnea (adult) (pediatric): Secondary | ICD-10-CM | POA: Diagnosis not present

## 2023-08-04 ENCOUNTER — Other Ambulatory Visit: Payer: Self-pay

## 2023-08-04 ENCOUNTER — Other Ambulatory Visit (HOSPITAL_COMMUNITY): Payer: Self-pay

## 2023-08-04 MED ORDER — TRELEGY ELLIPTA 100-62.5-25 MCG/ACT IN AEPB
1.0000 | INHALATION_SPRAY | Freq: Every day | RESPIRATORY_TRACT | 1 refills | Status: DC
  Filled 2023-08-04: qty 180, 90d supply, fill #0
  Filled 2023-10-31: qty 180, 90d supply, fill #1

## 2023-08-07 ENCOUNTER — Other Ambulatory Visit: Payer: Self-pay

## 2023-08-10 DIAGNOSIS — R531 Weakness: Secondary | ICD-10-CM | POA: Diagnosis not present

## 2023-08-10 DIAGNOSIS — B349 Viral infection, unspecified: Secondary | ICD-10-CM | POA: Diagnosis not present

## 2023-08-10 DIAGNOSIS — R509 Fever, unspecified: Secondary | ICD-10-CM | POA: Diagnosis not present

## 2023-08-22 DIAGNOSIS — R509 Fever, unspecified: Secondary | ICD-10-CM | POA: Diagnosis not present

## 2023-08-22 DIAGNOSIS — D582 Other hemoglobinopathies: Secondary | ICD-10-CM | POA: Diagnosis not present

## 2023-08-22 DIAGNOSIS — Z125 Encounter for screening for malignant neoplasm of prostate: Secondary | ICD-10-CM | POA: Diagnosis not present

## 2023-08-22 DIAGNOSIS — E559 Vitamin D deficiency, unspecified: Secondary | ICD-10-CM | POA: Diagnosis not present

## 2023-08-22 DIAGNOSIS — F330 Major depressive disorder, recurrent, mild: Secondary | ICD-10-CM | POA: Diagnosis not present

## 2023-08-22 DIAGNOSIS — Z2821 Immunization not carried out because of patient refusal: Secondary | ICD-10-CM | POA: Diagnosis not present

## 2023-08-22 DIAGNOSIS — E538 Deficiency of other specified B group vitamins: Secondary | ICD-10-CM | POA: Diagnosis not present

## 2023-08-22 DIAGNOSIS — E785 Hyperlipidemia, unspecified: Secondary | ICD-10-CM | POA: Diagnosis not present

## 2023-08-22 DIAGNOSIS — J449 Chronic obstructive pulmonary disease, unspecified: Secondary | ICD-10-CM | POA: Diagnosis not present

## 2023-08-22 DIAGNOSIS — G250 Essential tremor: Secondary | ICD-10-CM | POA: Diagnosis not present

## 2023-08-22 DIAGNOSIS — I77811 Abdominal aortic ectasia: Secondary | ICD-10-CM | POA: Diagnosis not present

## 2023-08-22 DIAGNOSIS — I10 Essential (primary) hypertension: Secondary | ICD-10-CM | POA: Diagnosis not present

## 2023-09-12 ENCOUNTER — Other Ambulatory Visit: Payer: Self-pay

## 2023-09-12 ENCOUNTER — Other Ambulatory Visit (HOSPITAL_COMMUNITY): Payer: Self-pay

## 2023-09-14 DIAGNOSIS — M545 Low back pain, unspecified: Secondary | ICD-10-CM | POA: Diagnosis not present

## 2023-09-14 DIAGNOSIS — J208 Acute bronchitis due to other specified organisms: Secondary | ICD-10-CM | POA: Diagnosis not present

## 2023-09-14 DIAGNOSIS — B9689 Other specified bacterial agents as the cause of diseases classified elsewhere: Secondary | ICD-10-CM | POA: Diagnosis not present

## 2023-09-14 DIAGNOSIS — R059 Cough, unspecified: Secondary | ICD-10-CM | POA: Diagnosis not present

## 2023-09-15 ENCOUNTER — Other Ambulatory Visit (HOSPITAL_COMMUNITY): Payer: Self-pay

## 2023-09-15 ENCOUNTER — Other Ambulatory Visit: Payer: Self-pay

## 2023-09-15 MED ORDER — HYOSCYAMINE SULFATE 0.125 MG SL SUBL
0.1250 mg | SUBLINGUAL_TABLET | Freq: Four times a day (QID) | SUBLINGUAL | 1 refills | Status: DC | PRN
  Filled 2023-09-15 – 2023-09-29 (×2): qty 30, 8d supply, fill #0

## 2023-09-16 ENCOUNTER — Other Ambulatory Visit (HOSPITAL_COMMUNITY): Payer: Self-pay

## 2023-09-17 DIAGNOSIS — I10 Essential (primary) hypertension: Secondary | ICD-10-CM | POA: Diagnosis not present

## 2023-09-17 DIAGNOSIS — J449 Chronic obstructive pulmonary disease, unspecified: Secondary | ICD-10-CM | POA: Diagnosis not present

## 2023-09-17 DIAGNOSIS — E785 Hyperlipidemia, unspecified: Secondary | ICD-10-CM | POA: Diagnosis not present

## 2023-09-17 DIAGNOSIS — Z79899 Other long term (current) drug therapy: Secondary | ICD-10-CM | POA: Diagnosis not present

## 2023-09-17 DIAGNOSIS — I509 Heart failure, unspecified: Secondary | ICD-10-CM | POA: Diagnosis not present

## 2023-09-17 DIAGNOSIS — Z79891 Long term (current) use of opiate analgesic: Secondary | ICD-10-CM | POA: Diagnosis not present

## 2023-09-17 DIAGNOSIS — Z8249 Family history of ischemic heart disease and other diseases of the circulatory system: Secondary | ICD-10-CM | POA: Diagnosis not present

## 2023-09-17 DIAGNOSIS — G250 Essential tremor: Secondary | ICD-10-CM | POA: Diagnosis not present

## 2023-09-17 DIAGNOSIS — R918 Other nonspecific abnormal finding of lung field: Secondary | ICD-10-CM | POA: Diagnosis not present

## 2023-09-17 DIAGNOSIS — R9431 Abnormal electrocardiogram [ECG] [EKG]: Secondary | ICD-10-CM | POA: Diagnosis not present

## 2023-09-17 DIAGNOSIS — R079 Chest pain, unspecified: Secondary | ICD-10-CM | POA: Diagnosis not present

## 2023-09-17 DIAGNOSIS — Z87891 Personal history of nicotine dependence: Secondary | ICD-10-CM | POA: Diagnosis not present

## 2023-09-18 DIAGNOSIS — R079 Chest pain, unspecified: Secondary | ICD-10-CM | POA: Diagnosis not present

## 2023-09-18 DIAGNOSIS — I351 Nonrheumatic aortic (valve) insufficiency: Secondary | ICD-10-CM | POA: Diagnosis not present

## 2023-09-18 DIAGNOSIS — J449 Chronic obstructive pulmonary disease, unspecified: Secondary | ICD-10-CM | POA: Diagnosis not present

## 2023-09-18 DIAGNOSIS — Z79891 Long term (current) use of opiate analgesic: Secondary | ICD-10-CM | POA: Diagnosis not present

## 2023-09-18 LAB — LAB REPORT - SCANNED
A1c: 5.7
TSH: 2.27

## 2023-09-19 ENCOUNTER — Other Ambulatory Visit: Payer: Self-pay

## 2023-09-19 ENCOUNTER — Other Ambulatory Visit (HOSPITAL_COMMUNITY): Payer: Self-pay

## 2023-09-21 ENCOUNTER — Encounter: Payer: Self-pay | Admitting: Pulmonary Disease

## 2023-09-21 ENCOUNTER — Ambulatory Visit: Payer: PPO | Admitting: Pulmonary Disease

## 2023-09-21 ENCOUNTER — Other Ambulatory Visit (HOSPITAL_COMMUNITY): Payer: Self-pay

## 2023-09-21 ENCOUNTER — Other Ambulatory Visit (INDEPENDENT_AMBULATORY_CARE_PROVIDER_SITE_OTHER): Payer: PPO

## 2023-09-21 VITALS — BP 110/73 | HR 81 | Ht 70.0 in | Wt 201.6 lb

## 2023-09-21 DIAGNOSIS — J449 Chronic obstructive pulmonary disease, unspecified: Secondary | ICD-10-CM

## 2023-09-21 DIAGNOSIS — R918 Other nonspecific abnormal finding of lung field: Secondary | ICD-10-CM

## 2023-09-21 DIAGNOSIS — J90 Pleural effusion, not elsewhere classified: Secondary | ICD-10-CM

## 2023-09-21 DIAGNOSIS — G4733 Obstructive sleep apnea (adult) (pediatric): Secondary | ICD-10-CM | POA: Diagnosis not present

## 2023-09-21 LAB — SEDIMENTATION RATE: Sed Rate: 58 mm/h — ABNORMAL HIGH (ref 0–20)

## 2023-09-21 LAB — C-REACTIVE PROTEIN: CRP: 20.3 mg/dL — ABNORMAL HIGH (ref 0.5–20.0)

## 2023-09-21 MED ORDER — AMOXICILLIN-POT CLAVULANATE 875-125 MG PO TABS
1.0000 | ORAL_TABLET | Freq: Two times a day (BID) | ORAL | 0 refills | Status: DC
  Filled 2023-09-21 – 2023-09-26 (×3): qty 60, 30d supply, fill #0

## 2023-09-21 NOTE — Progress Notes (Signed)
Synopsis: Acute visit for hospital follow up  Subjective:   PATIENT ID: Daniel Terrell GENDER: male DOB: [birth year], MRN: [id number]   HPI  Chief Complaint  Patient presents with   Follow-up   Daniel Terrell is a 77 year old male, former smoker with COPD and OSA on CPAP who returns to pulmonary clinic for hospital follow up.  He was admitted over the weekend to [facility] for progressive chest and back pain. He presented with severe chest pain that began on the 19th of the month. The pain, located in the upper right chest, has progressively worsened, leading to hyperventilation and panic attacks. Accompanying the pain, the patient reported episodes of fever and night sweats, which were more intense than his usual episodes.   The patient was admitted to [facility] due to the severity of the symptoms. During the hospital stay, a CT scan revealed a mass in the lower right lung. The patient was discharged with a prescription for hydrocodone and ibuprofen for pain management and Augmentin plus doxycycline as an antibiotic regimen.   The patient has a history of prostate cancer, treated with a prostatectomy 13 years ago, and has been followed for COPD and sleep apnea. The patient quit smoking in 1972. Prior to the onset of the current symptoms, the patient reported a month-long illness in September, initially suspected to be COVID-19, but tests for COVID and flu were negative. The patient also reported a general lack of energy throughout the year, with a significant decrease in activity levels. Denies dysphagia issues.  The patient uses a CPAP machine for sleep apnea, but has been unable to use it recently due to the chest pain. The patient reported that the CPAP machine's water canister is not sealing properly and has not been regularly cleaning the tubing.    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   Erectile dysfunction due to arterial insufficiency 11/09/2020   Essential hypertension 12/25/2020   Essential tremor    Family history of breast cancer    Family history of colon cancer    Fracture of shaft of metacarpal bone 07/25/2022   GERD (gastroesophageal reflux disease)    Hypertriglyceridemia    IBS (irritable bowel syndrome)    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   Nocturnal hypoxemia 10/03/2022   uses O2 at night for COPD   Obstructive sleep apnea 01/06/2009   NPSG 01/12/03 AHI 42/ hr, loud snore, weight 200 lbs  Could not get comfortable with CPAP      OCD (obsessive compulsive disorder)    Pain in right hand 07/25/2022   PONV (postoperative nausea and vomiting)    Prostate cancer (HCC)    Pure hypercholesterolemia 04/30/2009   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   Restless legs syndrome (RLS) 11/28/2013   Seasonal and perennial allergic rhinitis 01/06/2009     Family History  Problem Relation Age of Onset   Colon polyps Mother    Tremor Mother    Breast cancer Mother  80   Memory loss Mother        early 30s   Heart attack Father    Alcohol abuse Father    Heart disease Father    Breast cancer Maternal Grandmother    Dementia Maternal Grandmother    Colon cancer Maternal Grandfather    Esophageal cancer Neg Hx    Stomach cancer Neg Hx    Rectal cancer Neg Hx      Social History   Socioeconomic History   Marital status: Married    Spouse name: Alvino Chapel   Number of children: 3   Years of education: 16   Highest education level: Bachelor's degree (e.g., BA, AB, BS)  Occupational History   Occupation:  Retired    Comment: Education officer, environmental  Tobacco Use   Smoking status: Former    Current packs/day: 0.00    Types: Cigarettes    Quit date: 09/26/1970    Years since quitting: 53.0   Smokeless tobacco: Never  Vaping Use   Vaping status: Never Used  Substance and Sexual Activity   Alcohol use: No   Drug use: No   Sexual activity: Not on file  Other Topics Concern   Not on file  Social History Narrative   Patient is married with 3 children.   Patient is right handed.   Patient has a Bachelor's degree.   Patient drinks 2-3 cups daily.      Social Drivers of Corporate investment banker Strain: Not on file  Food Insecurity: Not on file  Transportation Needs: Not on file  Physical Activity: Not on file  Stress: Not on file  Social Connections: Not on file  Intimate Partner Violence: Not on file     Allergies  Allergen Reactions   Amoxicillin Nausea Only and Diarrhea   Levofloxacin     REACTION: disorientation  Other Reaction(s): confusion   Moxifloxacin     Other Reaction(s): confusion   Ofloxacin     REACTION: disorientation  Other Reaction(s): confusion   Rosuvastatin     Other Reaction(s): myalgias (muscle pain)   Rosuvastatin Calcium Other (See Comments)    Muscle pain  Other Reaction(s): Myalgias   Topiramate Other (See Comments)    drowsy     Outpatient Medications Prior to Visit  Medication Sig Dispense Refill   albuterol (PROVENTIL HFA;VENTOLIN HFA) 108 (90 BASE) MCG/ACT inhaler Inhale 1-2 puffs into the lungs every 6 (six) hours as needed for wheezing or shortness of breath.     amLODipine (NORVASC) 5 MG tablet Take 1 tablet (5 mg total) by mouth daily for blood pressure. 90 tablet 1   Armodafinil 250 MG tablet Take 0.5-1 tablets (125-250 mg total) by mouth daily as needed for sleepiness. 90 tablet 1   aspirin 81 MG tablet Take 81 mg by mouth daily.     cetirizine (ZYRTEC) 10 MG tablet Take 10 mg by mouth daily.     cholecalciferol (VITAMIN D3) 25 MCG (1000 UT)  tablet Take 5,000 Units by mouth daily.     Choline Fenofibrate (TRILIPIX) 135 MG capsule Take 1 capsule (135 mg total) by mouth daily for cholesterol 90 capsule 3   fluticasone (FLONASE) 50 MCG/ACT nasal spray Place 2 sprays into both nostrils daily. 48 g 3   Fluticasone-Umeclidin-Vilant (TRELEGY ELLIPTA) 100-62.5-25 MCG/ACT AEPB Inhale 1 puff into the lungs daily. 180 each 1   glucosamine-chondroitin 500-400 MG tablet Take 1 tablet by mouth 2 (two) times daily.     hyoscyamine (LEVSIN SL) 0.125  MG SL tablet Take by mouth every 6 (six) hours as needed for cramping.     hyoscyamine (LEVSIN/SL) 0.125 MG SL tablet Take 1 tablet (0.125 mg total) by mouth every 6 (six) hours as needed for abdominal pain. 30 tablet 1   ipratropium-albuterol (DUONEB) 0.5-2.5 (3) MG/3ML SOLN Take 3 mLs by nebulization.     meloxicam (MOBIC) 15 MG tablet Take 1 tablet (15 mg total) by mouth daily for joint pain. 90 tablet 1   montelukast (SINGULAIR) 10 MG tablet Take 1 tablet (10 mg total) by mouth daily. 90 tablet 3   Multiple Vitamins-Minerals (MULTIVITAL PO) Take 1 tablet by mouth daily.     omeprazole (PRILOSEC) 20 MG capsule Take 1 capsule (20 mg total) by mouth daily. 90 capsule 4   OXYGEN Take 2 L by mouth at bedtime. Uses with CPAP face mask     PARoxetine (PAXIL) 30 MG tablet Take 2 tablets (60 mg total) by mouth daily. 180 tablet 1   propranolol ER (INDERAL LA) 80 MG 24 hr capsule Take 1 capsule (80 mg total) by mouth daily. 90 capsule 4   Bempedoic Acid 180 MG TABS Take 1 tablet (180 mg) by mouth every Monday, Wednesday, and Friday. 45 tablet 3   nitroGLYCERIN (NITROSTAT) 0.4 MG SL tablet Place 1 tablet (0.4 mg total) under the tongue every 5 (five) minutes as needed for chest pain. (Patient not taking: Reported on 04/05/2023) 25 tablet 3   No facility-administered medications prior to visit.    Review of Systems  Constitutional:  Positive for chills and malaise/fatigue. Negative for fever and weight loss.   HENT:  Negative for congestion, sinus pain and sore throat.   Eyes: Negative.   Respiratory:  Positive for cough and shortness of breath. Negative for hemoptysis, sputum production and wheezing.   Cardiovascular:  Positive for chest pain. Negative for palpitations, orthopnea, claudication and leg swelling.  Gastrointestinal:  Negative for abdominal pain, heartburn, nausea and vomiting.  Genitourinary: Negative.   Musculoskeletal:  Positive for back pain. Negative for joint pain and myalgias.  Skin:  Negative for rash.  Neurological:  Negative for weakness.  Endo/Heme/Allergies: Negative.   Psychiatric/Behavioral: Negative.        Objective:   Vitals:   09/21/23 1104  BP: 110/73  Pulse: 81  SpO2: 93%  Weight: 201 lb 9.6 oz (91.4 kg)  Height: 5\' 10"  (1.778 m)     Physical Exam Constitutional:      General: He is not in acute distress.    Appearance: Normal appearance.  Eyes:     General: No scleral icterus.    Conjunctiva/sclera: Conjunctivae normal.  Cardiovascular:     Rate and Rhythm: Normal rate and regular rhythm.  Pulmonary:     Breath sounds: Examination of the right-lower field reveals decreased breath sounds. Decreased breath sounds present. No wheezing, rhonchi or rales.  Musculoskeletal:     Right lower leg: No edema.     Left lower leg: No edema.  Skin:    General: Skin is warm and dry.  Neurological:     General: No focal deficit present.    CBC    Component Value Date/Time   WBC 4.3 (L) 02/18/2011 1351   RBC 5.00 02/18/2011 1351   HGB 16.6 02/18/2011 1351   HCT 47.3 02/18/2011 1351   PLT 192.0 02/18/2011 1351   MCV 94.7 02/18/2011 1351   MCHC 35.0 02/18/2011 1351   RDW 13.4 02/18/2011 1351   LYMPHSABS 1.0 02/18/2011 1351  MONOABS 0.4 02/18/2011 1351   EOSABS 0.1 02/18/2011 1351   BASOSABS 0.0 02/18/2011 1351      Latest Ref Rng & Units 12/31/2020   10:06 AM 02/18/2011    1:51 PM 10/16/2007    6:09 AM  BMP  Glucose 65 - 99 mg/dL 84  94   952   BUN 8 - 27 mg/dL 28  22  7    Creatinine 0.76 - 1.27 mg/dL 8.41  0.9  3.24   BUN/Creat Ratio 10 - 24 26     Sodium 134 - 144 mmol/L 138  137  136   Potassium 3.5 - 5.2 mmol/L 4.7  4.7  4.9   Chloride 96 - 106 mmol/L 103  103  101   CO2 20 - 29 mmol/L 22  28  30    Calcium 8.6 - 10.2 mg/dL 9.2  9.3  8.4    Chest imaging: CXR 06/02/22 The lungs are clear. Heart size is normal. No pneumothorax or pleural fluid. No acute or focal bony abnormality. Thoracic and lumbar degenerative change noted.  Cardiac Coronary CT Scan 01/05/21 Mediastinum/Nodes: No imaged thoracic adenopathy.   Lungs/Pleura: No pleural fluid. Lingular scarring or subsegmental atelectasis.   PFT:    Latest Ref Rng & Units 07/11/2022   10:42 AM  PFT Results  FVC-Pre L 4.03   FVC-Predicted Pre % 94   FVC-Post L 4.25   FVC-Predicted Post % 99   Pre FEV1/FVC % % 78   Post FEV1/FCV % % 79   FEV1-Pre L 3.16   FEV1-Predicted Pre % 101   FEV1-Post L 3.36   DLCO uncorrected ml/min/mmHg 23.52   DLCO UNC% % 93   DLCO corrected ml/min/mmHg 23.52   DLCO COR %Predicted % 93   DLVA Predicted % 94   TLC L 6.77   TLC % Predicted % 96   RV % Predicted % 97     Labs:  Path:  Echo:  Heart Catheterization:    Assessment & Plan:   Right lower lobe lung mass - Plan: Ambulatory referral to Interventional Radiology, amoxicillin-clavulanate (AUGMENTIN) 875-125 MG tablet, CANCELED: NM PET Image Initial (PI) Skull Base To Thigh  COPD mixed type (HCC)  Obstructive sleep apnea  Pleural effusion on right - Plan: C-reactive protein, Sed Rate (ESR)  Discussion: Daniel Terrell is a 77 year old male, former smoker with COPD and OSA on CPAP who returns to pulmonary clinic for hospital follow up.  Right Lower Lung Mass vs Abscess New onset of severe pain and diaphoresis. CT scan revealed a mass in the right lower lung. Differential diagnosis includes malignancy or infection.  -Discussed case with pulmonary colleagues  and Interventional Radiology team with general consensus that the effusion and dense consolidation in the right lower lung could be due to infection with abscess involvement - ESR and CRP are elevated on check today - Plan for extended antibiotic course with follow up in 1 month to repeat ESR/CRP labs and check CT Chest scan for close monitoring. - If no improvement will proceed with PET scan and IR referral for biopsy  Pleural Effusion  CT scan also revealed loculated pleural effusion, which could be contributing to the patient's pain.  -Continue current pain management regimen (Hydrocodone, Ibuprofen, Lidocaine patch) and monitor for effectiveness.   COPD and Sleep Apnea  Chronic conditions, currently managed with oxygen therapy and CPAP. Patient has been unable to use CPAP due to pain.  -Ensure patient is maintaining CPAP equipment properly.   Follow  up in 1 month, call sooner if needed  Daniel Comas, MD [facility] Office: [phone]   Current Outpatient Medications:    albuterol (PROVENTIL HFA;VENTOLIN HFA) 108 (90 BASE) MCG/ACT inhaler, Inhale 1-2 puffs into the lungs every 6 (six) hours as needed for wheezing or shortness of breath., Disp: , Rfl:    amLODipine (NORVASC) 5 MG tablet, Take 1 tablet (5 mg total) by mouth daily for blood pressure., Disp: 90 tablet, Rfl: 1   amoxicillin-clavulanate (AUGMENTIN) 875-125 MG tablet, Take 1 tablet by mouth 2 (two) times daily., Disp: 60 tablet, Rfl: 0   Armodafinil 250 MG tablet, Take 0.5-1 tablets (125-250 mg total) by mouth daily as needed for sleepiness., Disp: 90 tablet, Rfl: 1   aspirin 81 MG tablet, Take 81 mg by mouth daily., Disp: , Rfl:    cetirizine (ZYRTEC) 10 MG tablet, Take 10 mg by mouth daily., Disp: , Rfl:    cholecalciferol (VITAMIN D3) 25 MCG (1000 UT) tablet, Take 5,000 Units by mouth daily., Disp: , Rfl:    Choline Fenofibrate (TRILIPIX) 135 MG capsule, Take 1 capsule (135 mg total) by mouth  daily for cholesterol, Disp: 90 capsule, Rfl: 3   fluticasone (FLONASE) 50 MCG/ACT nasal spray, Place 2 sprays into both nostrils daily., Disp: 48 g, Rfl: 3   Fluticasone-Umeclidin-Vilant (TRELEGY ELLIPTA) 100-62.5-25 MCG/ACT AEPB, Inhale 1 puff into the lungs daily., Disp: 180 each, Rfl: 1   glucosamine-chondroitin 500-400 MG tablet, Take 1 tablet by mouth 2 (two) times daily., Disp: , Rfl:    hyoscyamine (LEVSIN SL) 0.125 MG SL tablet, Take by mouth every 6 (six) hours as needed for cramping., Disp: , Rfl:    hyoscyamine (LEVSIN/SL) 0.125 MG SL tablet, Take 1 tablet (0.125 mg total) by mouth every 6 (six) hours as needed for abdominal pain., Disp: 30 tablet, Rfl: 1   ipratropium-albuterol (DUONEB) 0.5-2.5 (3) MG/3ML SOLN, Take 3 mLs by nebulization., Disp: , Rfl:    meloxicam (MOBIC) 15 MG tablet, Take 1 tablet (15 mg total) by mouth daily for joint pain., Disp: 90 tablet, Rfl: 1   montelukast (SINGULAIR) 10 MG tablet, Take 1 tablet (10 mg total) by mouth daily., Disp: 90 tablet, Rfl: 3   Multiple Vitamins-Minerals (MULTIVITAL PO), Take 1 tablet by mouth daily., Disp: , Rfl:    omeprazole (PRILOSEC) 20 MG capsule, Take 1 capsule (20 mg total) by mouth daily., Disp: 90 capsule, Rfl: 4   OXYGEN, Take 2 L by mouth at bedtime. Uses with CPAP face mask, Disp: , Rfl:    PARoxetine (PAXIL) 30 MG tablet, Take 2 tablets (60 mg total) by mouth daily., Disp: 180 tablet, Rfl: 1   propranolol ER (INDERAL LA) 80 MG 24 hr capsule, Take 1 capsule (80 mg total) by mouth daily., Disp: 90 capsule, Rfl: 4   Bempedoic Acid 180 MG TABS, Take 1 tablet (180 mg) by mouth every Monday, Wednesday, and Friday., Disp: 45 tablet, Rfl: 3

## 2023-09-21 NOTE — Patient Instructions (Addendum)
We will refer you to our interventional radiology team for evaluation of getting biopsy done of the right lower lung mass  We will schedule you for a PET Scan  Continue augmentin antibiotics, try imodium for the loose stools/diarrhea  We will check CRP and ESR labs today for inflammatory markers  Follow up as scheduled

## 2023-09-22 ENCOUNTER — Other Ambulatory Visit (HOSPITAL_COMMUNITY): Payer: Self-pay

## 2023-09-24 ENCOUNTER — Other Ambulatory Visit (HOSPITAL_COMMUNITY): Payer: Self-pay

## 2023-09-25 ENCOUNTER — Other Ambulatory Visit: Payer: Self-pay

## 2023-09-26 ENCOUNTER — Other Ambulatory Visit: Payer: Self-pay

## 2023-09-26 ENCOUNTER — Other Ambulatory Visit (HOSPITAL_COMMUNITY): Payer: Self-pay

## 2023-09-28 ENCOUNTER — Encounter: Payer: Self-pay | Admitting: Pulmonary Disease

## 2023-09-28 ENCOUNTER — Other Ambulatory Visit (HOSPITAL_COMMUNITY): Payer: Self-pay

## 2023-09-29 ENCOUNTER — Other Ambulatory Visit (HOSPITAL_COMMUNITY): Payer: Self-pay

## 2023-10-03 ENCOUNTER — Other Ambulatory Visit: Payer: Self-pay

## 2023-10-03 ENCOUNTER — Other Ambulatory Visit (HOSPITAL_COMMUNITY): Payer: Self-pay

## 2023-10-03 DIAGNOSIS — R079 Chest pain, unspecified: Secondary | ICD-10-CM | POA: Diagnosis not present

## 2023-10-03 MED ORDER — TRILIPIX 135 MG PO CPDR
135.0000 mg | DELAYED_RELEASE_CAPSULE | Freq: Every day | ORAL | 3 refills | Status: DC
  Filled 2023-10-03: qty 90, 90d supply, fill #0
  Filled 2024-01-17: qty 90, 90d supply, fill #1
  Filled 2024-04-12: qty 90, 90d supply, fill #2
  Filled 2024-07-12: qty 90, 90d supply, fill #3

## 2023-10-04 ENCOUNTER — Other Ambulatory Visit: Payer: Self-pay

## 2023-10-04 DIAGNOSIS — R079 Chest pain, unspecified: Secondary | ICD-10-CM | POA: Diagnosis not present

## 2023-10-05 ENCOUNTER — Other Ambulatory Visit (HOSPITAL_COMMUNITY): Payer: Self-pay

## 2023-10-07 ENCOUNTER — Other Ambulatory Visit (HOSPITAL_COMMUNITY): Payer: Self-pay

## 2023-10-07 NOTE — Telephone Encounter (Signed)
 Dr. Francine Graven, please see mychart message sent and advise.

## 2023-10-10 ENCOUNTER — Other Ambulatory Visit (HOSPITAL_COMMUNITY): Payer: Self-pay

## 2023-10-10 MED ORDER — OMEPRAZOLE 20 MG PO CPDR
20.0000 mg | DELAYED_RELEASE_CAPSULE | Freq: Every day | ORAL | 4 refills | Status: AC
  Filled 2023-10-10: qty 90, 90d supply, fill #0
  Filled 2024-01-17: qty 90, 90d supply, fill #1
  Filled 2024-04-12: qty 90, 90d supply, fill #2
  Filled 2024-07-12: qty 90, 90d supply, fill #3
  Filled 2024-10-07: qty 90, 90d supply, fill #4

## 2023-10-10 MED ORDER — PROPRANOLOL HCL ER 80 MG PO CP24
80.0000 mg | ORAL_CAPSULE | Freq: Every day | ORAL | 4 refills | Status: DC
  Filled 2023-10-10: qty 90, 90d supply, fill #0
  Filled 2024-01-21: qty 90, 90d supply, fill #1
  Filled 2024-04-17: qty 90, 90d supply, fill #2
  Filled 2024-07-16: qty 90, 90d supply, fill #3
  Filled 2024-10-07: qty 90, 90d supply, fill #4

## 2023-10-11 ENCOUNTER — Other Ambulatory Visit: Payer: Self-pay

## 2023-10-17 ENCOUNTER — Ambulatory Visit: Payer: PPO

## 2023-10-17 ENCOUNTER — Other Ambulatory Visit (HOSPITAL_COMMUNITY): Payer: Self-pay

## 2023-10-17 ENCOUNTER — Encounter: Payer: Self-pay | Admitting: Pulmonary Disease

## 2023-10-17 ENCOUNTER — Ambulatory Visit: Payer: PPO | Admitting: Pulmonary Disease

## 2023-10-17 VITALS — BP 129/64 | HR 70 | Ht 70.0 in | Wt 201.4 lb

## 2023-10-17 DIAGNOSIS — J90 Pleural effusion, not elsewhere classified: Secondary | ICD-10-CM

## 2023-10-17 DIAGNOSIS — R918 Other nonspecific abnormal finding of lung field: Secondary | ICD-10-CM

## 2023-10-17 DIAGNOSIS — R9389 Abnormal findings on diagnostic imaging of other specified body structures: Secondary | ICD-10-CM | POA: Diagnosis not present

## 2023-10-17 MED ORDER — AMOXICILLIN-POT CLAVULANATE 875-125 MG PO TABS
1.0000 | ORAL_TABLET | Freq: Two times a day (BID) | ORAL | 0 refills | Status: DC
  Filled 2023-10-17 – 2023-10-24 (×2): qty 60, 30d supply, fill #0

## 2023-10-17 NOTE — Patient Instructions (Addendum)
We will check a chest x- ray today  We will check ESR and CRP labs today  We will schedule you for a CT Chest scan   Continue antibiotics daily until you hear from [place] to stop them  Follow up in 4 weeks

## 2023-10-17 NOTE — Progress Notes (Signed)
Synopsis: Acute visit for hospital follow up  Subjective:   PATIENT ID: Daniel Terrell GENDER: male DOB: 1946/03/03, MRN: [id number]  HPI  Chief Complaint  Patient presents with   Follow-up    Pt complains of sharp pain in right lung. Pain rate 3/10. Augmentin seems to be helping for pain.   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  Initial OV 09/21/23 He was admitted over the weekend to [facility] for progressive chest and back pain. He presented with severe chest pain that began on the 19th of the month. The pain, located in the upper right chest, has progressively worsened, leading to hyperventilation and panic attacks. Accompanying the pain, the patient reported episodes of fever and night sweats, which were more intense than his usual episodes.   The patient was admitted to [facility] due to the severity of the symptoms. During the hospital stay, a CT scan revealed a mass in the lower right lung. The patient was discharged with a prescription for hydrocodone and ibuprofen for pain management and Augmentin plus doxycycline as an antibiotic regimen.   The patient has a history of prostate cancer, treated with a prostatectomy 13 years ago, and has been followed for COPD and sleep apnea. The patient quit smoking in 1972. Prior to the onset of the current symptoms, the patient reported a month-long illness in September, initially suspected to be COVID-19, but tests for COVID and flu were negative. The patient also reported a general lack of energy throughout the year, with a significant decrease in activity levels. Denies dysphagia issues.  The patient uses a CPAP machine for sleep apnea, but has been unable to use it recently due to the chest pain. The patient reported that the CPAP machine's water canister is not sealing properly and has not been regularly cleaning the tubing.    Today OV 10/17/23 Patient reports slight improvement in his symptoms since last visit. He reports less back pain. He denies fevers, chills or weight loss. He has been taking his augmentin twice daily.    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   Erectile dysfunction due to arterial insufficiency 11/09/2020   Essential hypertension 12/25/2020   Essential tremor    Family history of breast cancer    Family history of colon cancer    Fracture of shaft of metacarpal bone 07/25/2022   GERD (gastroesophageal reflux disease)    Hypertriglyceridemia    IBS (irritable bowel syndrome)    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   Nocturnal hypoxemia 10/03/2022   uses O2 at night for COPD   Obstructive sleep apnea 01/06/2009   NPSG 01/12/03 AHI 42/ hr, loud snore, weight 200 lbs  Could not get comfortable with CPAP      OCD (obsessive compulsive disorder)    Pain in right hand 07/25/2022   PONV (postoperative nausea and vomiting)    Prostate  cancer (HCC)    Pure hypercholesterolemia 04/30/2009   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   Restless legs syndrome (RLS) 11/28/2013   Seasonal and perennial allergic rhinitis 01/06/2009     Family History  Problem Relation Age of Onset   Colon polyps Mother    Tremor Mother    Breast cancer Mother 59   Memory loss Mother        early 48s   Heart attack Father    Alcohol abuse Father    Heart disease Father    Breast cancer Maternal Grandmother    Dementia Maternal Grandmother    Colon cancer Maternal Grandfather     Esophageal cancer Neg Hx    Stomach cancer Neg Hx    Rectal cancer Neg Hx      Social History   Socioeconomic History   Marital status: Married    Spouse name: Alvino Chapel   Number of children: 3   Years of education: 16   Highest education level: Bachelor's degree (e.g., BA, AB, BS)  Occupational History   Occupation: Retired    Comment: Education officer, environmental  Tobacco Use   Smoking status: Former    Current packs/day: 0.00    Types: Cigarettes    Quit date: 09/26/1970    Years since quitting: 53.1   Smokeless tobacco: Never  Vaping Use   Vaping status: Never Used  Substance and Sexual Activity   Alcohol use: No   Drug use: No   Sexual activity: Not on file  Other Topics Concern   Not on file  Social History Narrative   Patient is married with 3 children.   Patient is right handed.   Patient has a Bachelor's degree.   Patient drinks 2-3 cups daily.      Social Drivers of Corporate investment banker Strain: Not on file  Food Insecurity: Not on file  Transportation Needs: Not on file  Physical Activity: Not on file  Stress: Not on file  Social Connections: Not on file  Intimate Partner Violence: Not on file     Allergies  Allergen Reactions   Levofloxacin     REACTION: disorientation  Other Reaction(s): confusion   Moxifloxacin     Other Reaction(s): confusion   Ofloxacin     REACTION: disorientation  Other Reaction(s): confusion  Other Reaction(s): confusion, confusion, confusion   Rosuvastatin     Other Reaction(s): myalgias (muscle pain)   Topiramate Other (See Comments)    drowsy     No facility-administered medications prior to visit.   Outpatient Medications Prior to Visit  Medication Sig Dispense Refill   albuterol (PROVENTIL HFA;VENTOLIN HFA) 108 (90 BASE) MCG/ACT inhaler Inhale 1-2 puffs into the lungs every 6 (six) hours as needed for wheezing or shortness of breath. (Patient not taking: Reported on 10/28/2023)     amLODipine (NORVASC) 5 MG tablet Take 1  tablet (5 mg total) by mouth daily for blood pressure. 90 tablet 1   Armodafinil 250 MG tablet Take 0.5-1 tablets (125-250 mg total) by mouth daily as needed for sleepiness. (Patient not taking: Reported on 10/28/2023) 90 tablet 1   aspirin 81 MG tablet Take 81 mg by mouth daily.     cetirizine (ZYRTEC) 10 MG tablet Take 10 mg by mouth daily.     Choline Fenofibrate (TRILIPIX) 135 MG capsule Take 1 capsule (135 mg total) by mouth daily for cholesterol 90 capsule 3   fluticasone (FLONASE) 50 MCG/ACT nasal spray Place 2 sprays into both nostrils daily. (Patient  taking differently: Place 2 sprays into both nostrils in the morning.) 48 g 3   Fluticasone-Umeclidin-Vilant (TRELEGY ELLIPTA) 100-62.5-25 MCG/ACT AEPB Inhale 1 puff into the lungs daily. (Patient taking differently: Inhale 1 puff into the lungs in the morning.) 180 each 1   glucosamine-chondroitin 500-400 MG tablet Take 1 tablet by mouth 2 (two) times daily.     hyoscyamine (LEVSIN/SL) 0.125 MG SL tablet Take 1 tablet (0.125 mg total) by mouth every 6 (six) hours as needed for abdominal pain. (Patient not taking: Reported on 10/28/2023) 30 tablet 1   ipratropium-albuterol (DUONEB) 0.5-2.5 (3) MG/3ML SOLN Take 3 mLs by nebulization.     meloxicam (MOBIC) 15 MG tablet Take 1 tablet (15 mg total) by mouth daily for joint pain. (Patient not taking: Reported on 10/28/2023) 90 tablet 1   Multiple Vitamins-Minerals (MULTIVITAL PO) Take 1 tablet by mouth daily.     omeprazole (PRILOSEC) 20 MG capsule Take 1 capsule (20 mg total) by mouth daily. (Patient taking differently: Take 20 mg by mouth at bedtime.) 90 capsule 4   OXYGEN Take 2 L by mouth at bedtime. Uses with CPAP face mask and then as needed during the daytime.     PARoxetine (PAXIL) 30 MG tablet Take 2 tablets (60 mg total) by mouth daily. 180 tablet 1   propranolol ER (INDERAL LA) 80 MG 24 hr capsule Take 1 capsule (80 mg total) by mouth daily. (Patient taking differently: Take 80 mg by mouth daily.  For his Tremors) 90 capsule 4   VITAMIN D PO Take 5,000 Units by mouth daily.     amoxicillin-clavulanate (AUGMENTIN) 875-125 MG tablet Take 1 tablet by mouth 2 (two) times daily. 60 tablet 0   hyoscyamine (LEVSIN SL) 0.125 MG SL tablet Take by mouth every 6 (six) hours as needed for cramping.     montelukast (SINGULAIR) 10 MG tablet Take 1 tablet (10 mg total) by mouth daily. 90 tablet 3    Review of Systems  Constitutional:  Negative for chills, fever, malaise/fatigue and weight loss.  HENT:  Negative for congestion, sinus pain and sore throat.   Eyes: Negative.   Respiratory:  Positive for cough and shortness of breath. Negative for hemoptysis, sputum production and wheezing.   Cardiovascular:  Negative for chest pain, palpitations, orthopnea, claudication and leg swelling.  Gastrointestinal:  Negative for abdominal pain, heartburn, nausea and vomiting.  Genitourinary: Negative.   Musculoskeletal:  Negative for back pain, joint pain and myalgias.  Skin:  Negative for rash.  Neurological:  Negative for weakness.  Endo/Heme/Allergies: Negative.   Psychiatric/Behavioral: Negative.        Objective:   Vitals:   10/17/23 1617  BP: 129/64  Pulse: 70  SpO2: 93%  Weight: 201 lb 6.4 oz (91.4 kg)  Height: 5\' 10"  (1.778 m)     Physical Exam Constitutional:      General: He is not in acute distress.    Appearance: Normal appearance.  Eyes:     General: No scleral icterus.    Conjunctiva/sclera: Conjunctivae normal.  Cardiovascular:     Rate and Rhythm: Normal rate and regular rhythm.  Pulmonary:     Breath sounds: No wheezing, rhonchi or rales.  Musculoskeletal:     Right lower leg: No edema.     Left lower leg: No edema.  Skin:    General: Skin is warm and dry.  Neurological:     General: No focal deficit present.    CBC    Component Value Date/Time  WBC 4.3 10/28/2023 0538   RBC 3.87 (L) 10/28/2023 0538   HGB 11.9 (L) 10/28/2023 0538   HCT 34.9 (L) 10/28/2023  0538   PLT 225 10/28/2023 0538   MCV 90.2 10/28/2023 0538   MCH 30.7 10/28/2023 0538   MCHC 34.1 10/28/2023 0538   RDW 14.6 10/28/2023 0538   LYMPHSABS 1.1 10/27/2023 1249   MONOABS 0.8 10/27/2023 1249   EOSABS 0.1 10/27/2023 1249   BASOSABS 0.0 10/27/2023 1249      Latest Ref Rng & Units 10/28/2023    5:38 AM 10/27/2023   12:49 PM 12/31/2020   10:06 AM  BMP  Glucose 70 - 99 mg/dL 83  99  84   BUN 8 - 23 mg/dL 21  29  28    Creatinine 0.61 - 1.24 mg/dL 4.69  6.29  5.28   BUN/Creat Ratio 10 - 24   26   Sodium 135 - 145 mmol/L 136  138  138   Potassium 3.5 - 5.1 mmol/L 3.7  4.1  4.7   Chloride 98 - 111 mmol/L 106  103  103   CO2 22 - 32 mmol/L 22  25  22    Calcium 8.9 - 10.3 mg/dL 8.4  9.8  9.2    Chest imaging: CXR 06/02/22 The lungs are clear. Heart size is normal. No pneumothorax or pleural fluid. No acute or focal bony abnormality. Thoracic and lumbar degenerative change noted.  Cardiac Coronary CT Scan 01/05/21 Mediastinum/Nodes: No imaged thoracic adenopathy.   Lungs/Pleura: No pleural fluid. Lingular scarring or subsegmental atelectasis.   PFT:    Latest Ref Rng & Units 07/11/2022   10:42 AM  PFT Results  FVC-Pre L 4.03   FVC-Predicted Pre % 94   FVC-Post L 4.25   FVC-Predicted Post % 99   Pre FEV1/FVC % % 78   Post FEV1/FCV % % 79   FEV1-Pre L 3.16   FEV1-Predicted Pre % 101   FEV1-Post L 3.36   DLCO uncorrected ml/min/mmHg 23.52   DLCO UNC% % 93   DLCO corrected ml/min/mmHg 23.52   DLCO COR %Predicted % 93   DLVA Predicted % 94   TLC L 6.77   TLC % Predicted % 96   RV % Predicted % 97     Labs:  Path:  Echo:  Heart Catheterization:    Assessment & Plan:   Pleural effusion on right - Plan: DG Chest 2 View, C-reactive protein, Sed Rate (ESR), CANCELED: CT Chest Wo Contrast, CANCELED: C-reactive protein, CANCELED: Sed Rate (ESR), CANCELED: Sed Rate (ESR), CANCELED: C-reactive protein  Right lower lobe lung mass - Plan: DG Chest 2 View,  amoxicillin-clavulanate (AUGMENTIN) 875-125 MG tablet, CANCELED: CT Chest Wo Contrast  Discussion: Gyasi Hazzard is a 78 year old male, former smoker with COPD and OSA on CPAP who returns to pulmonary clinic for right lower lobe mass vs abscess.  Right Lower Lung Mass vs Abscess He has been on extended course of augmentin. Elevated ESR and CRP in the past.  -Repeat ESR/CRP today - Check chest x-ray and schedule patient for follow up CT Chest scan - continue augmentin until further direction based on imaging.  Pleural Effusion  CT scan also revealed loculated pleural effusion, which could be contributing to the patient's pain.  -Monitor with follow up CT chest, continue abx as above  Follow up in 1 month, call sooner if needed  Melody Comas, MD [facility] Office: [phone]  No current facility-administered medications for this  visit. No current outpatient medications on file.  Facility-Administered Medications Ordered in Other Visits:    acetaminophen (TYLENOL) tablet 650 mg, 650 mg, Oral, Q6H PRN **OR** acetaminophen (TYLENOL) suppository 650 mg, 650 mg, Rectal, Q6H PRN, Nolberto Hanlon, MD   albuterol (PROVENTIL) (2.5 MG/3ML) 0.083% nebulizer solution 2.5 mg, 2.5 mg, Nebulization, TID, Marinda Elk, MD, 2.5 mg at 10/28/23 2017   amoxicillin-clavulanate (AUGMENTIN) 875-125 MG per tablet 1 tablet, 1 tablet, Oral, Q12H, Marinda Elk, MD, 1 tablet at 10/28/23 1610   aspirin EC tablet 81 mg, 81 mg, Oral, Daily, Marinda Elk, MD, 81 mg at 10/28/23 9604   doxycycline (VIBRA-TABS) tablet 100 mg, 100 mg, Oral, Q12H, Marinda Elk, MD, 100 mg at 10/28/23 0952   enoxaparin (LOVENOX) injection 40 mg, 40 mg, Subcutaneous, Q24H, Nolberto Hanlon, MD, 40 mg at 10/28/23 0832   fluticasone furoate-vilanterol (BREO ELLIPTA) 100-25 MCG/ACT 1 puff, 1 puff, Inhalation, Daily, 1 puff at 10/28/23 0814 **AND** umeclidinium bromide (INCRUSE ELLIPTA) 62.5  MCG/ACT 1 puff, 1 puff, Inhalation, Daily, Marinda Elk, MD   ibuprofen (ADVIL) tablet 600 mg, 600 mg, Oral, Q6H PRN, Marinda Elk, MD, 600 mg at 10/28/23 1831   montelukast (SINGULAIR) chewable tablet 10 mg, 10 mg, Oral, QHS, Nolberto Hanlon, MD, 10 mg at 10/27/23 2312   oseltamivir (TAMIFLU) capsule 75 mg, 75 mg, Oral, BID, Nolberto Hanlon, MD, 75 mg at 10/28/23 0953   pantoprazole (PROTONIX) EC tablet 40 mg, 40 mg, Oral, Daily, Nolberto Hanlon, MD, 40 mg at 10/28/23 5409   PARoxetine (PAXIL) tablet 60 mg, 60 mg, Oral, Daily, Nolberto Hanlon, MD, 60 mg at 10/28/23 8119   polyethylene glycol (MIRALAX / GLYCOLAX) packet 17 g, 17 g, Oral, Daily, Marinda Elk, MD   propranolol ER (INDERAL LA) 24 hr capsule 60 mg, 60 mg, Oral, Daily, Marinda Elk, MD   sodium chloride flush (NS) 0.9 % injection 3 mL, 3 mL, Intravenous, Q12H, Nolberto Hanlon, MD, 3 mL at 10/28/23 [phone]

## 2023-10-18 ENCOUNTER — Other Ambulatory Visit: Payer: Self-pay | Admitting: Pulmonary Disease

## 2023-10-18 ENCOUNTER — Other Ambulatory Visit: Payer: Self-pay

## 2023-10-18 ENCOUNTER — Other Ambulatory Visit (HOSPITAL_COMMUNITY): Payer: Self-pay

## 2023-10-18 ENCOUNTER — Telehealth: Payer: Self-pay | Admitting: Pulmonary Disease

## 2023-10-18 DIAGNOSIS — J90 Pleural effusion, not elsewhere classified: Secondary | ICD-10-CM | POA: Diagnosis not present

## 2023-10-18 NOTE — Telephone Encounter (Signed)
PT calling about blood work order put in yesterday. He needs to discuss changing where he needs to go to have BW taken.   Preference:  [facility] in [place]   [phone] is his #

## 2023-10-19 ENCOUNTER — Encounter: Payer: Self-pay | Admitting: Pulmonary Disease

## 2023-10-19 LAB — HIGH SENSITIVITY CRP: CRP, High Sensitivity: 7.4 mg/L — ABNORMAL HIGH (ref 0.00–3.00)

## 2023-10-19 NOTE — Telephone Encounter (Signed)
I called pt's spouse, Alvino Chapel, [place] per DPR  There was no answer- LMTCB

## 2023-10-19 NOTE — Telephone Encounter (Signed)
Alvino Chapel wife checking on patient message about labwork. Alvino Chapel phone number is [phone].

## 2023-10-20 ENCOUNTER — Other Ambulatory Visit: Payer: Self-pay | Admitting: Pulmonary Disease

## 2023-10-20 DIAGNOSIS — J90 Pleural effusion, not elsewhere classified: Secondary | ICD-10-CM | POA: Diagnosis not present

## 2023-10-20 NOTE — Telephone Encounter (Signed)
Patient checking on message about labwork. Patient phone number is [phone].

## 2023-10-20 NOTE — Telephone Encounter (Signed)
I called and spoke with patient, he advised that he went to [facility] in [place] on [address]  Not all of his labs were drawn, he was wondering if he needed to have his blood drawn again.  I advised him that since all the labs were not drawn I would print out the requisitions and fax them to the [facility] in [facility].  I called [facility] and verified the contact information is:  Phone # is [phone], fax # is [phone].  Lab requisitions printed and faxed.  Received confirmation that fax was sent successfully.

## 2023-10-21 LAB — SEDIMENTATION RATE: Sed Rate: 2 mm/h (ref 0–30)

## 2023-10-21 LAB — C-REACTIVE PROTEIN: CRP: 4 mg/L (ref 0–10)

## 2023-10-23 ENCOUNTER — Encounter: Payer: Self-pay | Admitting: Pulmonary Disease

## 2023-10-23 NOTE — Addendum Note (Signed)
Addended by: Melody Comas on: 10/23/2023 06:19 PM   Modules accepted: Orders

## 2023-10-24 ENCOUNTER — Other Ambulatory Visit: Payer: Self-pay

## 2023-10-24 NOTE — Telephone Encounter (Signed)
Answered by Dr. Francine Graven directly, nothing further needed.

## 2023-10-25 ENCOUNTER — Encounter: Payer: Self-pay | Admitting: Pulmonary Disease

## 2023-10-27 ENCOUNTER — Emergency Department (HOSPITAL_BASED_OUTPATIENT_CLINIC_OR_DEPARTMENT_OTHER): Payer: PPO

## 2023-10-27 ENCOUNTER — Other Ambulatory Visit: Payer: Self-pay

## 2023-10-27 ENCOUNTER — Inpatient Hospital Stay (HOSPITAL_BASED_OUTPATIENT_CLINIC_OR_DEPARTMENT_OTHER)
Admission: EM | Admit: 2023-10-27 | Discharge: 2023-10-30 | DRG: 193 | Disposition: A | Discharge status: Home / Self Care | Payer: PPO | Source: Ambulatory Visit | Attending: Internal Medicine | Admitting: Internal Medicine

## 2023-10-27 ENCOUNTER — Encounter (HOSPITAL_BASED_OUTPATIENT_CLINIC_OR_DEPARTMENT_OTHER): Payer: Self-pay

## 2023-10-27 ENCOUNTER — Ambulatory Visit (HOSPITAL_BASED_OUTPATIENT_CLINIC_OR_DEPARTMENT_OTHER)
Admission: EM | Admit: 2023-10-27 | Discharge: 2023-10-27 | Disposition: A | Discharge status: Home / Self Care | Payer: PPO | Attending: Family Medicine | Admitting: Family Medicine

## 2023-10-27 ENCOUNTER — Encounter (HOSPITAL_BASED_OUTPATIENT_CLINIC_OR_DEPARTMENT_OTHER): Payer: Self-pay | Admitting: Emergency Medicine

## 2023-10-27 ENCOUNTER — Other Ambulatory Visit (HOSPITAL_COMMUNITY): Payer: Self-pay

## 2023-10-27 DIAGNOSIS — J101 Influenza due to other identified influenza virus with other respiratory manifestations: Secondary | ICD-10-CM | POA: Diagnosis present

## 2023-10-27 DIAGNOSIS — I7121 Aneurysm of the ascending aorta, without rupture: Secondary | ICD-10-CM | POA: Diagnosis present

## 2023-10-27 DIAGNOSIS — J9601 Acute respiratory failure with hypoxia: Secondary | ICD-10-CM | POA: Diagnosis present

## 2023-10-27 DIAGNOSIS — Z8249 Family history of ischemic heart disease and other diseases of the circulatory system: Secondary | ICD-10-CM

## 2023-10-27 DIAGNOSIS — G4733 Obstructive sleep apnea (adult) (pediatric): Secondary | ICD-10-CM | POA: Diagnosis present

## 2023-10-27 DIAGNOSIS — G250 Essential tremor: Secondary | ICD-10-CM | POA: Diagnosis present

## 2023-10-27 DIAGNOSIS — Z79899 Other long term (current) drug therapy: Secondary | ICD-10-CM

## 2023-10-27 DIAGNOSIS — J90 Pleural effusion, not elsewhere classified: Secondary | ICD-10-CM | POA: Diagnosis present

## 2023-10-27 DIAGNOSIS — E7800 Pure hypercholesterolemia, unspecified: Secondary | ICD-10-CM | POA: Diagnosis present

## 2023-10-27 DIAGNOSIS — R935 Abnormal findings on diagnostic imaging of other abdominal regions, including retroperitoneum: Secondary | ICD-10-CM | POA: Diagnosis not present

## 2023-10-27 DIAGNOSIS — R911 Solitary pulmonary nodule: Secondary | ICD-10-CM | POA: Diagnosis present

## 2023-10-27 DIAGNOSIS — G2581 Restless legs syndrome: Secondary | ICD-10-CM | POA: Diagnosis present

## 2023-10-27 DIAGNOSIS — Z881 Allergy status to other antibiotic agents status: Secondary | ICD-10-CM

## 2023-10-27 DIAGNOSIS — Z7982 Long term (current) use of aspirin: Secondary | ICD-10-CM

## 2023-10-27 DIAGNOSIS — Z803 Family history of malignant neoplasm of breast: Secondary | ICD-10-CM

## 2023-10-27 DIAGNOSIS — Z1152 Encounter for screening for COVID-19: Secondary | ICD-10-CM

## 2023-10-27 DIAGNOSIS — I2510 Atherosclerotic heart disease of native coronary artery without angina pectoris: Secondary | ICD-10-CM | POA: Diagnosis not present

## 2023-10-27 DIAGNOSIS — Z880 Allergy status to penicillin: Secondary | ICD-10-CM

## 2023-10-27 DIAGNOSIS — Z791 Long term (current) use of non-steroidal anti-inflammatories (NSAID): Secondary | ICD-10-CM

## 2023-10-27 DIAGNOSIS — Z888 Allergy status to other drugs, medicaments and biological substances status: Secondary | ICD-10-CM

## 2023-10-27 DIAGNOSIS — J851 Abscess of lung with pneumonia: Secondary | ICD-10-CM | POA: Diagnosis present

## 2023-10-27 DIAGNOSIS — M1990 Unspecified osteoarthritis, unspecified site: Secondary | ICD-10-CM | POA: Diagnosis present

## 2023-10-27 DIAGNOSIS — R0602 Shortness of breath: Secondary | ICD-10-CM | POA: Diagnosis not present

## 2023-10-27 DIAGNOSIS — D649 Anemia, unspecified: Secondary | ICD-10-CM | POA: Diagnosis present

## 2023-10-27 DIAGNOSIS — J111 Influenza due to unidentified influenza virus with other respiratory manifestations: Secondary | ICD-10-CM | POA: Diagnosis not present

## 2023-10-27 DIAGNOSIS — R918 Other nonspecific abnormal finding of lung field: Secondary | ICD-10-CM | POA: Diagnosis not present

## 2023-10-27 DIAGNOSIS — R509 Fever, unspecified: Secondary | ICD-10-CM

## 2023-10-27 DIAGNOSIS — I10 Essential (primary) hypertension: Secondary | ICD-10-CM | POA: Diagnosis present

## 2023-10-27 DIAGNOSIS — J440 Chronic obstructive pulmonary disease with acute lower respiratory infection: Secondary | ICD-10-CM | POA: Diagnosis present

## 2023-10-27 DIAGNOSIS — J1000 Influenza due to other identified influenza virus with unspecified type of pneumonia: Principal | ICD-10-CM | POA: Diagnosis present

## 2023-10-27 DIAGNOSIS — Z87891 Personal history of nicotine dependence: Secondary | ICD-10-CM

## 2023-10-27 DIAGNOSIS — Z800 Family history of malignant neoplasm of digestive organs: Secondary | ICD-10-CM

## 2023-10-27 DIAGNOSIS — Z860100 Personal history of colon polyps, unspecified: Secondary | ICD-10-CM

## 2023-10-27 DIAGNOSIS — E781 Pure hyperglyceridemia: Secondary | ICD-10-CM | POA: Diagnosis present

## 2023-10-27 HISTORY — DX: Influenza due to other identified influenza virus with other respiratory manifestations: J10.1

## 2023-10-27 LAB — CBC WITH DIFFERENTIAL/PLATELET
Abs Immature Granulocytes: 0.05 10*3/uL (ref 0.00–0.07)
Basophils Absolute: 0 10*3/uL (ref 0.0–0.1)
Basophils Relative: 0 %
Eosinophils Absolute: 0.1 10*3/uL (ref 0.0–0.5)
Eosinophils Relative: 1 %
HCT: 46.3 % (ref 39.0–52.0)
Hemoglobin: 15.5 g/dL (ref 13.0–17.0)
Immature Granulocytes: 1 %
Lymphocytes Relative: 12 %
Lymphs Abs: 1.1 10*3/uL (ref 0.7–4.0)
MCH: 30.1 pg (ref 26.0–34.0)
MCHC: 33.5 g/dL (ref 30.0–36.0)
MCV: 89.9 fL (ref 80.0–100.0)
Monocytes Absolute: 0.8 10*3/uL (ref 0.1–1.0)
Monocytes Relative: 9 %
Neutro Abs: 7.4 10*3/uL (ref 1.7–7.7)
Neutrophils Relative %: 77 %
Platelets: 254 10*3/uL (ref 150–400)
RBC: 5.15 MIL/uL (ref 4.22–5.81)
RDW: 14.3 % (ref 11.5–15.5)
WBC: 9.1 10*3/uL (ref 4.0–10.5)
nRBC: 0 % (ref 0.0–0.2)

## 2023-10-27 LAB — COMPREHENSIVE METABOLIC PANEL
ALT: 20 U/L (ref 0–44)
AST: 23 U/L (ref 15–41)
Albumin: 4.1 g/dL (ref 3.5–5.0)
Alkaline Phosphatase: 54 U/L (ref 38–126)
Anion gap: 10 (ref 5–15)
BUN: 29 mg/dL — ABNORMAL HIGH (ref 8–23)
CO2: 25 mmol/L (ref 22–32)
Calcium: 9.8 mg/dL (ref 8.9–10.3)
Chloride: 103 mmol/L (ref 98–111)
Creatinine, Ser: 1.17 mg/dL (ref 0.61–1.24)
GFR, Estimated: >60 mL/min (ref >60)
Glucose, Bld: 99 mg/dL (ref 70–99)
Potassium: 4.1 mmol/L (ref 3.5–5.1)
Sodium: 138 mmol/L (ref 135–145)
Total Bilirubin: 0.7 mg/dL (ref 0.0–1.2)
Total Protein: 7.5 g/dL (ref 6.5–8.1)

## 2023-10-27 LAB — APTT: aPTT: 33 s (ref 24–36)

## 2023-10-27 LAB — RESP PANEL BY RT-PCR (RSV, FLU A&B, COVID)  RVPGX2
Influenza A by PCR: POSITIVE — AB
Influenza B by PCR: NEGATIVE
Resp Syncytial Virus by PCR: NEGATIVE
SARS Coronavirus 2 by RT PCR: NEGATIVE

## 2023-10-27 LAB — LACTIC ACID, PLASMA
Lactic Acid, Venous: 0.9 mmol/L (ref 0.5–1.9)
Lactic Acid, Venous: 0.9 mmol/L (ref 0.5–1.9)

## 2023-10-27 LAB — PROTIME-INR
INR: 1 (ref 0.8–1.2)
Prothrombin Time: 13.6 s (ref 11.4–15.2)

## 2023-10-27 MED ORDER — ACETAMINOPHEN 650 MG RE SUPP
650.0000 mg | Freq: Four times a day (QID) | RECTAL | Status: DC | PRN
Start: 2023-10-27 — End: 2023-10-30

## 2023-10-27 MED ORDER — POLYETHYLENE GLYCOL 3350 17 G PO PACK: 17.0000 g | PACK | Freq: Every day | ORAL | Status: DC | PRN

## 2023-10-27 MED ORDER — LACTATED RINGERS IV SOLN: INTRAVENOUS | Status: AC

## 2023-10-27 MED ORDER — IPRATROPIUM-ALBUTEROL 0.5-2.5 (3) MG/3ML IN SOLN
3.0000 mL | Freq: Once | RESPIRATORY_TRACT | Status: AC
  Administered 2023-10-27: 3 mL via RESPIRATORY_TRACT
  Filled 2023-10-27: qty 3

## 2023-10-27 MED ORDER — IPRATROPIUM BROMIDE 0.02 % IN SOLN
0.5000 mg | RESPIRATORY_TRACT | Status: DC
  Administered 2023-10-27 – 2023-10-28 (×2): 0.5 mg via RESPIRATORY_TRACT
  Filled 2023-10-27 (×2): qty 2.5

## 2023-10-27 MED ORDER — SODIUM CHLORIDE 0.9% FLUSH
3.0000 mL | Freq: Two times a day (BID) | INTRAVENOUS | Status: DC
  Administered 2023-10-27 – 2023-10-30 (×6): 3 mL via INTRAVENOUS

## 2023-10-27 MED ORDER — IOHEXOL 350 MG/ML SOLN
75.0000 mL | Freq: Once | INTRAVENOUS | Status: AC | PRN
  Administered 2023-10-27: 75 mL via INTRAVENOUS

## 2023-10-27 MED ORDER — VANCOMYCIN HCL IN DEXTROSE 1-5 GM/200ML-% IV SOLN
1000.0000 mg | Freq: Once | INTRAVENOUS | Status: AC
  Administered 2023-10-27: 1000 mg via INTRAVENOUS
  Filled 2023-10-27: qty 200

## 2023-10-27 MED ORDER — ASPIRIN 81 MG PO CHEW: 81.0000 mg | CHEWABLE_TABLET | Freq: Every day | ORAL | Status: DC

## 2023-10-27 MED ORDER — IPRATROPIUM BROMIDE HFA 17 MCG/ACT IN AERS: 2.0000 | INHALATION_SPRAY | RESPIRATORY_TRACT | Status: DC

## 2023-10-27 MED ORDER — LACTATED RINGERS IV BOLUS (SEPSIS)
1000.0000 mL | Freq: Once | INTRAVENOUS | Status: AC
  Administered 2023-10-27: 1000 mL via INTRAVENOUS

## 2023-10-27 MED ORDER — ENOXAPARIN SODIUM 40 MG/0.4ML IJ SOSY
40.0000 mg | PREFILLED_SYRINGE | INTRAMUSCULAR | Status: DC
Start: 2023-10-28 — End: 2023-10-30
  Administered 2023-10-28 – 2023-10-30 (×3): 40 mg via SUBCUTANEOUS
  Filled 2023-10-27 (×3): qty 0.4

## 2023-10-27 MED ORDER — MONTELUKAST SODIUM 5 MG PO CHEW
10.0000 mg | CHEWABLE_TABLET | Freq: Every day | ORAL | Status: DC
Start: 2023-10-27 — End: 2023-10-30
  Administered 2023-10-27 – 2023-10-29 (×3): 10 mg via ORAL
  Filled 2023-10-27 (×3): qty 2

## 2023-10-27 MED ORDER — FLUTICASONE FUROATE-VILANTEROL 200-25 MCG/ACT IN AEPB
1.0000 | INHALATION_SPRAY | Freq: Every day | RESPIRATORY_TRACT | Status: DC
  Filled 2023-10-27: qty 28

## 2023-10-27 MED ORDER — MONTELUKAST SODIUM 10 MG PO TABS
10.0000 mg | ORAL_TABLET | Freq: Every day | ORAL | 3 refills | Status: DC
  Filled 2023-10-27: qty 90, 90d supply, fill #0
  Filled 2024-01-26: qty 90, 90d supply, fill #1
  Filled 2024-04-26: qty 90, 90d supply, fill #2
  Filled 2024-07-25: qty 90, 90d supply, fill #3

## 2023-10-27 MED ORDER — PANTOPRAZOLE SODIUM 40 MG PO TBEC
40.0000 mg | DELAYED_RELEASE_TABLET | Freq: Every day | ORAL | Status: DC
  Administered 2023-10-28 – 2023-10-30 (×3): 40 mg via ORAL
  Filled 2023-10-27 (×3): qty 1

## 2023-10-27 MED ORDER — SODIUM CHLORIDE 0.9 % IV SOLN
100.0000 mg | Freq: Two times a day (BID) | INTRAVENOUS | Status: DC
  Filled 2023-10-27: qty 100

## 2023-10-27 MED ORDER — CEFTRIAXONE SODIUM 2 G IJ SOLR
2.0000 g | Freq: Once | INTRAMUSCULAR | Status: AC
  Administered 2023-10-27: 2 g via INTRAVENOUS
  Filled 2023-10-27: qty 20

## 2023-10-27 MED ORDER — OSELTAMIVIR PHOSPHATE 75 MG PO CAPS
75.0000 mg | ORAL_CAPSULE | Freq: Two times a day (BID) | ORAL | Status: DC
  Administered 2023-10-27 – 2023-10-30 (×6): 75 mg via ORAL
  Filled 2023-10-27 (×6): qty 1

## 2023-10-27 MED ORDER — PAROXETINE HCL 20 MG PO TABS
60.0000 mg | ORAL_TABLET | Freq: Every day | ORAL | Status: DC
  Administered 2023-10-28 – 2023-10-30 (×3): 60 mg via ORAL
  Filled 2023-10-27 (×3): qty 3

## 2023-10-27 MED ORDER — ALBUTEROL SULFATE (2.5 MG/3ML) 0.083% IN NEBU: 2.5000 mg | INHALATION_SOLUTION | RESPIRATORY_TRACT | Status: DC | PRN

## 2023-10-27 MED ORDER — SODIUM CHLORIDE 0.9 % IV SOLN
500.0000 mg | Freq: Once | INTRAVENOUS | Status: AC
  Administered 2023-10-27: 500 mg via INTRAVENOUS
  Filled 2023-10-27: qty 5

## 2023-10-27 MED ORDER — SODIUM CHLORIDE 0.9 % IV SOLN: 2.0000 g | INTRAVENOUS | Status: DC

## 2023-10-27 MED ORDER — ACETAMINOPHEN 325 MG PO TABS: 650.0000 mg | ORAL_TABLET | Freq: Four times a day (QID) | ORAL | Status: DC | PRN

## 2023-10-27 NOTE — ED Notes (Signed)
Report to [facility] for [id number]

## 2023-10-27 NOTE — Assessment & Plan Note (Addendum)
Patient has been on chronic suppressive antibiotic therapy with Augmentin for last several weeks for prior known lung "masses ".  In spite of this presents with new onset of fever cough and expectoration over the last 3 days found to be influenza A positive.  Also found to be hypoxic requiring 2 L/min of supplementary oxygen newly.  Will treat with oseltamivir.  Given green expectoration by patient as well as rigors at home, consideration include possible MRSA infection.  Therefore patient was treated with vancomycin and ceftriaxone and azithromycin already in the ER.  Will continue treatment with doxycycline and ceftriaxone.  Blood cultures are pending  Patient has prior diagnosis of COPD, at this time does not appear to have marked COPD exacerbation.  I will treat the patient with inhaled anticholinergic steroids and long-acting beta adrenergic's.  Resume Augmentin as per pulmonary at the time of discharge

## 2023-10-27 NOTE — Sepsis Progress Note (Signed)
 Sepsis protocol monitored by eLink ?

## 2023-10-27 NOTE — ED Notes (Signed)
Pt back from CT and hooked up to all fluids again and monitor

## 2023-10-27 NOTE — ED Triage Notes (Addendum)
Pt presents wit SOB, chills/cold sweats, fever, fatigue, and sore throat. Temp this AM was 101.7, was 88 on RA (wears 2L [place] at night and PRN). Son reports pt had fall this AM s/t disorientation, as witnessed by wife. Denies hitting head. On 81 mg ASA. Pt is A&Ox4 at present. Has been on Augmentin since December per pulmonology d/t R lung mass. Self-started Doxycycline back this week d/t symptoms.  Pt placed on 2L [place] d/t SOB and O2 sat of 88 on RA. Cam Hai, MD notitied.

## 2023-10-27 NOTE — Assessment & Plan Note (Signed)
C/w montelukast

## 2023-10-27 NOTE — Telephone Encounter (Signed)
Patient was seen in the UC today but advised to proceed to the ED.

## 2023-10-27 NOTE — ED Notes (Addendum)
Patient is being discharged from the Urgent Care and sent to the Emergency Department via POV with son . Per P. Banister, MD, patient is in need of higher level of care due to severity of symptoms. Patient is aware and verbalizes understanding of plan of care.  Vitals:   10/27/23 1136  BP: 106/70  Pulse: 89  Resp: (!) 26  Temp: 98 F (36.7 C)  SpO2: (!) 88%

## 2023-10-27 NOTE — ED Provider Notes (Addendum)
3:02 PM Patient signed out to me by previous ED physician. Pt is a 78 yo male with previous loculated pleural effusion presenting for worsening of symptoms. Flu positive here. On 2L [place] here due to hypoxia. Has home oxygen but usually only wears PRN nightly. 88% on RA at urgent care visit prior to arrival.   Plan: admit to hospitalist team  Physical Exam  Ht 5\' 10"  (1.778 m)   Wt 91.4 kg   BMI 28.91 kg/m   Physical Exam  Procedures  .Critical Care  Performed by: Franne Forts, DO Authorized by: Franne Forts, DO   Critical care provider statement:    Critical care time (minutes):  30   Critical care was necessary to treat or prevent imminent or life-threatening deterioration of the following conditions:  Respiratory failure   Critical care was time spent personally by me on the following activities:  Development of treatment plan with patient or surrogate, discussions with consultants, evaluation of patient's response to treatment, examination of patient, ordering and review of laboratory studies, ordering and review of radiographic studies, ordering and performing treatments and interventions, pulse oximetry, re-evaluation of patient's condition and review of old charts   Care discussed with: admitting provider     ED Course / MDM    Medical Decision Making Amount and/or Complexity of Data Reviewed Labs: ordered. Radiology: ordered.  Risk Prescription drug management. Decision regarding hospitalization.   Patient recommended for admission due to hypoxia secondary to influenza with recently recovered right lower lung mass/loculated pleural effusion.  Patient accepted by admitting physician Dr. Elvera Lennox.       Franne Forts, DO 10/27/23 1638    Franne Forts, DO 10/27/23 [phone]

## 2023-10-27 NOTE — H&P (Addendum)
History and Physical    Patient: Daniel Terrell EXB:[id number] DOB: 1946/01/20 DOA: 10/27/2023 DOS: the patient was seen and examined on 10/27/2023 PCP: Hurshel Party, NP  Patient coming from: [facility].  Chief Complaint:  Chief Complaint  Patient presents with   Chest Pain   Shortness of Breath   HPI: Daniel Terrell is a 78 y.o. male with medical history significant of prior tobacco use, COPD, OSA, who was recently hospitalized in [place] for questionable pulmonary mass/pleural effusion, seen by pulmonology (dr. Francine Graven) as an outpatient and treated with agumentin (and other prior abx) that he is still taking, with plans that if it is persistent despite antibiotics to do PET scan/malignancy workup, currently on Augmentin as an outpatient,  Patient reports that for the last 3-4 days he has new onset cough with green expectoration sensation of shortness of breath, weakness, chills, ( slight confusion per wife.) tmax at home was 101F today with rigors.  Patient initially presented to urgent care facility and [facility].  Patient was found to be hypoxic and transferred to [facility] [place].  Patient found to be influenza A positive there.  As well as hypoxic on ambulation. BP was 89/60. Therefore transferred to [facility] inpatient unit.  A CT scan did not show any PE but it did show that his "lung mass" is actually improving.   Patient is s/p vacno ceftriaxone azithromycin as well as 3 L of fluid.  Patient reports ongoing cough fatigue, on 2 L/min of supplementary oxygen.[name]  However no diarrhea no vomiting tolerating diet.  Ambulating to the bathroom as needed with oxygen. Review of Systems: As mentioned in the history of present illness. All other systems reviewed and are negative. 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   Erectile dysfunction due to arterial insufficiency 11/09/2020   Essential hypertension 12/25/2020   Essential tremor    Family history of breast cancer    Family history of colon cancer    Fracture of shaft of metacarpal bone 07/25/2022   GERD (gastroesophageal reflux disease)    Hypertriglyceridemia    IBS (irritable bowel syndrome)    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   Nocturnal hypoxemia 10/03/2022   uses O2 at night for COPD   Obstructive sleep apnea 01/06/2009   NPSG 01/12/03 AHI 42/ hr, loud snore, weight 200 lbs  Could not get comfortable with CPAP      OCD (obsessive compulsive disorder)    Pain in right hand 07/25/2022   PONV (postoperative nausea and vomiting)    Prostate cancer (HCC)    Pure hypercholesterolemia 04/30/2009   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 His smoking use included cigarettes. He has never used smokeless tobacco. He reports that he does not drink alcohol and does not use drugs.  Allergies  Allergen Reactions   Amoxicillin Nausea Only and Diarrhea   Levofloxacin     REACTION: disorientation  Other Reaction(s): confusion   Moxifloxacin     Other Reaction(s): confusion   Ofloxacin      REACTION: disorientation  Other Reaction(s): confusion   Rosuvastatin     Other Reaction(s): myalgias (muscle pain)   Rosuvastatin Calcium Other (See Comments)    Muscle pain  Other Reaction(s): Myalgias   Topiramate Other (See Comments)    drowsy    Family History  Problem Relation Age of Onset   Colon polyps Mother    Tremor Mother    Breast cancer Mother 46   Memory loss Mother        early 57s   Heart attack Father    Alcohol abuse Father    Heart disease Father    Breast cancer Maternal Grandmother    Dementia Maternal Grandmother    Colon cancer Maternal Grandfather    Esophageal cancer Neg Hx    Stomach cancer Neg Hx    Rectal cancer Neg Hx     Prior to Admission medications   Medication Sig Start Date End Date Taking? Authorizing Provider  albuterol (PROVENTIL HFA;VENTOLIN HFA) 108 (90 BASE) MCG/ACT inhaler Inhale 1-2 puffs into the lungs every 6 (six) hours as needed for wheezing or shortness of breath.    [provider]  amLODipine (NORVASC) 5 MG tablet Take 1 tablet (5 mg total) by mouth daily for blood pressure. 05/30/23     amoxicillin-clavulanate (AUGMENTIN) 875-125 MG tablet Take 1 tablet by mouth 2 (two) times daily. 10/17/23   Martina Sinner, MD  Armodafinil 250 MG tablet Take 0.5-1 tablets (125-250 mg total) by mouth daily as needed for sleepiness. 04/10/23   Waymon Budge, MD  aspirin 81 MG tablet Take 81 mg by mouth daily.    [provider]  cetirizine (ZYRTEC) 10 MG tablet Take 10 mg by mouth daily.    [provider]  cholecalciferol (VITAMIN D3) 25 MCG (1000 UT) tablet Take 5,000 Units by mouth daily.    [provider]  Choline Fenofibrate (TRILIPIX) 135 MG capsule Take 1 capsule (135 mg total) by mouth daily for cholesterol 10/03/23     fluticasone (FLONASE) 50 MCG/ACT nasal spray Place 2 sprays into both nostrils daily. 03/16/23     Fluticasone-Umeclidin-Vilant (TRELEGY ELLIPTA) 100-62.5-25 MCG/ACT AEPB Inhale 1  puff into the lungs daily. 08/04/23   Jetty Duhamel D, MD  glucosamine-chondroitin 500-400 MG tablet Take 1 tablet by mouth 2 (two) times daily.    [provider]  hyoscyamine (LEVSIN SL) 0.125 MG SL tablet Take by mouth every 6 (six) hours as needed for cramping.    [provider]  hyoscyamine (LEVSIN/SL) 0.125 MG SL tablet Take 1 tablet (0.125 mg total) by mouth every 6 (six) hours as needed for abdominal pain. 09/15/23     ipratropium-albuterol (DUONEB) 0.5-2.5 (3) MG/3ML SOLN Take 3 mLs by nebulization.    [provider]  meloxicam (MOBIC) 15 MG tablet Take 1 tablet (15 mg total) by mouth daily for joint pain. 04/25/23  montelukast (SINGULAIR) 10 MG tablet 1 tab by mouth daily 10/27/23     Multiple Vitamins-Minerals (MULTIVITAL PO) Take 1 tablet by mouth daily.    [provider]  omeprazole (PRILOSEC) 20 MG capsule Take 1 capsule (20 mg total) by mouth daily. 10/09/23     OXYGEN Take 2 L by mouth at bedtime. Uses with CPAP face mask    [provider]  PARoxetine (PAXIL) 30 MG tablet Take 2 tablets (60 mg total) by mouth daily. 05/30/23     propranolol ER (INDERAL LA) 80 MG 24 hr capsule Take 1 capsule (80 mg total) by mouth daily. 10/09/23       Physical Exam: Vitals:   10/27/23 1711 10/27/23 1745 10/27/23 1905 10/27/23 2031  BP: 98/63 (!) 119/104 108/70 102/68  Pulse: 72   73  Resp: (!) 25 (!) 33 18 16  Temp: 98.3 F (36.8 C)  98.8 F (37.1 C) 98.7 F (37.1 C)  TempSrc:   Oral Oral  SpO2: 92%  96% 99%  Weight:      Height:       General: Alert awake oriented x 3 gives a coherent account of his symptoms appears to be in no distress, pleasant Respiratory exam: Bilateral intravesicular Cardiovascular exam S1-S2 normal Abdomen all quadrant soft nontender Extremities warm without edema. Data Reviewed:  Labs on Admission:  Results for orders placed or performed during the hospital encounter of 10/27/23 (from the past 24 hours)  Resp  panel by RT-PCR (RSV, Flu A&B, Covid) Anterior Nasal Swab     Status: Abnormal   Collection Time: 10/27/23 12:49 PM   Specimen: Anterior Nasal Swab  Result Value Ref Range   SARS Coronavirus 2 by RT PCR NEGATIVE NEGATIVE   Influenza A by PCR POSITIVE (A) NEGATIVE   Influenza B by PCR NEGATIVE NEGATIVE   Resp Syncytial Virus by PCR NEGATIVE NEGATIVE  Lactic acid, plasma     Status: None   Collection Time: 10/27/23 12:49 PM  Result Value Ref Range   Lactic Acid, Venous 0.9 0.5 - 1.9 mmol/L  Comprehensive metabolic panel     Status: Abnormal   Collection Time: 10/27/23 12:49 PM  Result Value Ref Range   Sodium 138 135 - 145 mmol/L   Potassium 4.1 3.5 - 5.1 mmol/L   Chloride 103 98 - 111 mmol/L   CO2 25 22 - 32 mmol/L   Glucose, Bld 99 70 - 99 mg/dL   BUN 29 (H) 8 - 23 mg/dL   Creatinine, Ser 9.56 0.61 - 1.24 mg/dL   Calcium 9.8 8.9 - 21.3 mg/dL   Total Protein 7.5 6.5 - 8.1 g/dL   Albumin 4.1 3.5 - 5.0 g/dL   AST 23 15 - 41 U/L   ALT 20 0 - 44 U/L   Alkaline Phosphatase 54 38 - 126 U/L   Total Bilirubin 0.7 0.0 - 1.2 mg/dL   GFR, Estimated >08 >65 mL/min   Anion gap 10 5 - 15  CBC with Differential     Status: None   Collection Time: 10/27/23 12:49 PM  Result Value Ref Range   WBC 9.1 4.0 - 10.5 K/uL   RBC 5.15 4.22 - 5.81 MIL/uL   Hemoglobin 15.5 13.0 - 17.0 g/dL   HCT 78.4 69.6 - 29.5 %   MCV 89.9 80.0 - 100.0 fL   MCH 30.1 26.0 - 34.0 pg   MCHC 33.5 30.0 - 36.0 g/dL   RDW 28.4 13.2 - 44.0 %   Platelets  254 150 - 400 K/uL   nRBC 0.0 0.0 - 0.2 %   Neutrophils Relative % 77 %   Neutro Abs 7.4 1.7 - 7.7 K/uL   Lymphocytes Relative 12 %   Lymphs Abs 1.1 0.7 - 4.0 K/uL   Monocytes Relative 9 %   Monocytes Absolute 0.8 0.1 - 1.0 K/uL   Eosinophils Relative 1 %   Eosinophils Absolute 0.1 0.0 - 0.5 K/uL   Basophils Relative 0 %   Basophils Absolute 0.0 0.0 - 0.1 K/uL   Immature Granulocytes 1 %   Abs Immature Granulocytes 0.05 0.00 - 0.07 K/uL  Protime-INR     Status:  None   Collection Time: 10/27/23 12:49 PM  Result Value Ref Range   Prothrombin Time 13.6 11.4 - 15.2 seconds   INR 1.0 0.8 - 1.2  APTT     Status: None   Collection Time: 10/27/23 12:49 PM  Result Value Ref Range   aPTT 33 24 - 36 seconds  Blood Culture (routine x 2)     Status: None (Preliminary result)   Collection Time: 10/27/23 12:54 PM   Specimen: BLOOD RIGHT WRIST  Result Value Ref Range   Specimen Description      BLOOD RIGHT WRIST Performed at [facility], [address] [place], [place] [address]    Special Requests      Blood Culture results may not be optimal due to an inadequate volume of blood received in culture bottles BOTTLES DRAWN AEROBIC AND ANAEROBIC Performed at [facility], [address] [place], [place] [address]    Culture PENDING    Report Status PENDING   Lactic acid, plasma     Status: None   Collection Time: 10/27/23  2:30 PM  Result Value Ref Range   Lactic Acid, Venous 0.9 0.5 - 1.9 mmol/L   Basic Metabolic Panel: Recent Labs  Lab 10/27/23 1249  NA 138  K 4.1  CL 103  CO2 25  GLUCOSE 99  BUN 29*  CREATININE 1.17  CALCIUM 9.8   Liver Function Tests: Recent Labs  Lab 10/27/23 1249  AST 23  ALT 20  ALKPHOS 54  BILITOT 0.7  PROT 7.5  ALBUMIN 4.1   No results for input(s): "LIPASE", "AMYLASE" in the last 168 hours. No results for input(s): "AMMONIA" in the last 168 hours. CBC: Recent Labs  Lab 10/27/23 1249  WBC 9.1  NEUTROABS 7.4  HGB 15.5  HCT 46.3  MCV 89.9  PLT 254   Cardiac Enzymes: No results for input(s): "CKTOTAL", "CKMB", "CKMBINDEX", "TROPONINIHS" in the last 168 hours.  BNP (last 3 results) No results for input(s): "PROBNP" in the last 8760 hours. CBG: No results for input(s): "GLUCAP" in the last 168 hours.  Radiological Exams on Admission:  CT Angio Chest PE W and/or Wo Contrast Result Date: 10/27/2023 CLINICAL DATA:  Shortness of breath, fever, sore throat EXAM: CT  ANGIOGRAPHY CHEST WITH CONTRAST TECHNIQUE: Multidetector CT imaging of the chest was performed using the standard protocol during bolus administration of intravenous contrast. Multiplanar CT image reconstructions and MIPs were obtained to evaluate the vascular anatomy. RADIATION DOSE REDUCTION: This exam was performed according to the departmental dose-optimization program which includes automated exposure control, adjustment of the mA and/or kV according to patient size and/or use of iterative reconstruction technique. CONTRAST:  75mL OMNIPAQUE IOHEXOL 350 MG/ML SOLN COMPARISON:  Same day chest radiograph, CTA chest dated 09/17/2023 FINDINGS: Cardiovascular: The study is high quality for the evaluation of  pulmonary embolism. There are no filling defects in the central, lobar, segmental or subsegmental pulmonary artery branches to suggest acute pulmonary embolism. Ascending thoracic aorta measures 4.4 x 4.3 cm, unchanged. Normal heart size. No significant pericardial fluid/thickening. Coronary artery calcifications and aortic atherosclerosis. Mediastinum/Nodes: Imaged thyroid gland without nodules meeting criteria for imaging follow-up by size. Normal esophagus. 1.1 cm right hilar lymph node (4:61), unchanged. Lungs/Pleura: The central airways are patent. Moderate diffuse bronchial wall thickening with bilateral lower lobe subsegmental mucous plugging. Previously noted right lower lobe masses are nearly completely resolved with residual subpleural 2.4 x 1.1 cm rounded opacity (6:69), previously 4.3 x 3.2 cm. New 3 mm basilar right lower lobe nodule (6:77). Subpleural lingular and left lower lobe atelectasis. No pneumothorax. No pleural effusion. Upper abdomen: Subcentimeter hepatic segment 2 hypodensity (4:104), too small to characterize. Colonic diverticulosis without acute diverticulitis. Musculoskeletal: No acute or abnormal lytic or blastic osseous lesions. Review of the MIP images confirms the above findings.  IMPRESSION: 1. No evidence of pulmonary embolism. 2. Moderate diffuse bronchial wall thickening with bilateral lower lobe subsegmental mucous plugging, which can be seen in the setting of bronchitis. 3. Previously noted right lower lobe masses are nearly completely resolved with residual subpleural 2.4 x 1.1 cm rounded opacity, likely resolving infection/inflammation. 4. New 3 mm basilar right lower lobe nodule, likely infectious/inflammatory. 5. Stable enlarged right hilar lymph node, likely reactive. 6. Ascending thoracic aorta measures 4.4 cm. Recommend annual imaging followup by CTA or MRA. This recommendation follows 2010 ACCF/AHA/AATS/ACR/ASA/SCA/SCAI/SIR/STS/SVM Guidelines for the Diagnosis and Management of Patients with Thoracic Aortic Disease. Circulation. 2010; 121: W098-J191. Aortic aneurysm NOS (ICD10-I71.9) 7. Aortic Atherosclerosis (ICD10-I70.0). Coronary artery calcifications. Assessment for potential risk factor modification, dietary therapy or pharmacologic therapy may be warranted, if clinically indicated. Electronically Signed   By: Agustin Cree M.D.   On: 10/27/2023 14:56   DG Chest Port 1 View Result Date: 10/27/2023 CLINICAL DATA:  Questionable sepsis.  Evaluate for abnormality. EXAM: PORTABLE CHEST 1 VIEW COMPARISON:  10/17/2023 FINDINGS: Heart size is normal. Lungs are hypoinflated no signs of pleural effusion, interstitial edema or airspace consolidation. Scar versus atelectasis noted in the left base. No mass identified. IMPRESSION: 1. Hypoinflation. 2. Left base scar versus atelectasis. 3. No mass identified. Given the normal chest radiograph from today and 10/17/2023 the subpleural masslike architectural distortion with overlying pleural effusion seen on the CT from 09/17/2023 likely represented an area of pneumonia with sub pulmonic effusion. As mentioned on 10/17/2023 a follow-up CT of the chest may be helpful to confirm complete resolution of this process and to exclude underlying  malignancy. Electronically Signed   By: Signa Kell M.D.   On: 10/27/2023 13:28     No intake/output data recorded. No intake/output data recorded.    EKG - NSR    Assessment and Plan: * Influenza A Patient has been on chronic suppressive antibiotic therapy with Augmentin for last several weeks for prior known lung "masses ".  In spite of this presents with new onset of fever cough and expectoration over the last 3 days found to be influenza A positive.  Also found to be hypoxic requiring 2 L/min of supplementary oxygen newly.  Will treat with oseltamivir.  Given green expectoration by patient as well as rigors at home, consideration include possible MRSA infection.  Therefore patient was treated with vancomycin and ceftriaxone and azithromycin already in the ER.  Will continue treatment with doxycycline and ceftriaxone.  Blood cultures are pending  Patient has prior  diagnosis of COPD, at this time does not appear to have marked COPD exacerbation.  I will treat the patient with inhaled anticholinergic steroids and long-acting beta adrenergic's.  Resume Augmentin as per pulmonary at the time of discharge  Essential hypertension Hold propranolol as well as amlodipine given low blood pressure in the ER.  Asthma C/w montelukast   Please review med rec after pharmacy input. Some meds ordered based on Dr. Francine Graven note 10/17/2023    Advance Care Planning:   Code Status: Not on file full code  Consults: None  Family Communication: Per patient  Severity of Illness: The appropriate patient status for this patient is INPATIENT. Inpatient status is judged to be reasonable and necessary in order to provide the required intensity of service to ensure the patient's safety. The patient's presenting symptoms, physical exam findings, and initial radiographic and laboratory data in the context of their chronic comorbidities is felt to place them at high risk for further clinical deterioration.  Furthermore, it is not anticipated that the patient will be medically stable for discharge from the hospital within 2 midnights of admission.   * I certify that at the point of admission it is my clinical judgment that the patient will require inpatient hospital care spanning beyond 2 midnights from the point of admission due to high intensity of service, high risk for further deterioration and high frequency of surveillance required.*  Author: Nolberto Hanlon, MD 10/27/2023 9:16 PM  For on call review [email or web address].

## 2023-10-27 NOTE — ED Triage Notes (Signed)
Pt just seen at [facility] for same states has a mass on lower rt lung has been on antibiotics  but now his O2 sat has dropped and he is on  home O@ at 2 as needed and at night  had a fever this am  sent for further tests

## 2023-10-27 NOTE — Assessment & Plan Note (Signed)
Hold propranolol as well as amlodipine given low blood pressure in the ER.

## 2023-10-27 NOTE — ED Provider Notes (Signed)
[place] [facility] AT [facility] Provider Note   CSN: [id number] Arrival date & time: 10/27/23  1225     History {Add pertinent medical, surgical, social history, OB history to HPI:1} Chief Complaint  Patient presents with   Chest Pain   Shortness of Breath    Daniel Terrell is a 78 y.o. male.  78 year old male with recently diagnosed mass versus loculated pleural effusion presenting to the emergency department today with cough, fever, and shortness of breath.  This has gotten worse now over the past few weeks.  The patient states he has been on Augmentin now for the past 5 weeks.  He had a syncopal episode/presyncopal episode this morning.  The patient did not hit his head when he fell.  He was brought to the ER for further evaluation regarding this.  He went to urgent care initially and his pulse ox was 88% on room air.  The patient normally wears oxygen at night only currently.  He did have a fever of 101 yesterday.  He was brought to the ER for further evaluation returns.  He does report coughing up a lot of thick sputum.  Denies any hemoptysis.   Chest Pain Associated symptoms: cough and shortness of breath   Shortness of Breath Associated symptoms: chest pain and cough        Home Medications Prior to Admission medications   Medication Sig Start Date End Date Taking? Authorizing Provider  albuterol (PROVENTIL HFA;VENTOLIN HFA) 108 (90 BASE) MCG/ACT inhaler Inhale 1-2 puffs into the lungs every 6 (six) hours as needed for wheezing or shortness of breath.    [provider]  amLODipine (NORVASC) 5 MG tablet Take 1 tablet (5 mg total) by mouth daily for blood pressure. 05/30/23     amoxicillin-clavulanate (AUGMENTIN) 875-125 MG tablet Take 1 tablet by mouth 2 (two) times daily. 10/17/23   Martina Sinner, MD  Armodafinil 250 MG tablet Take 0.5-1 tablets (125-250 mg total) by mouth daily as needed for sleepiness. 04/10/23   Waymon Budge, MD   aspirin 81 MG tablet Take 81 mg by mouth daily.    [provider]  cetirizine (ZYRTEC) 10 MG tablet Take 10 mg by mouth daily.    [provider]  cholecalciferol (VITAMIN D3) 25 MCG (1000 UT) tablet Take 5,000 Units by mouth daily.    [provider]  Choline Fenofibrate (TRILIPIX) 135 MG capsule Take 1 capsule (135 mg total) by mouth daily for cholesterol 10/03/23     fluticasone (FLONASE) 50 MCG/ACT nasal spray Place 2 sprays into both nostrils daily. 03/16/23     Fluticasone-Umeclidin-Vilant (TRELEGY ELLIPTA) 100-62.5-25 MCG/ACT AEPB Inhale 1 puff into the lungs daily. 08/04/23   Jetty Duhamel D, MD  glucosamine-chondroitin 500-400 MG tablet Take 1 tablet by mouth 2 (two) times daily.    [provider]  hyoscyamine (LEVSIN SL) 0.125 MG SL tablet Take by mouth every 6 (six) hours as needed for cramping.    [provider]  hyoscyamine (LEVSIN/SL) 0.125 MG SL tablet Take 1 tablet (0.125 mg total) by mouth every 6 (six) hours as needed for abdominal pain. 09/15/23     ipratropium-albuterol (DUONEB) 0.5-2.5 (3) MG/3ML SOLN Take 3 mLs by nebulization.    [provider]  meloxicam (MOBIC) 15 MG tablet Take 1 tablet (15 mg total) by mouth daily for joint pain. 04/25/23     montelukast (SINGULAIR) 10 MG tablet 1 tab by mouth daily 10/27/23  Multiple Vitamins-Minerals (MULTIVITAL PO) Take 1 tablet by mouth daily.    [provider]  omeprazole (PRILOSEC) 20 MG capsule Take 1 capsule (20 mg total) by mouth daily. 10/09/23     OXYGEN Take 2 L by mouth at bedtime. Uses with CPAP face mask    [provider]  PARoxetine (PAXIL) 30 MG tablet Take 2 tablets (60 mg total) by mouth daily. 05/30/23     propranolol ER (INDERAL LA) 80 MG 24 hr capsule Take 1 capsule (80 mg total) by mouth daily. 10/09/23         Allergies    Amoxicillin, Levofloxacin, Moxifloxacin, Ofloxacin, Rosuvastatin, Rosuvastatin calcium, and Topiramate    Review of  Systems   Review of Systems  Respiratory:  Positive for cough and shortness of breath.   Cardiovascular:  Positive for chest pain.    Physical Exam Updated Vital Signs Ht 5\' 10"  (1.778 m)   Wt 91.4 kg   BMI 28.91 kg/m  Physical Exam Vitals and nursing note reviewed.   Gen: Chronically ill-appearing, no acute distress, mild conversational dyspnea noted Eyes: PERRL, EOMI HEENT: no oropharyngeal swelling Neck: trachea midline Resp: Diminished at right lower lung field with faint wheezes with forced expiration, diminished Card: RRR, no murmurs, rubs, or gallops Abd: nontender, nondistended Extremities: no calf tenderness, no edema Vascular: 2+ radial pulses bilaterally, 2+ DP pulses bilaterally Skin: no rashes Psyc: acting appropriately   ED Results / Procedures / Treatments   Labs (all labs ordered are listed, but only abnormal results are displayed) Labs Reviewed  RESP PANEL BY RT-PCR (RSV, FLU A&B, COVID)  RVPGX2  CULTURE, BLOOD (ROUTINE X 2)  CULTURE, BLOOD (ROUTINE X 2)  LACTIC ACID, PLASMA  LACTIC ACID, PLASMA  COMPREHENSIVE METABOLIC PANEL  CBC WITH DIFFERENTIAL/PLATELET  PROTIME-INR  APTT    EKG None  Radiology No results found.  Procedures Procedures  {Document cardiac monitor, telemetry assessment procedure when appropriate:1}  Medications Ordered in ED Medications  lactated ringers infusion (has no administration in time range)  lactated ringers bolus 1,000 mL (has no administration in time range)    And  lactated ringers bolus 1,000 mL (has no administration in time range)    And  lactated ringers bolus 1,000 mL (has no administration in time range)  cefTRIAXone (ROCEPHIN) 2 g in sodium chloride 0.9 % 100 mL IVPB (has no administration in time range)  azithromycin (ZITHROMAX) 500 mg in sodium chloride 0.9 % 250 mL IVPB (has no administration in time range)  vancomycin (VANCOCIN) IVPB 1000 mg/200 mL premix (has no administration in time range)   ipratropium-albuterol (DUONEB) 0.5-2.5 (3) MG/3ML nebulizer solution 3 mL (has no administration in time range)  ipratropium-albuterol (DUONEB) 0.5-2.5 (3) MG/3ML nebulizer solution 3 mL (has no administration in time range)  vancomycin (VANCOCIN) IVPB 1000 mg/200 mL premix (has no administration in time range)  ipratropium-albuterol (DUONEB) 0.5-2.5 (3) MG/3ML nebulizer solution 3 mL (3 mLs Nebulization Given 10/27/23 1300)    ED Course/ Medical Decision Making/ A&P   {   Click here for ABCD2, HEART and other calculatorsREFRESH Note before signing :1}                              Medical Decision Making 78 year old male with past medical history of of loculated pleural effusion versus lung mass presenting to the emergency department today with fever, cough, and shortness of breath.  I will further evaluate the patient here  with a sepsis workup.  Will order a CT scan instead of an x-ray as the patient was scheduled to have 1 of these next week anyway.  I will cover the patient empirically with vancomycin, Rocephin, and azithromycin for community-acquired pneumonia and with the vancomycin for the loculated pleural effusion.  Will give him DuoNebs here.  I will give him sepsis fluids as his initial blood pressures on arrival are in the high 80s and low 90s systolic.  He will require admission.  Amount and/or Complexity of Data Reviewed Labs: ordered. Radiology: ordered.  Risk Prescription drug management.   ***  {Document critical care time when appropriate:1} {Document review of labs and clinical decision tools ie heart score, Chads2Vasc2 etc:1}  {Document your independent review of radiology images, and any outside records:1} {Document your discussion with family members, caretakers, and with consultants:1} {Document social determinants of health affecting pt's care:1} {Document your decision making why or why not admission, treatments were needed:1} Final Clinical Impression(s) / ED  Diagnoses Final diagnoses:  None    Rx / DC Orders ED Discharge Orders     None

## 2023-10-27 NOTE — ED Provider Notes (Signed)
[facility]    CSN: [id number] Arrival date & time: 10/27/23  1122      History   Chief Complaint No chief complaint on file.   HPI Daniel Terrell is a 78 y.o. male.   HPI Here for cough and congestion and near syncope or possible syncope this morning.  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  He was feeling some better in the last for 5 days has begun to have congestion and cough and had a temperature of 101.  This morning he felt foggy or dizzy.  He states he was disoriented but is unclear if he was truly disoriented.  He fell and lost control of his bladder.  His family feels he may have passed out.   At triage his oxygen saturation on room air is 88%.  He was placed on 2 L of O2 with improvement.  He states he uses oxygen at home at night. 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   Erectile dysfunction due to arterial insufficiency 11/09/2020   Essential hypertension 12/25/2020   Essential tremor    Family history of breast cancer    Family history of colon cancer    Fracture of shaft of metacarpal bone 07/25/2022   GERD (gastroesophageal reflux disease)    Hypertriglyceridemia    IBS (irritable bowel syndrome)    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   Nocturnal hypoxemia 10/03/2022   uses O2 at night for COPD   Obstructive sleep apnea 01/06/2009   NPSG 01/12/03 AHI 42/ hr, loud snore, weight 200 lbs  Could not get comfortable with CPAP      OCD (obsessive compulsive  disorder)    Pain in right hand 07/25/2022   PONV (postoperative nausea and vomiting)    Prostate cancer (HCC)    Pure hypercholesterolemia 04/30/2009   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   Restless legs syndrome (RLS) 11/28/2013   Seasonal and perennial allergic rhinitis 01/06/2009    Patient Active Problem List   Diagnosis Date Noted   Genetic testing 06/19/2023   Family history of breast cancer    Family history of colon cancer    Lacunar infarction (HCC)    Nocturnal hypoxemia 10/03/2022   Pain in right hand 07/25/2022   Acquired hallux rigidus of right foot 06/01/2021   Essential hypertension 12/25/2020   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   Prostate cancer (HCC)    PONV (postoperative nausea and vomiting)    OCD (obsessive compulsive disorder)    IBS (irritable bowel syndrome)    Hypertriglyceridemia    COPD mixed type (HCC)    Colon polyps    Arthritis    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   Erectile dysfunction due to arterial insufficiency 11/09/2020   Dyspnea on exertion 10/02/2019   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   Essential tremor 11/28/2013   Restless legs syndrome (RLS) 11/28/2013   Acute sinusitis 05/06/2012   Pure hypercholesterolemia 04/30/2009  Asthma 01/09/2009   Obstructive sleep apnea 01/06/2009   Seasonal and perennial allergic rhinitis 01/06/2009   Chronic bronchitis (HCC) 01/06/2009    Past Surgical History:  Procedure Laterality Date   BLADDER SURGERY     extension   COLONOSCOPY  08/13/2009   COLONOSCOPY  02/25/2022   FINGER SURGERY     left x 3   GREAT TOE ARTHRODESIS, INTERPHALANGEAL JOINT     KNEE ARTHROSCOPY     left   PROSTATECTOMY     TONSILLECTOMY         Home Medications    Prior to Admission medications   Medication Sig Start Date End Date Taking? Authorizing Provider  albuterol (PROVENTIL HFA;VENTOLIN HFA) 108 (90 BASE) MCG/ACT inhaler Inhale 1-2 puffs into the lungs every 6 (six) hours as needed for wheezing or  shortness of breath.    [provider]  amLODipine (NORVASC) 5 MG tablet Take 1 tablet (5 mg total) by mouth daily for blood pressure. 05/30/23     amoxicillin-clavulanate (AUGMENTIN) 875-125 MG tablet Take 1 tablet by mouth 2 (two) times daily. 10/17/23   Martina Sinner, MD  Armodafinil 250 MG tablet Take 0.5-1 tablets (125-250 mg total) by mouth daily as needed for sleepiness. 04/10/23   Waymon Budge, MD  aspirin 81 MG tablet Take 81 mg by mouth daily.    [provider]  cetirizine (ZYRTEC) 10 MG tablet Take 10 mg by mouth daily.    [provider]  cholecalciferol (VITAMIN D3) 25 MCG (1000 UT) tablet Take 5,000 Units by mouth daily.    [provider]  Choline Fenofibrate (TRILIPIX) 135 MG capsule Take 1 capsule (135 mg total) by mouth daily for cholesterol 10/03/23     fluticasone (FLONASE) 50 MCG/ACT nasal spray Place 2 sprays into both nostrils daily. 03/16/23     Fluticasone-Umeclidin-Vilant (TRELEGY ELLIPTA) 100-62.5-25 MCG/ACT AEPB Inhale 1 puff into the lungs daily. 08/04/23   Jetty Duhamel D, MD  glucosamine-chondroitin 500-400 MG tablet Take 1 tablet by mouth 2 (two) times daily.    [provider]  hyoscyamine (LEVSIN SL) 0.125 MG SL tablet Take by mouth every 6 (six) hours as needed for cramping.    [provider]  hyoscyamine (LEVSIN/SL) 0.125 MG SL tablet Take 1 tablet (0.125 mg total) by mouth every 6 (six) hours as needed for abdominal pain. 09/15/23     ipratropium-albuterol (DUONEB) 0.5-2.5 (3) MG/3ML SOLN Take 3 mLs by nebulization.    [provider]  meloxicam (MOBIC) 15 MG tablet Take 1 tablet (15 mg total) by mouth daily for joint pain. 04/25/23     montelukast (SINGULAIR) 10 MG tablet 1 tab by mouth daily 10/27/23     Multiple Vitamins-Minerals (MULTIVITAL PO) Take 1 tablet by mouth daily.    [provider]  omeprazole (PRILOSEC) 20 MG capsule Take 1 capsule (20 mg total) by mouth daily. 10/09/23      OXYGEN Take 2 L by mouth at bedtime. Uses with CPAP face mask    [provider]  PARoxetine (PAXIL) 30 MG tablet Take 2 tablets (60 mg total) by mouth daily. 05/30/23     propranolol ER (INDERAL LA) 80 MG 24 hr capsule Take 1 capsule (80 mg total) by mouth daily. 10/09/23       Family History Family History  Problem Relation Age of Onset   Colon polyps Mother    Tremor Mother    Breast cancer Mother 55   Memory loss Mother  early 90s   Heart attack Father    Alcohol abuse Father    Heart disease Father    Breast cancer Maternal Grandmother    Dementia Maternal Grandmother    Colon cancer Maternal Grandfather    Esophageal cancer Neg Hx    Stomach cancer Neg Hx    Rectal cancer Neg Hx     Social History Social History   Tobacco Use   Smoking status: Former    Current packs/day: 0.00    Types: Cigarettes    Quit date: 09/26/1970    Years since quitting: 53.1   Smokeless tobacco: Never  Vaping Use   Vaping status: Never Used  Substance Use Topics   Alcohol use: No   Drug use: No     Allergies   Amoxicillin, Levofloxacin, Moxifloxacin, Ofloxacin, Rosuvastatin, Rosuvastatin calcium, and Topiramate   Review of Systems Review of Systems   Physical Exam Triage Vital Signs ED Triage Vitals [10/27/23 1136]  Encounter Vitals Group     BP 106/70     Systolic BP Percentile      Diastolic BP Percentile      Pulse Rate 89     Resp (!) 26     Temp 98 F (36.7 C)     Temp Source Oral     SpO2 (!) 88 %     Weight      Height      Head Circumference      Peak Flow      Pain Score      Pain Loc      Pain Education      Exclude from Growth Chart    No data found.  Updated Vital Signs BP 106/70 (BP Location: Right Arm)   Pulse 89   Temp 98 F (36.7 C) (Oral)   Resp (!) 26   SpO2 (!) 88%   Visual Acuity Right Eye Distance:   Left Eye Distance:   Bilateral Distance:    Right Eye Near:   Left Eye Near:    Bilateral Near:     Physical  Exam Vitals reviewed.  Constitutional:      General: He is not in acute distress.    Appearance: He is not ill-appearing, toxic-appearing or diaphoretic.  HENT:     Mouth/Throat:     Mouth: Mucous membranes are moist.  Cardiovascular:     Rate and Rhythm: Normal rate and regular rhythm.  Pulmonary:     Comments: Air movement is good and I do not hear any wheezes. Skin:    Coloration: Skin is not jaundiced or pale.  Neurological:     General: No focal deficit present.     Mental Status: He is alert and oriented to person, place, and time.  Psychiatric:        Behavior: Behavior normal.      UC Treatments / Results  Labs (all labs ordered are listed, but only abnormal results are displayed) Labs Reviewed - No data to display  EKG   Radiology No results found.  Procedures Procedures (including critical care time)  Medications Ordered in UC Medications - No data to display  Initial Impression / Assessment and Plan / UC Course  I have reviewed the triage vital signs and the nursing notes.  Pertinent labs & imaging results that were available during my care of the patient were reviewed by me and considered in my medical decision making (see chart for details).     His son is  here with him.  I have asked him to proceed to the emergency room for further evaluation that we cannot provide at this urgent care site.  We discussed considering going by ambulance.  His son states he was able to get him in and out of the car fairly easily on his way here.  We decided that it would be safe for him to go by private car to the emergency room for further evaluation, since other vital signs are normal and his hypoxia is mild. Final Clinical Impressions(s) / UC Diagnoses   Final diagnoses:  None   Discharge Instructions   None    ED Prescriptions   None    PDMP not reviewed this encounter.   Zenia Resides, MD 10/27/23 1150

## 2023-10-28 ENCOUNTER — Encounter: Payer: Self-pay | Admitting: Pulmonary Disease

## 2023-10-28 DIAGNOSIS — J851 Abscess of lung with pneumonia: Secondary | ICD-10-CM | POA: Diagnosis not present

## 2023-10-28 DIAGNOSIS — G4733 Obstructive sleep apnea (adult) (pediatric): Secondary | ICD-10-CM | POA: Diagnosis not present

## 2023-10-28 DIAGNOSIS — J111 Influenza due to unidentified influenza virus with other respiratory manifestations: Secondary | ICD-10-CM

## 2023-10-28 DIAGNOSIS — J9601 Acute respiratory failure with hypoxia: Secondary | ICD-10-CM

## 2023-10-28 DIAGNOSIS — R0602 Shortness of breath: Secondary | ICD-10-CM | POA: Diagnosis present

## 2023-10-28 DIAGNOSIS — R911 Solitary pulmonary nodule: Secondary | ICD-10-CM | POA: Diagnosis not present

## 2023-10-28 DIAGNOSIS — J90 Pleural effusion, not elsewhere classified: Secondary | ICD-10-CM | POA: Diagnosis not present

## 2023-10-28 DIAGNOSIS — G2581 Restless legs syndrome: Secondary | ICD-10-CM | POA: Diagnosis not present

## 2023-10-28 DIAGNOSIS — Z791 Long term (current) use of non-steroidal anti-inflammatories (NSAID): Secondary | ICD-10-CM | POA: Diagnosis not present

## 2023-10-28 DIAGNOSIS — I10 Essential (primary) hypertension: Secondary | ICD-10-CM | POA: Diagnosis not present

## 2023-10-28 DIAGNOSIS — J101 Influenza due to other identified influenza virus with other respiratory manifestations: Secondary | ICD-10-CM | POA: Diagnosis not present

## 2023-10-28 DIAGNOSIS — Z888 Allergy status to other drugs, medicaments and biological substances status: Secondary | ICD-10-CM | POA: Diagnosis not present

## 2023-10-28 DIAGNOSIS — M1990 Unspecified osteoarthritis, unspecified site: Secondary | ICD-10-CM | POA: Diagnosis not present

## 2023-10-28 DIAGNOSIS — Z1152 Encounter for screening for COVID-19: Secondary | ICD-10-CM | POA: Diagnosis not present

## 2023-10-28 DIAGNOSIS — J1000 Influenza due to other identified influenza virus with unspecified type of pneumonia: Secondary | ICD-10-CM | POA: Diagnosis not present

## 2023-10-28 DIAGNOSIS — I7121 Aneurysm of the ascending aorta, without rupture: Secondary | ICD-10-CM | POA: Diagnosis not present

## 2023-10-28 DIAGNOSIS — E781 Pure hyperglyceridemia: Secondary | ICD-10-CM | POA: Diagnosis not present

## 2023-10-28 DIAGNOSIS — Z79899 Other long term (current) drug therapy: Secondary | ICD-10-CM | POA: Diagnosis not present

## 2023-10-28 DIAGNOSIS — Z7982 Long term (current) use of aspirin: Secondary | ICD-10-CM | POA: Diagnosis not present

## 2023-10-28 DIAGNOSIS — Z880 Allergy status to penicillin: Secondary | ICD-10-CM | POA: Diagnosis not present

## 2023-10-28 DIAGNOSIS — E7800 Pure hypercholesterolemia, unspecified: Secondary | ICD-10-CM | POA: Diagnosis not present

## 2023-10-28 DIAGNOSIS — Z860100 Personal history of colon polyps, unspecified: Secondary | ICD-10-CM | POA: Diagnosis not present

## 2023-10-28 DIAGNOSIS — Z8249 Family history of ischemic heart disease and other diseases of the circulatory system: Secondary | ICD-10-CM | POA: Diagnosis not present

## 2023-10-28 DIAGNOSIS — J440 Chronic obstructive pulmonary disease with acute lower respiratory infection: Secondary | ICD-10-CM | POA: Diagnosis not present

## 2023-10-28 DIAGNOSIS — Z87891 Personal history of nicotine dependence: Secondary | ICD-10-CM | POA: Diagnosis not present

## 2023-10-28 DIAGNOSIS — Z881 Allergy status to other antibiotic agents status: Secondary | ICD-10-CM | POA: Diagnosis not present

## 2023-10-28 DIAGNOSIS — D649 Anemia, unspecified: Secondary | ICD-10-CM | POA: Diagnosis not present

## 2023-10-28 LAB — CBC
HCT: 34.9 % — ABNORMAL LOW (ref 39.0–52.0)
Hemoglobin: 11.9 g/dL — ABNORMAL LOW (ref 13.0–17.0)
MCH: 30.7 pg (ref 26.0–34.0)
MCHC: 34.1 g/dL (ref 30.0–36.0)
MCV: 90.2 fL (ref 80.0–100.0)
Platelets: 225 10*3/uL (ref 150–400)
RBC: 3.87 MIL/uL — ABNORMAL LOW (ref 4.22–5.81)
RDW: 14.6 % (ref 11.5–15.5)
WBC: 4.3 10*3/uL (ref 4.0–10.5)
nRBC: 0.5 % — ABNORMAL HIGH (ref 0.0–0.2)

## 2023-10-28 LAB — BASIC METABOLIC PANEL
Anion gap: 8 (ref 5–15)
BUN: 21 mg/dL (ref 8–23)
CO2: 22 mmol/L (ref 22–32)
Calcium: 8.4 mg/dL — ABNORMAL LOW (ref 8.9–10.3)
Chloride: 106 mmol/L (ref 98–111)
Creatinine, Ser: 0.89 mg/dL (ref 0.61–1.24)
GFR, Estimated: >60 mL/min (ref >60)
Glucose, Bld: 83 mg/dL (ref 70–99)
Potassium: 3.7 mmol/L (ref 3.5–5.1)
Sodium: 136 mmol/L (ref 135–145)

## 2023-10-28 LAB — APTT: aPTT: 41 s — ABNORMAL HIGH (ref 24–36)

## 2023-10-28 LAB — PROTIME-INR
INR: 1.1 (ref 0.8–1.2)
Prothrombin Time: 14.6 s (ref 11.4–15.2)

## 2023-10-28 MED ORDER — MONTELUKAST SODIUM 10 MG PO TABS: 10.0000 mg | ORAL_TABLET | Freq: Every day | ORAL | Status: DC

## 2023-10-28 MED ORDER — PAROXETINE HCL 20 MG PO TABS: 60.0000 mg | ORAL_TABLET | Freq: Every day | ORAL | Status: DC

## 2023-10-28 MED ORDER — ARMODAFINIL 250 MG PO TABS: 125.0000 mg | ORAL_TABLET | Freq: Every day | ORAL | Status: DC | PRN

## 2023-10-28 MED ORDER — IPRATROPIUM-ALBUTEROL 0.5-2.5 (3) MG/3ML IN SOLN: 3.0000 mL | Freq: Four times a day (QID) | RESPIRATORY_TRACT | Status: DC

## 2023-10-28 MED ORDER — AMOXICILLIN-POT CLAVULANATE 875-125 MG PO TABS: 1.0000 | ORAL_TABLET | Freq: Two times a day (BID) | ORAL | Status: DC

## 2023-10-28 MED ORDER — POLYETHYLENE GLYCOL 3350 17 G PO PACK
17.0000 g | PACK | Freq: Every day | ORAL | Status: AC
  Filled 2023-10-28 (×2): qty 1

## 2023-10-28 MED ORDER — UMECLIDINIUM BROMIDE 62.5 MCG/ACT IN AEPB
1.0000 | INHALATION_SPRAY | Freq: Every day | RESPIRATORY_TRACT | Status: DC
  Administered 2023-10-29 – 2023-10-30 (×2): 1 via RESPIRATORY_TRACT
  Filled 2023-10-28: qty 7

## 2023-10-28 MED ORDER — ASPIRIN 81 MG PO TBEC
81.0000 mg | DELAYED_RELEASE_TABLET | Freq: Every day | ORAL | Status: DC
  Administered 2023-10-28 – 2023-10-30 (×3): 81 mg via ORAL
  Filled 2023-10-28 (×3): qty 1

## 2023-10-28 MED ORDER — ALBUTEROL SULFATE (2.5 MG/3ML) 0.083% IN NEBU
2.5000 mg | INHALATION_SOLUTION | RESPIRATORY_TRACT | Status: DC
  Administered 2023-10-28 (×3): 2.5 mg via RESPIRATORY_TRACT
  Filled 2023-10-28: qty 3

## 2023-10-28 MED ORDER — DOXYCYCLINE HYCLATE 100 MG PO TABS
100.0000 mg | ORAL_TABLET | Freq: Two times a day (BID) | ORAL | Status: DC
  Administered 2023-10-28 – 2023-10-30 (×5): 100 mg via ORAL
  Filled 2023-10-28 (×5): qty 1

## 2023-10-28 MED ORDER — ALBUTEROL SULFATE (2.5 MG/3ML) 0.083% IN NEBU: 2.5000 mg | INHALATION_SOLUTION | RESPIRATORY_TRACT | Status: DC

## 2023-10-28 MED ORDER — ALBUTEROL SULFATE (2.5 MG/3ML) 0.083% IN NEBU: 2.5000 mg | INHALATION_SOLUTION | RESPIRATORY_TRACT | Status: DC | PRN

## 2023-10-28 MED ORDER — AMOXICILLIN-POT CLAVULANATE 875-125 MG PO TABS
1.0000 | ORAL_TABLET | Freq: Two times a day (BID) | ORAL | Status: DC
  Administered 2023-10-28 – 2023-10-30 (×5): 1 via ORAL
  Filled 2023-10-28 (×5): qty 1

## 2023-10-28 MED ORDER — PANTOPRAZOLE SODIUM 40 MG PO TBEC: 40.0000 mg | DELAYED_RELEASE_TABLET | Freq: Every day | ORAL | Status: DC

## 2023-10-28 MED ORDER — ALBUTEROL SULFATE (2.5 MG/3ML) 0.083% IN NEBU
2.5000 mg | INHALATION_SOLUTION | Freq: Three times a day (TID) | RESPIRATORY_TRACT | Status: DC
  Administered 2023-10-28 – 2023-10-30 (×5): 2.5 mg via RESPIRATORY_TRACT
  Filled 2023-10-28 (×5): qty 3

## 2023-10-28 MED ORDER — FLUTICASONE FUROATE-VILANTEROL 100-25 MCG/ACT IN AEPB
1.0000 | INHALATION_SPRAY | Freq: Every day | RESPIRATORY_TRACT | Status: DC
  Administered 2023-10-28 – 2023-10-30 (×3): 1 via RESPIRATORY_TRACT
  Filled 2023-10-28: qty 28

## 2023-10-28 MED ORDER — IBUPROFEN 400 MG PO TABS
600.0000 mg | ORAL_TABLET | Freq: Four times a day (QID) | ORAL | Status: DC | PRN
  Administered 2023-10-28 – 2023-10-29 (×3): 600 mg via ORAL
  Filled 2023-10-28 (×3): qty 1

## 2023-10-28 MED ORDER — IPRATROPIUM-ALBUTEROL 0.5-2.5 (3) MG/3ML IN SOLN: 3.0000 mL | Freq: Two times a day (BID) | RESPIRATORY_TRACT | Status: DC

## 2023-10-28 MED ORDER — PROPRANOLOL HCL ER 60 MG PO CP24
60.0000 mg | ORAL_CAPSULE | Freq: Every day | ORAL | Status: DC
  Administered 2023-10-29 – 2023-10-30 (×2): 60 mg via ORAL
  Filled 2023-10-28 (×3): qty 1

## 2023-10-28 NOTE — Plan of Care (Signed)

## 2023-10-28 NOTE — Progress Notes (Signed)
[facility] PROGRESS NOTE    Progress Note  Daniel Terrell  ZOX:[id number] DOB: October 26, 1945 DOA: 10/27/2023 PCP: Hurshel Party, NP     Brief Narrative:   Daniel Terrell is an 78 y.o. male past medical history of tobacco abuse COPD obstructive sleep apnea recently discharged from [facility] for pulmonary mass and pleural effusion seen by Dr. Cain Saupe as an outpatient and treated with Augmentin which he is still on.  Comes into the ED with 3 to 4 days of new onset of cough green expectoration and shortness of breath fevers and chills.  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  Started on 2 L of oxygen. Tmax of 99.1 Out of bed to chair incentive spirometry flutter valve and PT OT consult. He was started empirically on IV vancomycin Rocephin and azithromycin. Blood cultures were ordered. Has remained afebrile will discontinue IV vancomycin, start doxycycline, transition Rocephin to Augmentin was previously on.  Recent history of possible lung abscess: Continue Augmentin, pulmonary wanted him a month on Augmentin. See EMG of the chest showed lung mass smaller in size seems to be responding to Augmentin  Essential hypertension Resume propranolol overdose.  Asthma: Continue Singulair.   DVT prophylaxis: lovenox Family Communication:none Status is: Observation The patient will require care spanning > 2  midnights and should be moved to inpatient because: Acute respiratory failure with hypoxia due to influenza A.    Code Status:     Code Status Orders  (From admission, onward)           Start     Ordered   10/27/23 2130  Full code  Continuous       Question:  By:  Answer:  Consent: discussion documented in EHR   10/27/23 2130           Code Status History     This patient has a current code status but no historical code status.         IV Access:   Peripheral IV   Procedures and diagnostic studies:   CT Angio Chest PE W and/or Wo Contrast Result Date: 10/27/2023 CLINICAL DATA:  Shortness of breath, fever, sore throat EXAM: CT ANGIOGRAPHY CHEST WITH CONTRAST TECHNIQUE: Multidetector CT imaging of the chest was performed using the standard protocol during bolus administration of intravenous contrast. Multiplanar CT image reconstructions and MIPs were obtained to evaluate the vascular anatomy. RADIATION DOSE REDUCTION: This exam was performed according to the departmental dose-optimization program which includes automated exposure control, adjustment of the mA and/or kV according to patient size and/or use of iterative reconstruction technique. CONTRAST:  75mL OMNIPAQUE IOHEXOL 350 MG/ML SOLN COMPARISON:  Same day chest radiograph, CTA chest dated 09/17/2023 FINDINGS: Cardiovascular: The study is high quality for the evaluation of pulmonary embolism. There are no filling defects in the central, lobar, segmental or subsegmental pulmonary artery branches to suggest acute pulmonary embolism. Ascending thoracic aorta measures 4.4 x 4.3 cm, unchanged. Normal heart size.  No significant pericardial fluid/thickening. Coronary artery calcifications and aortic atherosclerosis. Mediastinum/Nodes: Imaged thyroid gland without nodules meeting criteria for imaging follow-up by size. Normal esophagus. 1.1 cm right hilar lymph node (4:61), unchanged. Lungs/Pleura: The central airways are  patent. Moderate diffuse bronchial wall thickening with bilateral lower lobe subsegmental mucous plugging. Previously noted right lower lobe masses are nearly completely resolved with residual subpleural 2.4 x 1.1 cm rounded opacity (6:69), previously 4.3 x 3.2 cm. New 3 mm basilar right lower lobe nodule (6:77). Subpleural lingular and left lower lobe atelectasis. No pneumothorax. No pleural effusion. Upper abdomen: Subcentimeter hepatic segment 2 hypodensity (4:104), too small to characterize. Colonic diverticulosis without acute diverticulitis. Musculoskeletal: No acute or abnormal lytic or blastic osseous lesions. Review of the MIP images confirms the above findings. IMPRESSION: 1. No evidence of pulmonary embolism. 2. Moderate diffuse bronchial wall thickening with bilateral lower lobe subsegmental mucous plugging, which can be seen in the setting of bronchitis. 3. Previously noted right lower lobe masses are nearly completely resolved with residual subpleural 2.4 x 1.1 cm rounded opacity, likely resolving infection/inflammation. 4. New 3 mm basilar right lower lobe nodule, likely infectious/inflammatory. 5. Stable enlarged right hilar lymph node, likely reactive. 6. Ascending thoracic aorta measures 4.4 cm. Recommend annual imaging followup by CTA or MRA. This recommendation follows 2010 ACCF/AHA/AATS/ACR/ASA/SCA/SCAI/SIR/STS/SVM Guidelines for the Diagnosis and Management of Patients with Thoracic Aortic Disease. Circulation. 2010; 121: W119-J478. Aortic aneurysm NOS (ICD10-I71.9) 7. Aortic Atherosclerosis (ICD10-I70.0). Coronary artery calcifications. Assessment for potential risk factor modification, dietary therapy or pharmacologic therapy may be warranted, if clinically indicated. Electronically Signed   By: Agustin Cree M.D.   On: 10/27/2023 14:56   DG Chest Port 1 View Result Date: 10/27/2023 CLINICAL DATA:  Questionable sepsis.  Evaluate for abnormality. EXAM: PORTABLE CHEST 1 VIEW COMPARISON:   10/17/2023 FINDINGS: Heart size is normal. Lungs are hypoinflated no signs of pleural effusion, interstitial edema or airspace consolidation. Scar versus atelectasis noted in the left base. No mass identified. IMPRESSION: 1. Hypoinflation. 2. Left base scar versus atelectasis. 3. No mass identified. Given the normal chest radiograph from today and 10/17/2023 the subpleural masslike architectural distortion with overlying pleural effusion seen on the CT from 09/17/2023 likely represented an area of pneumonia with sub pulmonic effusion. As mentioned on 10/17/2023 a follow-up CT of the chest may be helpful to confirm complete resolution of this process and to exclude underlying malignancy. Electronically Signed   By: Signa Kell M.D.   On: 10/27/2023 13:28     Medical Consultants:   None.   Subjective:    Daniel Terrell relates he feels about the same.  Objective:    Vitals:   10/27/23 2031 10/28/23 0100 10/28/23 0355 10/28/23 0436  BP: 102/68 102/60  116/63  Pulse: 73 70  72  Resp: 16 18  18   Temp: 98.7 F (37.1 C) 98.8 F (37.1 C)  99.1 F (37.3 C)  TempSrc: Oral Oral  Oral  SpO2: 99% 99% 93% 97%  Weight:      Height:       SpO2: 97 % O2 Flow Rate (L/min): 2 L/min   Intake/Output Summary (Last 24 hours) at 10/28/2023 0732 Last data filed at 10/28/2023 0622 Gross per 24 hour  Intake 1768.48 ml  Output 450 ml  Net 1318.48 ml   Filed Weights   10/27/23 1236  Weight: 91.4 kg    Exam: General exam: In no acute distress. Respiratory system: Good air movement and clear to auscultation. Cardiovascular system:  S1 & S2 heard, RRR. No JVD. Gastrointestinal system: Abdomen is nondistended, soft and nontender.  Extremities: No pedal edema. Skin: No rashes, lesions or ulcers Psychiatry: Judgement and insight appear normal. Mood & affect appropriate.    Data Reviewed:    Labs: Basic Metabolic Panel: Recent Labs  Lab 10/27/23 1249 10/28/23 0538  NA 138 136  K 4.1  3.7  CL 103 106  CO2 25 22  GLUCOSE 99 83  BUN 29* 21  CREATININE 1.17 0.89  CALCIUM 9.8 8.4*   GFR Estimated Creatinine Clearance: 81.6 mL/min (by C-G formula based on SCr of 0.89 mg/dL). Liver Function Tests: Recent Labs  Lab 10/27/23 1249  AST 23  ALT 20  ALKPHOS 54  BILITOT 0.7  PROT 7.5  ALBUMIN 4.1   No results for input(s): "LIPASE", "AMYLASE" in the last 168 hours. No results for input(s): "AMMONIA" in the last 168 hours. Coagulation profile Recent Labs  Lab 10/27/23 1249 10/28/23 0538  INR 1.0 1.1   COVID-19 Labs  No results for input(s): "DDIMER", "FERRITIN", "LDH", "CRP" in the last 72 hours.  Lab Results  Component Value Date   SARSCOV2NAA NEGATIVE 10/27/2023    CBC: Recent Labs  Lab 10/27/23 1249 10/28/23 0538  WBC 9.1 4.3  NEUTROABS 7.4  --   HGB 15.5 11.9*  HCT 46.3 34.9*  MCV 89.9 90.2  PLT 254 225   Cardiac Enzymes: No results for input(s): "CKTOTAL", "CKMB", "CKMBINDEX", "TROPONINI" in the last 168 hours. BNP (last 3 results) No results for input(s): "PROBNP" in the last 8760 hours. CBG: No results for input(s): "GLUCAP" in the last 168 hours. D-Dimer: No results for input(s): "DDIMER" in the last 72 hours. Hgb A1c: No results for input(s): "HGBA1C" in the last 72 hours. Lipid Profile: No results for input(s): "CHOL", "HDL", "LDLCALC", "TRIG", "CHOLHDL", "LDLDIRECT" in the last 72 hours. Thyroid function studies: No results for input(s): "TSH", "T4TOTAL", "T3FREE", "THYROIDAB" in the last 72 hours.  Invalid input(s): "FREET3" Anemia work up: No results for input(s): "VITAMINB12", "FOLATE", "FERRITIN", "TIBC", "IRON", "RETICCTPCT" in the last 72 hours. Sepsis Labs: Recent Labs  Lab 10/27/23 1249 10/27/23 1430 10/28/23 0538  WBC 9.1  --  4.3  LATICACIDVEN 0.9 0.9  --    Microbiology Recent Results (from the past 240 hours)  Resp panel by RT-PCR (RSV, Flu A&B, Covid) Anterior Nasal Swab     Status: Abnormal   Collection  Time: 10/27/23 12:49 PM   Specimen: Anterior Nasal Swab  Result Value Ref Range Status   SARS Coronavirus 2 by RT PCR NEGATIVE NEGATIVE Final    Comment: (NOTE) SARS-CoV-2 target nucleic acids are NOT DETECTED.  The SARS-CoV-2 RNA is generally detectable in upper respiratory specimens during the acute phase of infection. The lowest concentration of SARS-CoV-2 viral copies this assay can detect is 138 copies/mL. A negative result does not preclude SARS-Cov-2 infection and should not be used as the sole basis for treatment or other patient management decisions. A negative result may occur with  improper specimen collection/handling, submission of specimen other than nasopharyngeal swab, presence of viral mutation(s) within the areas targeted by this assay, and inadequate number of viral copies(<138 copies/mL). A negative result must be combined with clinical observations, patient history, and epidemiological information. The expected result is Negative.  Fact Sheet for Patients:  BloggerCourse.com  Fact Sheet for Healthcare Providers:  SeriousBroker.it  This test is no t yet approved or cleared by the [place] and  has been authorized for  detection and/or diagnosis of SARS-CoV-2 by FDA under an Emergency Use Authorization (EUA). This EUA will remain  in effect (meaning this test can be used) for the duration of the COVID-19 declaration under Section 564(b)(1) of the Act, 21 U.S.C.section 360bbb-3(b)(1), unless the authorization is terminated  or revoked sooner.       Influenza A by PCR POSITIVE (A) NEGATIVE Final   Influenza B by PCR NEGATIVE NEGATIVE Final    Comment: (NOTE) The Xpert Xpress SARS-CoV-2/FLU/RSV plus assay is intended as an aid in the diagnosis of influenza from Nasopharyngeal swab specimens and should not be used as a sole basis for treatment. Nasal washings and aspirates are unacceptable for Xpert  Xpress SARS-CoV-2/FLU/RSV testing.  Fact Sheet for Patients: BloggerCourse.com  Fact Sheet for Healthcare Providers: SeriousBroker.it  This test is not yet approved or cleared by the [place] FDA and has been authorized for detection and/or diagnosis of SARS-CoV-2 by FDA under an Emergency Use Authorization (EUA). This EUA will remain in effect (meaning this test can be used) for the duration of the COVID-19 declaration under Section 564(b)(1) of the Act, 21 U.S.C. section 360bbb-3(b)(1), unless the authorization is terminated or revoked.     Resp Syncytial Virus by PCR NEGATIVE NEGATIVE Final    Comment: (NOTE) Fact Sheet for Patients: BloggerCourse.com  Fact Sheet for Healthcare Providers: SeriousBroker.it  This test is not yet approved or cleared by the [place] FDA and has been authorized for detection and/or diagnosis of SARS-CoV-2 by FDA under an Emergency Use Authorization (EUA). This EUA will remain in effect (meaning this test can be used) for the duration of the COVID-19 declaration under Section 564(b)(1) of the Act, 21 U.S.C. section 360bbb-3(b)(1), unless the authorization is terminated or revoked.  Performed at [facility], [address] [place], [place] [address]   Blood Culture (routine x 2)     Status: None (Preliminary result)   Collection Time: 10/27/23 12:54 PM   Specimen: BLOOD RIGHT WRIST  Result Value Ref Range Status   Specimen Description   Final    BLOOD RIGHT WRIST Performed at [facility], [address] [place], [place] [address]    Special Requests   Final    Blood Culture results may not be optimal due to an inadequate volume of blood received in culture bottles BOTTLES DRAWN AEROBIC AND ANAEROBIC Performed at [facility], [address] [place], [place] [address]    Culture  PENDING  Incomplete   Report Status PENDING  Incomplete     Medications:    aspirin  81 mg Oral Daily   enoxaparin (LOVENOX) injection  40 mg Subcutaneous Q24H   fluticasone furoate-vilanterol  1 puff Inhalation Daily   ipratropium-albuterol  3 mL Nebulization BID   montelukast  10 mg Oral QHS   oseltamivir  75 mg Oral BID   pantoprazole  40 mg Oral Daily   PARoxetine  60 mg Oral Daily   sodium chloride flush  3 mL Intravenous Q12H   Continuous Infusions:  cefTRIAXone (ROCEPHIN)  IV     doxycycline (VIBRAMYCIN) IV     lactated ringers 150 mL/hr at 10/28/23 0159      LOS: 0 days   Marinda Elk  [facility]  10/28/2023, 7:32 AM

## 2023-10-28 NOTE — Evaluation (Signed)
Physical Therapy Evaluation Patient Details Name: Daniel Terrell MRN: [id number] DOB: 1946-07-10 Today's Date: 10/28/2023  History of Present Illness  78 y.o. male  who was  recently discharged from [facility] for pulmonary mass and pleural effusion. pt seen in ED,  was hypoxic  transferred to [facility] from [facility], positive for influenza A .  CT angio of the chest did not show PE but did showed diffuse bronchial thickening with bilateral lower lobe mucous plugging, previously noted right lower lobe mass is almost resolved with a residual subpleural 2.4 x 1.1 rounded opacity, new minimal 3 mm right lower lobe nodule  PMH: tobacco abuse COPD , OSA  Clinical Impression  Patient evaluated by Physical Therapy with no further acute PT needs identified. All education has been completed and the patient has no further questions.  Pt doing well, feeling better but reports lack of sleep since admission. Pt amb hallway distance, no LOB, minimal dyspnea, SpO2=92-94% on RA with activity. Pt uses 2L O2 at night  Encouraged pt to continue to amb during stay  See below for any follow-up Physical Therapy or equipment needs. PT is signing off. Thank you for this referral.         If plan is discharge home, recommend the following:     Can travel by private vehicle        Equipment Recommendations None recommended by PT  Recommendations for Other Services       Functional Status Assessment Patient has not had a recent decline in their functional status     Precautions / Restrictions Restrictions Weight Bearing Restrictions Per Provider Order: No      Mobility  Bed Mobility Overal bed mobility: Modified Independent Bed Mobility: Supine to Sit, Sit to Supine     Supine to sit: Modified independent (Device/Increase time) Sit to supine: Modified independent (Device/Increase time)        Transfers Overall transfer level: Modified independent                       Ambulation/Gait Ambulation/Gait assistance: Modified independent (Device/Increase time), Supervision Gait Distance (Feet): 180 Feet Assistive device: IV Pole, None Gait Pattern/deviations: Step-through pattern          Stairs            Wheelchair Mobility     Tilt Bed    Modified Rankin (Stroke Patients Only)       Balance Overall balance assessment: Modified Independent                                           Pertinent Vitals/Pain Pain Assessment Pain Assessment: No/denies pain    Home Living Family/patient expects to be discharged to:: Private residence   Available Help at Discharge: Family Type of Home: House         Home Layout: One level Home Equipment: None      Prior Function Prior Level of Function : Independent/Modified Independent;Driving                     Extremity/Trunk Assessment   Upper Extremity Assessment Upper Extremity Assessment: Overall WFL for tasks assessed    Lower Extremity Assessment Lower Extremity Assessment: Overall WFL for tasks assessed    Cervical / Trunk Assessment Cervical / Trunk Assessment: Kyphotic  Communication   Communication Communication: No apparent difficulties  Cognition Arousal: Alert Behavior During Therapy: WFL for tasks assessed/performed Overall Cognitive Status: Within Functional Limits for tasks assessed                                          General Comments      Exercises     Assessment/Plan    PT Assessment Patient does not need any further PT services  PT Problem List         PT Treatment Interventions      PT Goals (Current goals can be found in the Care Plan section)  Acute Rehab PT Goals PT Goal Formulation: All assessment and education complete, DC therapy    Frequency       Co-evaluation               AM-PAC PT "6 Clicks" Mobility  Outcome Measure Help needed turning from your back to your side while  in a flat bed without using bedrails?: None Help needed moving from lying on your back to sitting on the side of a flat bed without using bedrails?: None Help needed moving to and from a bed to a chair (including a wheelchair)?: None Help needed standing up from a chair using your arms (e.g., wheelchair or bedside chair)?: None Help needed to walk in hospital room?: None Help needed climbing 3-5 steps with a railing? : None 6 Click Score: 24    End of Session Equipment Utilized During Treatment: Gait belt   Patient left: with call bell/phone within reach   PT Visit Diagnosis: Other abnormalities of gait and mobility (R26.89)    Time: 8119-1478 PT Time Calculation (min) (ACUTE ONLY): 19 min   Charges:   PT Evaluation $PT Eval Low Complexity: 1 Low   PT General Charges $$ ACUTE PT VISIT: 1 Visit         Antonius Hartlage, PT  Acute Rehab Dept [facility]) [phone]  10/28/2023   [facility] 10/28/2023, 1:31 PM

## 2023-10-28 NOTE — Progress Notes (Signed)
OT Cancellation Note  Patient Details Name: Daniel Terrell MRN: [id number] DOB: 1946-01-17   Cancelled Treatment:    Reason Eval/Treat Not Completed: OT screened, no needs identified, will sign off Per PT, patient is independent with ADLs in room at this time. OT to sign off. Thank you for this referral.  Rosalio Loud, MS Acute Rehabilitation Department Office# [phone]  10/28/2023, 1:42 PM

## 2023-10-29 DIAGNOSIS — J111 Influenza due to unidentified influenza virus with other respiratory manifestations: Secondary | ICD-10-CM | POA: Diagnosis not present

## 2023-10-29 DIAGNOSIS — J9601 Acute respiratory failure with hypoxia: Secondary | ICD-10-CM | POA: Diagnosis not present

## 2023-10-29 MED ORDER — LOPERAMIDE HCL 2 MG PO CAPS
2.0000 mg | ORAL_CAPSULE | Freq: Once | ORAL | Status: AC
  Administered 2023-10-29: 2 mg via ORAL
  Filled 2023-10-29: qty 1

## 2023-10-29 NOTE — Plan of Care (Incomplete)

## 2023-10-29 NOTE — Progress Notes (Signed)
Mobility Specialist - Progress Note   10/29/23 1037  Oxygen Therapy  SpO2 93 %  O2 Device Room Air  Patient Activity (if Appropriate) Ambulating  Mobility  Activity Ambulated independently in hallway  Level of Assistance Independent  Assistive Device None  Distance Ambulated (ft) 250 ft  Activity Response Tolerated well  Mobility Referral Yes  Mobility visit 1 Mobility  Mobility Specialist Start Time (ACUTE ONLY) 1028  Mobility Specialist Stop Time (ACUTE ONLY) 1036  Mobility Specialist Time Calculation (min) (ACUTE ONLY) 8 min   Pt received in bed and agreeable to mobility. No complaints during session. Pt to bed after session with all needs met.    Pre-mobility: 96% SpO2 (RA) During mobility: 93% SpO2 (RA) Post-mobility: 97% SPO2 (RA)  Chief Technology Officer

## 2023-10-29 NOTE — Progress Notes (Signed)
[facility] PROGRESS NOTE    Progress Note  Daniel Terrell  ZOX:[id number] DOB: 01-24-1946 DOA: 10/27/2023 PCP: Hurshel Party, NP     Brief Narrative:   Daniel Terrell is an 78 y.o. male past medical history of tobacco abuse COPD obstructive sleep apnea recently discharged from [facility] for pulmonary mass and pleural effusion seen by Dr. Cain Saupe as an outpatient and treated with Augmentin which he is still on.  Comes into the ED with 3 to 4 days of new onset of cough green expectoration and shortness of breath fevers and chills.  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  Started on 2 L of oxygen. Tmax 98.2. Out of bed to chair, continue sensi spirometry and flutter valve.  PT evaluated the patient no home with PT. Now on IV Unasyn and doxycycline. Blood cultures have been negative till date.  Recent history of possible lung abscess: Continue Augmentin, pulmonary wanted him a month on Augmentin. See EMG of the chest showed lung mass smaller in size seems to be responding to Augmentin.  Normocytic anemia: No evidence of bleeding follow-up PCP as an outpatient.  Essential hypertension: Resume propranolol overdose.  Asthma: Continue Singulair.   DVT prophylaxis: Lovenox Family Communication:none Status is: Observation The patient will require care spanning > 2 midnights and should be moved to  inpatient because: Acute respiratory failure with hypoxia due to influenza A.    Code Status:     Code Status Orders  (From admission, onward)           Start     Ordered   10/27/23 2130  Full code  Continuous       Question:  By:  Answer:  Consent: discussion documented in EHR   10/27/23 2130           Code Status History     This patient has a current code status but no historical code status.         IV Access:   Peripheral IV   Procedures and diagnostic studies:   CT Angio Chest PE W and/or Wo Contrast Result Date: 10/27/2023 CLINICAL DATA:  Shortness of breath, fever, sore throat EXAM: CT ANGIOGRAPHY CHEST WITH CONTRAST TECHNIQUE: Multidetector CT imaging of the chest was performed using the standard protocol during bolus administration of intravenous contrast. Multiplanar CT image reconstructions and MIPs were obtained to evaluate the vascular anatomy. RADIATION DOSE REDUCTION: This exam was performed according to the departmental dose-optimization program which includes automated exposure control, adjustment of the mA and/or kV according to patient size and/or use of iterative reconstruction technique. CONTRAST:  75mL OMNIPAQUE IOHEXOL 350 MG/ML SOLN COMPARISON:  Same day chest radiograph, CTA chest dated 09/17/2023 FINDINGS: Cardiovascular: The study is high quality for the evaluation of pulmonary embolism. There are no filling defects in the central, lobar, segmental or subsegmental pulmonary artery branches to suggest acute pulmonary embolism. Ascending thoracic aorta measures 4.4 x 4.3 cm, unchanged. Normal heart  size. No significant pericardial fluid/thickening. Coronary artery calcifications and aortic atherosclerosis. Mediastinum/Nodes: Imaged thyroid gland without nodules meeting criteria for imaging follow-up by size. Normal esophagus. 1.1 cm right hilar lymph node (4:61), unchanged. Lungs/Pleura: The central airways are patent. Moderate diffuse bronchial wall  thickening with bilateral lower lobe subsegmental mucous plugging. Previously noted right lower lobe masses are nearly completely resolved with residual subpleural 2.4 x 1.1 cm rounded opacity (6:69), previously 4.3 x 3.2 cm. New 3 mm basilar right lower lobe nodule (6:77). Subpleural lingular and left lower lobe atelectasis. No pneumothorax. No pleural effusion. Upper abdomen: Subcentimeter hepatic segment 2 hypodensity (4:104), too small to characterize. Colonic diverticulosis without acute diverticulitis. Musculoskeletal: No acute or abnormal lytic or blastic osseous lesions. Review of the MIP images confirms the above findings. IMPRESSION: 1. No evidence of pulmonary embolism. 2. Moderate diffuse bronchial wall thickening with bilateral lower lobe subsegmental mucous plugging, which can be seen in the setting of bronchitis. 3. Previously noted right lower lobe masses are nearly completely resolved with residual subpleural 2.4 x 1.1 cm rounded opacity, likely resolving infection/inflammation. 4. New 3 mm basilar right lower lobe nodule, likely infectious/inflammatory. 5. Stable enlarged right hilar lymph node, likely reactive. 6. Ascending thoracic aorta measures 4.4 cm. Recommend annual imaging followup by CTA or MRA. This recommendation follows 2010 ACCF/AHA/AATS/ACR/ASA/SCA/SCAI/SIR/STS/SVM Guidelines for the Diagnosis and Management of Patients with Thoracic Aortic Disease. Circulation. 2010; 121: Z308-M578. Aortic aneurysm NOS (ICD10-I71.9) 7. Aortic Atherosclerosis (ICD10-I70.0). Coronary artery calcifications. Assessment for potential risk factor modification, dietary therapy or pharmacologic therapy may be warranted, if clinically indicated. Electronically Signed   By: Agustin Cree M.D.   On: 10/27/2023 14:56   DG Chest Port 1 View Result Date: 10/27/2023 CLINICAL DATA:  Questionable sepsis.  Evaluate for abnormality. EXAM: PORTABLE CHEST 1 VIEW COMPARISON:  10/17/2023 FINDINGS: Heart size is normal.  Lungs are hypoinflated no signs of pleural effusion, interstitial edema or airspace consolidation. Scar versus atelectasis noted in the left base. No mass identified. IMPRESSION: 1. Hypoinflation. 2. Left base scar versus atelectasis. 3. No mass identified. Given the normal chest radiograph from today and 10/17/2023 the subpleural masslike architectural distortion with overlying pleural effusion seen on the CT from 09/17/2023 likely represented an area of pneumonia with sub pulmonic effusion. As mentioned on 10/17/2023 a follow-up CT of the chest may be helpful to confirm complete resolution of this process and to exclude underlying malignancy. Electronically Signed   By: Signa Kell M.D.   On: 10/27/2023 13:28     Medical Consultants:   None.   Subjective:    Laurence Ferrari relates his breathing is better.  Objective:    Vitals:   10/28/23 1455 10/28/23 2020 10/28/23 2148 10/29/23 0547  BP:   (!) 138/98 (!) 146/79  Pulse:   94 70  Resp:   18 16  Temp:   97.9 F (36.6 C) 97.6 F (36.4 C)  TempSrc:   Oral Oral  SpO2: 98% 99% 95% 99%  Weight:      Height:       SpO2: 99 % O2 Flow Rate (L/min): 3 L/min   Intake/Output Summary (Last 24 hours) at 10/29/2023 0901 Last data filed at 10/29/2023 0550 Gross per 24 hour  Intake 480 ml  Output 1200 ml  Net -720 ml   Filed Weights   10/27/23 1236  Weight: 91.4 kg    Exam: General exam: In no acute distress. Respiratory system: Good air movement and clear to auscultation. Cardiovascular system: S1 &  S2 heard, RRR. No JVD. Gastrointestinal system: Abdomen is nondistended, soft and nontender.  Extremities: No pedal edema. Skin: No rashes, lesions or ulcers Psychiatry: Judgement and insight appear normal. Mood & affect appropriate.  Data Reviewed:    Labs: Basic Metabolic Panel: Recent Labs  Lab 10/27/23 1249 10/28/23 0538  NA 138 136  K 4.1 3.7  CL 103 106  CO2 25 22  GLUCOSE 99 83  BUN 29* 21  CREATININE 1.17  0.89  CALCIUM 9.8 8.4*   GFR Estimated Creatinine Clearance: 81.6 mL/min (by C-G formula based on SCr of 0.89 mg/dL). Liver Function Tests: Recent Labs  Lab 10/27/23 1249  AST 23  ALT 20  ALKPHOS 54  BILITOT 0.7  PROT 7.5  ALBUMIN 4.1   No results for input(s): "LIPASE", "AMYLASE" in the last 168 hours. No results for input(s): "AMMONIA" in the last 168 hours. Coagulation profile Recent Labs  Lab 10/27/23 1249 10/28/23 0538  INR 1.0 1.1   COVID-19 Labs  No results for input(s): "DDIMER", "FERRITIN", "LDH", "CRP" in the last 72 hours.  Lab Results  Component Value Date   SARSCOV2NAA NEGATIVE 10/27/2023    CBC: Recent Labs  Lab 10/27/23 1249 10/28/23 0538  WBC 9.1 4.3  NEUTROABS 7.4  --   HGB 15.5 11.9*  HCT 46.3 34.9*  MCV 89.9 90.2  PLT 254 225   Cardiac Enzymes: No results for input(s): "CKTOTAL", "CKMB", "CKMBINDEX", "TROPONINI" in the last 168 hours. BNP (last 3 results) No results for input(s): "PROBNP" in the last 8760 hours. CBG: No results for input(s): "GLUCAP" in the last 168 hours. D-Dimer: No results for input(s): "DDIMER" in the last 72 hours. Hgb A1c: No results for input(s): "HGBA1C" in the last 72 hours. Lipid Profile: No results for input(s): "CHOL", "HDL", "LDLCALC", "TRIG", "CHOLHDL", "LDLDIRECT" in the last 72 hours. Thyroid function studies: No results for input(s): "TSH", "T4TOTAL", "T3FREE", "THYROIDAB" in the last 72 hours.  Invalid input(s): "FREET3" Anemia work up: No results for input(s): "VITAMINB12", "FOLATE", "FERRITIN", "TIBC", "IRON", "RETICCTPCT" in the last 72 hours. Sepsis Labs: Recent Labs  Lab 10/27/23 1249 10/27/23 1430 10/28/23 0538  WBC 9.1  --  4.3  LATICACIDVEN 0.9 0.9  --    Microbiology Recent Results (from the past 240 hours)  Resp panel by RT-PCR (RSV, Flu A&B, Covid) Anterior Nasal Swab     Status: Abnormal   Collection Time: 10/27/23 12:49 PM   Specimen: Anterior Nasal Swab  Result Value Ref  Range Status   SARS Coronavirus 2 by RT PCR NEGATIVE NEGATIVE Final    Comment: (NOTE) SARS-CoV-2 target nucleic acids are NOT DETECTED.  The SARS-CoV-2 RNA is generally detectable in upper respiratory specimens during the acute phase of infection. The lowest concentration of SARS-CoV-2 viral copies this assay can detect is 138 copies/mL. A negative result does not preclude SARS-Cov-2 infection and should not be used as the sole basis for treatment or other patient management decisions. A negative result may occur with  improper specimen collection/handling, submission of specimen other than nasopharyngeal swab, presence of viral mutation(s) within the areas targeted by this assay, and inadequate number of viral copies(<138 copies/mL). A negative result must be combined with clinical observations, patient history, and epidemiological information. The expected result is Negative.  Fact Sheet for Patients:  BloggerCourse.com  Fact Sheet for Healthcare Providers:  SeriousBroker.it  This test is no t yet approved or cleared by the [place] FDA and  has been authorized for detection and/or diagnosis of  SARS-CoV-2 by FDA under an Emergency Use Authorization (EUA). This EUA will remain  in effect (meaning this test can be used) for the duration of the COVID-19 declaration under Section 564(b)(1) of the Act, 21 U.S.C.section 360bbb-3(b)(1), unless the authorization is terminated  or revoked sooner.       Influenza A by PCR POSITIVE (A) NEGATIVE Final   Influenza B by PCR NEGATIVE NEGATIVE Final    Comment: (NOTE) The Xpert Xpress SARS-CoV-2/FLU/RSV plus assay is intended as an aid in the diagnosis of influenza from Nasopharyngeal swab specimens and should not be used as a sole basis for treatment. Nasal washings and aspirates are unacceptable for Xpert Xpress SARS-CoV-2/FLU/RSV testing.  Fact Sheet for  Patients: BloggerCourse.com  Fact Sheet for Healthcare Providers: SeriousBroker.it  This test is not yet approved or cleared by the [place] FDA and has been authorized for detection and/or diagnosis of SARS-CoV-2 by FDA under an Emergency Use Authorization (EUA). This EUA will remain in effect (meaning this test can be used) for the duration of the COVID-19 declaration under Section 564(b)(1) of the Act, 21 U.S.C. section 360bbb-3(b)(1), unless the authorization is terminated or revoked.     Resp Syncytial Virus by PCR NEGATIVE NEGATIVE Final    Comment: (NOTE) Fact Sheet for Patients: BloggerCourse.com  Fact Sheet for Healthcare Providers: SeriousBroker.it  This test is not yet approved or cleared by the [place] FDA and has been authorized for detection and/or diagnosis of SARS-CoV-2 by FDA under an Emergency Use Authorization (EUA). This EUA will remain in effect (meaning this test can be used) for the duration of the COVID-19 declaration under Section 564(b)(1) of the Act, 21 U.S.C. section 360bbb-3(b)(1), unless the authorization is terminated or revoked.  Performed at [facility], [address] [place], [place] [address]   Blood Culture (routine x 2)     Status: None (Preliminary result)   Collection Time: 10/27/23 12:49 PM   Specimen: BLOOD  Result Value Ref Range Status   Specimen Description   Final    BLOOD LEFT ANTECUBITAL Performed at [facility], [address] [place], [place] [address]    Special Requests   Final    Blood Culture results may not be optimal due to an inadequate volume of blood received in culture bottles BOTTLES DRAWN AEROBIC AND ANAEROBIC Performed at [facility], [address], [place], [place] [address]    Culture   Final    NO GROWTH 2 DAYS Performed at [facility], [address] [place], [place] [address]    Report Status PENDING  Incomplete  Blood Culture (routine x 2)     Status: None (Preliminary result)   Collection Time: 10/27/23 12:54 PM   Specimen: BLOOD RIGHT WRIST  Result Value Ref Range Status   Specimen Description   Final    BLOOD RIGHT WRIST Performed at [facility], [address] [place], [place] [address]    Special Requests   Final    Blood Culture results may not be optimal due to an inadequate volume of blood received in culture bottles BOTTLES DRAWN AEROBIC AND ANAEROBIC Performed at [facility], [address] [place], [place] [address]    Culture   Final    NO GROWTH 2 DAYS Performed at [facility], [address], [place], [place] [address]    Report Status PENDING  Incomplete     Medications:    albuterol  2.5 mg  Nebulization TID   amoxicillin-clavulanate  1 tablet Oral Q12H   aspirin EC  81 mg Oral Daily   doxycycline  100 mg Oral Q12H   enoxaparin (LOVENOX) injection  40 mg Subcutaneous Q24H   fluticasone furoate-vilanterol  1 puff Inhalation Daily   And   umeclidinium bromide  1 puff Inhalation Daily   montelukast  10 mg Oral QHS   oseltamivir  75 mg Oral BID   pantoprazole  40 mg Oral Daily   PARoxetine  60 mg Oral Daily   polyethylene glycol  17 g Oral Daily   propranolol ER  60 mg Oral Daily   sodium chloride flush  3 mL Intravenous Q12H   Continuous Infusions:      LOS: 1 day   Marinda Elk  [facility]  10/29/2023, 9:01 AM

## 2023-10-30 ENCOUNTER — Telehealth: Payer: Self-pay | Admitting: Pulmonary Disease

## 2023-10-30 ENCOUNTER — Other Ambulatory Visit (HOSPITAL_BASED_OUTPATIENT_CLINIC_OR_DEPARTMENT_OTHER): Payer: PPO | Admitting: Radiology

## 2023-10-30 ENCOUNTER — Other Ambulatory Visit (HOSPITAL_COMMUNITY): Payer: Self-pay

## 2023-10-30 DIAGNOSIS — J9601 Acute respiratory failure with hypoxia: Secondary | ICD-10-CM | POA: Diagnosis not present

## 2023-10-30 DIAGNOSIS — J101 Influenza due to other identified influenza virus with other respiratory manifestations: Secondary | ICD-10-CM | POA: Diagnosis not present

## 2023-10-30 DIAGNOSIS — J449 Chronic obstructive pulmonary disease, unspecified: Secondary | ICD-10-CM

## 2023-10-30 MED ORDER — OSELTAMIVIR PHOSPHATE 75 MG PO CAPS
75.0000 mg | ORAL_CAPSULE | Freq: Two times a day (BID) | ORAL | 0 refills | Status: AC
  Filled 2023-10-30: qty 2, 1d supply, fill #0

## 2023-10-30 MED ORDER — ALBUTEROL SULFATE (2.5 MG/3ML) 0.083% IN NEBU: 2.5000 mg | INHALATION_SOLUTION | Freq: Two times a day (BID) | RESPIRATORY_TRACT | Status: DC

## 2023-10-30 MED ORDER — ALBUTEROL SULFATE (2.5 MG/3ML) 0.083% IN NEBU: 2.5000 mg | INHALATION_SOLUTION | Freq: Four times a day (QID) | RESPIRATORY_TRACT | Status: DC | PRN

## 2023-10-30 NOTE — Progress Notes (Signed)
AVS reviewed w/ pt  who verbalized an understanding. No other questions at this time- waiting on TOC med. PIV removed as noted. Pt dressing for d/c to home. Pt's son enroute.

## 2023-10-30 NOTE — Telephone Encounter (Signed)
Patient states needs PET scan for lung nodules. Patient phone number is [phone].

## 2023-10-30 NOTE — Discharge Summary (Signed)
Physician Discharge Summary  MUNEER LEIDER ZOX:[id number] DOB: 02/25/1946 DOA: 10/27/2023  PCP: Hurshel Party, NP  Admit date: 10/27/2023 Discharge date: 10/30/2023  Admitted From: Home Disposition:  home  Recommendations for Outpatient Follow-up:  Follow up with pulmonary in 1-2 weeks Please obtain BMP/CBC in one week Will need to follow-up CT of the ascending thoracic aorta due to a 4.4 cm aneurysm .  Home Health:no Equipment/Devices:None  Discharge Condition:Stable CODE STATUS:Full Diet recommendation: Heart Healthy   Brief/Interim Summary: 78 y.o. male past medical history of tobacco abuse COPD obstructive sleep apnea recently discharged from [facility] for pulmonary mass and pleural effusion seen by Dr. Cain Saupe as an outpatient and treated with Augmentin which he is still on.  Comes into the ED with 3 to 4 days of new onset of cough green expectoration and shortness of breath fevers and chills.  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 He defervesced leukocytosis improved. He will continue antibiotics Unasyn and doxycycline follow-up with pulmonary as an outpatient. Blood cultures remain negative till date.  Recent history of possible lung abscess: He was continue on antibiotics Augmentin and Doxy he is to see  his pulmonary doctor as an outpatient. He had a repeat CT scan that showed smaller lung mass in size and smaller lymphadenopathy.  Normocytic anemia: Follow-up PCP as an outpatient.  Essential hypertension: Continue propranolol.  Essential tremors: Continue propranolol  Discharge Instructions  Discharge Instructions     Diet - low sodium heart healthy   Complete by: As directed    Increase activity slowly   Complete by: As directed       Allergies as of 10/30/2023       Reactions   Levofloxacin    REACTION: disorientation Other Reaction(s): confusion   Moxifloxacin    Other Reaction(s): confusion   Ofloxacin    REACTION: disorientation Other Reaction(s): confusion Other Reaction(s): confusion, confusion, confusion   Rosuvastatin    Other Reaction(s): myalgias (muscle pain)   Topiramate Other (See Comments)   drowsy        Medication List     STOP taking these medications    albuterol 108 (90 Base) MCG/ACT inhaler Commonly known as: VENTOLIN HFA   Armodafinil 250 MG tablet   hyoscyamine 0.125 MG SL tablet Commonly known as: Levsin/SL   ibuprofen 800 MG tablet Commonly known as: ADVIL   meloxicam 15 MG tablet Commonly known as: MOBIC       TAKE these medications    amLODipine 5 MG tablet Commonly known as: NORVASC Take 1 tablet (5 mg total) by mouth daily for blood pressure.   amoxicillin-clavulanate 875-125 MG tablet Commonly known as: AUGMENTIN Take 1 tablet by mouth 2 (two) times daily.   aspirin 81 MG tablet Take 81 mg by mouth daily.   cetirizine 10 MG tablet Commonly known as: ZYRTEC Take 10 mg by mouth  daily.   doxycycline 100 MG capsule Commonly known as: VIBRAMYCIN Take 100 mg by mouth 2 (two) times daily.   Fenofibric Acid 135 MG Cpdr Take 1 capsule (135 mg total) by mouth daily for cholesterol   fluticasone 50 MCG/ACT nasal spray Commonly known as: FLONASE Place 2 sprays into both nostrils daily. What changed: when to  take this   glucosamine-chondroitin 500-400 MG tablet Take 1 tablet by mouth 2 (two) times daily.   ipratropium-albuterol 0.5-2.5 (3) MG/3ML Soln Commonly known as: DUONEB Take 3 mLs by nebulization.   montelukast 10 MG tablet Commonly known as: Singulair 1 tab by mouth daily   MULTIVITAL PO Take 1 tablet by mouth daily.   omeprazole 20 MG capsule Commonly known as: PRILOSEC Take 1 capsule (20 mg total) by mouth daily.   oseltamivir 75 MG capsule Commonly known as: TAMIFLU Take 1 capsule (75 mg total) by mouth 2 (two) times daily for 1 day.   OXYGEN Take 2 L by mouth at bedtime. Uses with CPAP face mask and then as needed during the daytime.   PARoxetine 30 MG tablet Commonly known as: PAXIL Take 2 tablets (60 mg total) by mouth daily.   propranolol ER 80 MG 24 hr capsule Commonly known as: INDERAL LA Take 1 capsule (80 mg total) by mouth daily. What changed: additional instructions   Trelegy Ellipta 100-62.5-25 MCG/ACT Aepb Generic drug: Fluticasone-Umeclidin-Vilant Inhale 1 puff into the lungs daily. What changed: when to take this   VITAMIN D PO Take 5,000 Units by mouth daily.        Allergies  Allergen Reactions   Levofloxacin     REACTION: disorientation  Other Reaction(s): confusion   Moxifloxacin     Other Reaction(s): confusion   Ofloxacin     REACTION: disorientation  Other Reaction(s): confusion  Other Reaction(s): confusion, confusion, confusion   Rosuvastatin     Other Reaction(s): myalgias (muscle pain)   Topiramate Other (See Comments)    drowsy    Consultations: None  Procedures/Studies: CT Angio Chest PE W and/or Wo Contrast Result Date: 10/27/2023 CLINICAL DATA:  Shortness of breath, fever, sore throat EXAM: CT ANGIOGRAPHY CHEST WITH CONTRAST TECHNIQUE: Multidetector CT imaging of the chest was performed using the standard protocol during bolus administration of intravenous contrast. Multiplanar CT image reconstructions and  MIPs were obtained to evaluate the vascular anatomy. RADIATION DOSE REDUCTION: This exam was performed according to the departmental dose-optimization program which includes automated exposure control, adjustment of the mA and/or kV according to patient size and/or use of iterative reconstruction technique. CONTRAST:  75mL OMNIPAQUE IOHEXOL 350 MG/ML SOLN COMPARISON:  Same day chest radiograph, CTA chest dated 09/17/2023 FINDINGS: Cardiovascular: The study is high quality for the evaluation of pulmonary embolism. There are no filling defects in the central, lobar, segmental or subsegmental pulmonary artery branches to suggest acute pulmonary embolism. Ascending thoracic aorta measures 4.4 x 4.3 cm, unchanged. Normal heart size. No significant pericardial fluid/thickening. Coronary artery calcifications and aortic atherosclerosis. Mediastinum/Nodes: Imaged thyroid gland without nodules meeting criteria for imaging follow-up by size. Normal esophagus. 1.1 cm right hilar lymph node (4:61), unchanged. Lungs/Pleura: The central airways are patent. Moderate diffuse bronchial wall thickening with bilateral lower lobe subsegmental mucous plugging. Previously noted right lower lobe masses are nearly completely resolved with residual subpleural 2.4 x 1.1 cm rounded opacity (6:69), previously 4.3 x 3.2 cm. New 3 mm basilar right lower lobe nodule (6:77). Subpleural lingular and left lower lobe atelectasis. No pneumothorax. No pleural effusion.  Upper abdomen: Subcentimeter hepatic segment 2 hypodensity (4:104), too small to characterize. Colonic diverticulosis without acute diverticulitis. Musculoskeletal: No acute or abnormal lytic or blastic osseous lesions. Review of the MIP images confirms the above findings. IMPRESSION: 1. No evidence of pulmonary embolism. 2. Moderate diffuse bronchial wall thickening with bilateral lower lobe subsegmental mucous plugging, which can be seen in the setting of bronchitis. 3. Previously  noted right lower lobe masses are nearly completely resolved with residual subpleural 2.4 x 1.1 cm rounded opacity, likely resolving infection/inflammation. 4. New 3 mm basilar right lower lobe nodule, likely infectious/inflammatory. 5. Stable enlarged right hilar lymph node, likely reactive. 6. Ascending thoracic aorta measures 4.4 cm. Recommend annual imaging followup by CTA or MRA. This recommendation follows 2010 ACCF/AHA/AATS/ACR/ASA/SCA/SCAI/SIR/STS/SVM Guidelines for the Diagnosis and Management of Patients with Thoracic Aortic Disease. Circulation. 2010; 121: W098-J191. Aortic aneurysm NOS (ICD10-I71.9) 7. Aortic Atherosclerosis (ICD10-I70.0). Coronary artery calcifications. Assessment for potential risk factor modification, dietary therapy or pharmacologic therapy may be warranted, if clinically indicated. Electronically Signed   By: Agustin Cree M.D.   On: 10/27/2023 14:56   DG Chest Port 1 View Result Date: 10/27/2023 CLINICAL DATA:  Questionable sepsis.  Evaluate for abnormality. EXAM: PORTABLE CHEST 1 VIEW COMPARISON:  10/17/2023 FINDINGS: Heart size is normal. Lungs are hypoinflated no signs of pleural effusion, interstitial edema or airspace consolidation. Scar versus atelectasis noted in the left base. No mass identified. IMPRESSION: 1. Hypoinflation. 2. Left base scar versus atelectasis. 3. No mass identified. Given the normal chest radiograph from today and 10/17/2023 the subpleural masslike architectural distortion with overlying pleural effusion seen on the CT from 09/17/2023 likely represented an area of pneumonia with sub pulmonic effusion. As mentioned on 10/17/2023 a follow-up CT of the chest may be helpful to confirm complete resolution of this process and to exclude underlying malignancy. Electronically Signed   By: Signa Kell M.D.   On: 10/27/2023 13:28   DG Chest 2 View Result Date: 10/21/2023 CLINICAL DATA:  Possible abscess versus mass EXAM: CHEST - 2 VIEW COMPARISON:  CT chest  08/28/2023; chest radiograph 09/17/2023 FINDINGS: Stable cardiac and mediastinal contours. Mild elevation right hemidiaphragm. No large area pulmonary consolidation. No pleural effusion or pneumothorax. Midthoracic spine degenerative changes. IMPRESSION: No definite large area pulmonary consolidation. Given the location of the suspected mass on prior CT, recommend follow-up chest CT to ensure resolution of the finding. Electronically Signed   By: Annia Belt M.D.   On: 10/21/2023 10:47   (Echo, Carotid, EGD, Colonoscopy, ERCP)    Subjective: Relates he feels great Discharge Exam: Vitals:   10/30/23 0924 10/30/23 0929  BP:    Pulse:    Resp:    Temp:    SpO2: 97% 98%   Vitals:   10/29/23 2309 10/30/23 0654 10/30/23 0924 10/30/23 0929  BP: (!) 137/95 (!) 160/94    Pulse: 62 65    Resp: 20 18    Temp: (!) 97.4 F (36.3 C) (!) 97.5 F (36.4 C)    TempSrc: Oral Oral    SpO2: 98% 99% 97% 98%  Weight:      Height:        General: Pt is alert, awake, not in acute distress Cardiovascular: RRR, S1/S2 +, no rubs, no gallops Respiratory: CTA bilaterally, no wheezing, no rhonchi Abdominal: Soft, NT, ND, bowel sounds + Extremities: no edema, no cyanosis    The results of significant diagnostics from this hospitalization (including imaging, microbiology, ancillary and laboratory) are listed below for reference.  Microbiology: Recent Results (from the past 240 hours)  Resp panel by RT-PCR (RSV, Flu A&B, Covid) Anterior Nasal Swab     Status: Abnormal   Collection Time: 10/27/23 12:49 PM   Specimen: Anterior Nasal Swab  Result Value Ref Range Status   SARS Coronavirus 2 by RT PCR NEGATIVE NEGATIVE Final    Comment: (NOTE) SARS-CoV-2 target nucleic acids are NOT DETECTED.  The SARS-CoV-2 RNA is generally detectable in upper respiratory specimens during the acute phase of infection. The lowest concentration of SARS-CoV-2 viral copies this assay can detect is 138 copies/mL. A  negative result does not preclude SARS-Cov-2 infection and should not be used as the sole basis for treatment or other patient management decisions. A negative result may occur with  improper specimen collection/handling, submission of specimen other than nasopharyngeal swab, presence of viral mutation(s) within the areas targeted by this assay, and inadequate number of viral copies(<138 copies/mL). A negative result must be combined with clinical observations, patient history, and epidemiological information. The expected result is Negative.  Fact Sheet for Patients:  BloggerCourse.com  Fact Sheet for Healthcare Providers:  SeriousBroker.it  This test is no t yet approved or cleared by the [place] FDA and  has been authorized for detection and/or diagnosis of SARS-CoV-2 by FDA under an Emergency Use Authorization (EUA). This EUA will remain  in effect (meaning this test can be used) for the duration of the COVID-19 declaration under Section 564(b)(1) of the Act, 21 U.S.C.section 360bbb-3(b)(1), unless the authorization is terminated  or revoked sooner.       Influenza A by PCR POSITIVE (A) NEGATIVE Final   Influenza B by PCR NEGATIVE NEGATIVE Final    Comment: (NOTE) The Xpert Xpress SARS-CoV-2/FLU/RSV plus assay is intended as an aid in the diagnosis of influenza from Nasopharyngeal swab specimens and should not be used as a sole basis for treatment. Nasal washings and aspirates are unacceptable for Xpert Xpress SARS-CoV-2/FLU/RSV testing.  Fact Sheet for Patients: BloggerCourse.com  Fact Sheet for Healthcare Providers: SeriousBroker.it  This test is not yet approved or cleared by the [place] FDA and has been authorized for detection and/or diagnosis of SARS-CoV-2 by FDA under an Emergency Use Authorization (EUA). This EUA will remain in effect (meaning this test  can be used) for the duration of the COVID-19 declaration under Section 564(b)(1) of the Act, 21 U.S.C. section 360bbb-3(b)(1), unless the authorization is terminated or revoked.     Resp Syncytial Virus by PCR NEGATIVE NEGATIVE Final    Comment: (NOTE) Fact Sheet for Patients: BloggerCourse.com  Fact Sheet for Healthcare Providers: SeriousBroker.it  This test is not yet approved or cleared by the [place] FDA and has been authorized for detection and/or diagnosis of SARS-CoV-2 by FDA under an Emergency Use Authorization (EUA). This EUA will remain in effect (meaning this test can be used) for the duration of the COVID-19 declaration under Section 564(b)(1) of the Act, 21 U.S.C. section 360bbb-3(b)(1), unless the authorization is terminated or revoked.  Performed at [facility], [address], [place], [place] [address]   Blood Culture (routine x 2)     Status: None (Preliminary result)   Collection Time: 10/27/23 12:49 PM   Specimen: BLOOD  Result Value Ref Range Status   Specimen Description   Final    BLOOD LEFT ANTECUBITAL Performed at [facility], [address], [place], [place] [address]    Special Requests   Final    Blood Culture results may not  be optimal due to an inadequate volume of blood received in culture bottles BOTTLES DRAWN AEROBIC AND ANAEROBIC Performed at [facility], [address], [place], [place] [address]    Culture   Final    NO GROWTH 3 DAYS Performed at [facility], [address], [place], [place] [address]    Report Status PENDING  Incomplete  Blood Culture (routine x 2)     Status: None (Preliminary result)   Collection Time: 10/27/23 12:54 PM   Specimen: BLOOD RIGHT WRIST  Result Value Ref Range Status   Specimen Description   Final    BLOOD RIGHT WRIST Performed at [facility], [address]  [place], [place] [address]    Special Requests   Final    Blood Culture results may not be optimal due to an inadequate volume of blood received in culture bottles BOTTLES DRAWN AEROBIC AND ANAEROBIC Performed at [facility], [address] [place], [place] [address]    Culture   Final    NO GROWTH 3 DAYS Performed at [facility], [address] [place], [place] [address]    Report Status PENDING  Incomplete     Labs: BNP (last 3 results) No results for input(s): "BNP" in the last 8760 hours. Basic Metabolic Panel: Recent Labs  Lab 10/27/23 1249 10/28/23 0538  NA 138 136  K 4.1 3.7  CL 103 106  CO2 25 22  GLUCOSE 99 83  BUN 29* 21  CREATININE 1.17 0.89  CALCIUM 9.8 8.4*   Liver Function Tests: Recent Labs  Lab 10/27/23 1249  AST 23  ALT 20  ALKPHOS 54  BILITOT 0.7  PROT 7.5  ALBUMIN 4.1   No results for input(s): "LIPASE", "AMYLASE" in the last 168 hours. No results for input(s): "AMMONIA" in the last 168 hours. CBC: Recent Labs  Lab 10/27/23 1249 10/28/23 0538  WBC 9.1 4.3  NEUTROABS 7.4  --   HGB 15.5 11.9*  HCT 46.3 34.9*  MCV 89.9 90.2  PLT 254 225   Cardiac Enzymes: No results for input(s): "CKTOTAL", "CKMB", "CKMBINDEX", "TROPONINI" in the last 168 hours. BNP: Invalid input(s): "POCBNP" CBG: No results for input(s): "GLUCAP" in the last 168 hours. D-Dimer No results for input(s): "DDIMER" in the last 72 hours. Hgb A1c No results for input(s): "HGBA1C" in the last 72 hours. Lipid Profile No results for input(s): "CHOL", "HDL", "LDLCALC", "TRIG", "CHOLHDL", "LDLDIRECT" in the last 72 hours. Thyroid function studies No results for input(s): "TSH", "T4TOTAL", "T3FREE", "THYROIDAB" in the last 72 hours.  Invalid input(s): "FREET3" Anemia work up No results for input(s): "VITAMINB12", "FOLATE", "FERRITIN", "TIBC", "IRON", "RETICCTPCT" in the last 72 hours. Urinalysis    Component Value Date/Time   APPEARANCEUR Clear  11/09/2020 1409   GLUCOSEU Negative 11/09/2020 1409   BILIRUBINUR Negative 11/09/2020 1409   PROTEINUR Negative 11/09/2020 1409   NITRITE Negative 11/09/2020 1409   LEUKOCYTESUR Negative 11/09/2020 1409   Sepsis Labs Recent Labs  Lab 10/27/23 1249 10/28/23 0538  WBC 9.1 4.3   Microbiology Recent Results (from the past 240 hours)  Resp panel by RT-PCR (RSV, Flu A&B, Covid) Anterior Nasal Swab     Status: Abnormal   Collection Time: 10/27/23 12:49 PM   Specimen: Anterior Nasal Swab  Result Value Ref Range Status   SARS Coronavirus 2 by RT PCR NEGATIVE NEGATIVE Final    Comment: (NOTE) SARS-CoV-2 target nucleic acids are NOT DETECTED.  The SARS-CoV-2 RNA is  generally detectable in upper respiratory specimens during the acute phase of infection. The lowest concentration of SARS-CoV-2 viral copies this assay can detect is 138 copies/mL. A negative result does not preclude SARS-Cov-2 infection and should not be used as the sole basis for treatment or other patient management decisions. A negative result may occur with  improper specimen collection/handling, submission of specimen other than nasopharyngeal swab, presence of viral mutation(s) within the areas targeted by this assay, and inadequate number of viral copies(<138 copies/mL). A negative result must be combined with clinical observations, patient history, and epidemiological information. The expected result is Negative.  Fact Sheet for Patients:  BloggerCourse.com  Fact Sheet for Healthcare Providers:  SeriousBroker.it  This test is no t yet approved or cleared by the [place] FDA and  has been authorized for detection and/or diagnosis of SARS-CoV-2 by FDA under an Emergency Use Authorization (EUA). This EUA will remain  in effect (meaning this test can be used) for the duration of the COVID-19 declaration under Section 564(b)(1) of the Act, 21 U.S.C.section  360bbb-3(b)(1), unless the authorization is terminated  or revoked sooner.       Influenza A by PCR POSITIVE (A) NEGATIVE Final   Influenza B by PCR NEGATIVE NEGATIVE Final    Comment: (NOTE) The Xpert Xpress SARS-CoV-2/FLU/RSV plus assay is intended as an aid in the diagnosis of influenza from Nasopharyngeal swab specimens and should not be used as a sole basis for treatment. Nasal washings and aspirates are unacceptable for Xpert Xpress SARS-CoV-2/FLU/RSV testing.  Fact Sheet for Patients: BloggerCourse.com  Fact Sheet for Healthcare Providers: SeriousBroker.it  This test is not yet approved or cleared by the [place] FDA and has been authorized for detection and/or diagnosis of SARS-CoV-2 by FDA under an Emergency Use Authorization (EUA). This EUA will remain in effect (meaning this test can be used) for the duration of the COVID-19 declaration under Section 564(b)(1) of the Act, 21 U.S.C. section 360bbb-3(b)(1), unless the authorization is terminated or revoked.     Resp Syncytial Virus by PCR NEGATIVE NEGATIVE Final    Comment: (NOTE) Fact Sheet for Patients: BloggerCourse.com  Fact Sheet for Healthcare Providers: SeriousBroker.it  This test is not yet approved or cleared by the [place] FDA and has been authorized for detection and/or diagnosis of SARS-CoV-2 by FDA under an Emergency Use Authorization (EUA). This EUA will remain in effect (meaning this test can be used) for the duration of the COVID-19 declaration under Section 564(b)(1) of the Act, 21 U.S.C. section 360bbb-3(b)(1), unless the authorization is terminated or revoked.  Performed at [facility], [address] [place], [place] [address]   Blood Culture (routine x 2)     Status: None (Preliminary result)   Collection Time: 10/27/23 12:49 PM   Specimen: BLOOD  Result  Value Ref Range Status   Specimen Description   Final    BLOOD LEFT ANTECUBITAL Performed at [facility], [address], [place], [place] [address]    Special Requests   Final    Blood Culture results may not be optimal due to an inadequate volume of blood received in culture bottles BOTTLES DRAWN AEROBIC AND ANAEROBIC Performed at [facility], [address], [place], [place] [address]    Culture   Final    NO GROWTH 3 DAYS Performed at [facility], [address] [place], [place] [address]    Report Status PENDING  Incomplete  Blood Culture (routine x 2)  Status: None (Preliminary result)   Collection Time: 10/27/23 12:54 PM   Specimen: BLOOD RIGHT WRIST  Result Value Ref Range Status   Specimen Description   Final    BLOOD RIGHT WRIST Performed at [facility], [address], [place], [place] [address]    Special Requests   Final    Blood Culture results may not be optimal due to an inadequate volume of blood received in culture bottles BOTTLES DRAWN AEROBIC AND ANAEROBIC Performed at [facility], [address], [place], [place] [address]    Culture   Final    NO GROWTH 3 DAYS Performed at [facility], [address], [place], [place] [address]    Report Status PENDING  Incomplete     Time coordinating discharge: Over 35 minutes  SIGNED:   Marinda Elk, MD  [facility] 10/30/2023, 10:01 AM Pager   If 7PM-7AM, please contact night-coverage [email or web address] Password [email or web address]

## 2023-10-30 NOTE — Progress Notes (Signed)
[name] O2 while ambulating was 97%.

## 2023-10-30 NOTE — Telephone Encounter (Signed)
I called and spoke with the Daniel Terrell  He had CTA done at ED 10/27/23  He is asking for results and next steps from Dr Francine Graven  Daniel Terrell is scheduled for appt with Dr Francine Graven on 11/23/23 but wants the results sooner  No sooner appt with JD to offer at this time  Please advise, thanks!

## 2023-10-30 NOTE — Progress Notes (Signed)
TOC discharge meds delivered to pt in room - placed in d/c envelope

## 2023-10-30 NOTE — Progress Notes (Signed)
Mobility Specialist - Progress Note   10/30/23 0918  Mobility  Activity Ambulated independently in hallway  Level of Assistance Independent  Assistive Device None  Distance Ambulated (ft) 450 ft  Range of Motion/Exercises Active  Activity Response Tolerated well  Mobility Referral Yes  Mobility visit 1 Mobility  Mobility Specialist Start Time (ACUTE ONLY) 0909  Mobility Specialist Stop Time (ACUTE ONLY) [id number]  Mobility Specialist Time Calculation (min) (ACUTE ONLY) 9 min   Pt was found in bed and agreeable to ambulate. No complaints with session. AT EOS returned to bed with all needs met. Call bell in reach.  Billey Chang Mobility Specialist

## 2023-10-31 ENCOUNTER — Other Ambulatory Visit (HOSPITAL_COMMUNITY): Payer: Self-pay

## 2023-10-31 ENCOUNTER — Other Ambulatory Visit: Payer: Self-pay

## 2023-10-31 DIAGNOSIS — G4733 Obstructive sleep apnea (adult) (pediatric): Secondary | ICD-10-CM | POA: Diagnosis not present

## 2023-10-31 MED ORDER — ALBUTEROL SULFATE HFA 108 (90 BASE) MCG/ACT IN AERS: 2.0000 | INHALATION_SPRAY | Freq: Four times a day (QID) | RESPIRATORY_TRACT | 6 refills | Status: AC | PRN

## 2023-10-31 MED ORDER — IPRATROPIUM-ALBUTEROL 0.5-2.5 (3) MG/3ML IN SOLN: 3.0000 mL | Freq: Four times a day (QID) | RESPIRATORY_TRACT | 11 refills | Status: AC | PRN

## 2023-10-31 NOTE — Telephone Encounter (Signed)
 Patient does not need PET CT scan, recent CT chest reviewed with patient and wife on phone. The RLL mass has improved significant with antibiotics since December. He is to complete a couple more days of augmentin  and doxycycline  then will need follow up CT chest in late March or April.  Prior CT chest order can be used for the follow up scan in March/April  JD

## 2023-11-01 LAB — CULTURE, BLOOD (ROUTINE X 2): Culture: NO GROWTH

## 2023-11-01 NOTE — Telephone Encounter (Signed)
 Spoke with patient on the phone. Will plan for follow up CT chest in 2-3 months.   JD

## 2023-11-01 NOTE — Telephone Encounter (Signed)
 Dr. Diania Fortes, please see patient response regarding CT scan.  He has a scheduled f/u on 2/27.

## 2023-11-03 DIAGNOSIS — R918 Other nonspecific abnormal finding of lung field: Secondary | ICD-10-CM | POA: Diagnosis not present

## 2023-11-03 DIAGNOSIS — R059 Cough, unspecified: Secondary | ICD-10-CM | POA: Diagnosis not present

## 2023-11-03 DIAGNOSIS — Z79899 Other long term (current) drug therapy: Secondary | ICD-10-CM | POA: Diagnosis not present

## 2023-11-03 DIAGNOSIS — R0781 Pleurodynia: Secondary | ICD-10-CM | POA: Diagnosis not present

## 2023-11-03 DIAGNOSIS — I7121 Aneurysm of the ascending aorta, without rupture: Secondary | ICD-10-CM | POA: Diagnosis not present

## 2023-11-03 DIAGNOSIS — J101 Influenza due to other identified influenza virus with other respiratory manifestations: Secondary | ICD-10-CM | POA: Diagnosis not present

## 2023-11-23 ENCOUNTER — Ambulatory Visit: Payer: PPO | Admitting: Pulmonary Disease

## 2023-12-11 ENCOUNTER — Other Ambulatory Visit (HOSPITAL_COMMUNITY): Payer: Self-pay

## 2023-12-11 ENCOUNTER — Encounter: Payer: Self-pay | Admitting: Pulmonary Disease

## 2023-12-11 ENCOUNTER — Other Ambulatory Visit: Payer: Self-pay

## 2023-12-11 MED ORDER — PAROXETINE HCL 30 MG PO TABS
60.0000 mg | ORAL_TABLET | Freq: Every day | ORAL | 1 refills | Status: DC
  Filled 2023-12-11: qty 180, 90d supply, fill #0
  Filled 2024-03-01: qty 180, 90d supply, fill #1

## 2023-12-18 NOTE — Telephone Encounter (Signed)
 The Referral has been faxed over to [facility]. I will call [facility] scheduling to get the PT scheduled as soon as they received the faxed referral.

## 2023-12-22 NOTE — Telephone Encounter (Signed)
 Pt has been scheduled for a 12/25/23 at [facility]. Pt is aware.

## 2023-12-25 DIAGNOSIS — R918 Other nonspecific abnormal finding of lung field: Secondary | ICD-10-CM | POA: Diagnosis not present

## 2023-12-25 DIAGNOSIS — J90 Pleural effusion, not elsewhere classified: Secondary | ICD-10-CM | POA: Diagnosis not present

## 2023-12-30 ENCOUNTER — Other Ambulatory Visit (HOSPITAL_COMMUNITY): Payer: Self-pay

## 2023-12-31 ENCOUNTER — Other Ambulatory Visit (HOSPITAL_COMMUNITY): Payer: Self-pay

## 2023-12-31 MED ORDER — AMLODIPINE BESYLATE 5 MG PO TABS
5.0000 mg | ORAL_TABLET | Freq: Every day | ORAL | 1 refills | Status: DC
  Filled 2023-12-31: qty 90, 90d supply, fill #0
  Filled 2024-03-27: qty 90, 90d supply, fill #1

## 2024-01-01 ENCOUNTER — Other Ambulatory Visit (HOSPITAL_COMMUNITY): Payer: Self-pay

## 2024-01-01 ENCOUNTER — Ambulatory Visit: Admitting: Pulmonary Disease

## 2024-01-01 ENCOUNTER — Encounter: Payer: Self-pay | Admitting: Pulmonary Disease

## 2024-01-01 VITALS — BP 142/89 | HR 75 | Ht 70.0 in | Wt 199.4 lb

## 2024-01-01 DIAGNOSIS — R918 Other nonspecific abnormal finding of lung field: Secondary | ICD-10-CM | POA: Diagnosis not present

## 2024-01-01 DIAGNOSIS — J449 Chronic obstructive pulmonary disease, unspecified: Secondary | ICD-10-CM | POA: Diagnosis not present

## 2024-01-01 DIAGNOSIS — R911 Solitary pulmonary nodule: Secondary | ICD-10-CM

## 2024-01-01 DIAGNOSIS — J90 Pleural effusion, not elsewhere classified: Secondary | ICD-10-CM | POA: Diagnosis not present

## 2024-01-01 NOTE — Progress Notes (Unsigned)
 Synopsis: Acute visit for hospital follow up  Subjective:   PATIENT ID: Daniel Terrell GENDER: male DOB: 08-26-1946, MRN: [id number]  HPI  No chief complaint on file.  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  Initial OV 09/21/23 He was admitted over the weekend to [facility] for progressive chest and back pain. He presented with severe chest pain that began on the 19th of the month. The pain, located in the upper right chest, has progressively worsened, leading to hyperventilation and panic attacks. Accompanying the pain, the patient reported episodes of fever and night sweats, which were more intense than his usual episodes.   The patient was admitted to [facility] due to the severity of the symptoms. During the hospital stay, a CT scan revealed a mass in the lower right lung. The patient was discharged with a prescription for hydrocodone and ibuprofen for pain management and Augmentin plus doxycycline as an antibiotic regimen.   The patient has a history of prostate cancer, treated with a prostatectomy 13 years ago, and has been followed for COPD and sleep apnea. The patient quit smoking in 1972. Prior to the onset of the current symptoms, the patient reported a month-long illness in September, initially suspected to be COVID-19, but tests for COVID and flu were negative. The patient also reported a general lack of energy throughout the year, with a significant decrease in activity levels. Denies dysphagia issues.  The patient uses a CPAP machine for sleep apnea, but has been unable to use it recently due to the chest pain. The patient reported that the CPAP machine's water canister is not sealing properly and has not been regularly cleaning the tubing.   OV 10/17/23 Patient reports slight improvement in his symptoms since last visit. He reports less back pain. He denies fevers,  chills or weight loss. He has been taking his augmentin twice daily.   OV 01/01/24 He underwent a recent CT scan which showed improvement in a previously concerning area in the right lower lung. Initially, there was fluid around the lung and haziness, but these have largely resolved. The area of concern has decreased in size and is now considered a nodule rather than a mass, with calcification suggesting scarring. He was previously treated with antibiotics, which led to improvement.  He experiences heavy breathing during physical activities and has had to scale back his normal activity level. He was hospitalized with acute respiratory failure, flu, and pneumonia, which he feels has left him still recovering. He experiences shortness of breath at times and has a history of chronic bronchitis. No cough but occasional wheezing. He is currently using Trelegy.  He has not been using his CPAP machine consistently due to needing resupplies, but he has been using oxygen. He has been taking short walks with his wife, who recently had open heart surgery.  He reports ongoing dizziness, which he speculates could be related to his blood pressure, noting it was high today.  He has a history of allergies to grass and trees, identified through an allergy test conducted several years ago.  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   Erectile dysfunction due to arterial insufficiency 11/09/2020   Essential hypertension 12/25/2020   Essential tremor    Family history of breast cancer    Family history of colon cancer    Fracture of shaft of  metacarpal bone 07/25/2022   GERD (gastroesophageal reflux disease)    Hypertriglyceridemia    IBS (irritable bowel syndrome)    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   Nocturnal hypoxemia 10/03/2022   uses O2 at night for COPD   Obstructive sleep apnea 01/06/2009   NPSG 01/12/03 AHI 42/ hr, loud snore, weight 200 lbs  Could not get comfortable with CPAP      OCD (obsessive compulsive disorder)    Pain in right hand 07/25/2022   PONV (postoperative nausea and vomiting)    Prostate cancer (HCC)    Pure hypercholesterolemia 04/30/2009   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   Restless legs syndrome (RLS) 11/28/2013   Seasonal and perennial allergic rhinitis 01/06/2009     Family History  Problem Relation Age of Onset   Colon polyps Mother    Tremor Mother    Breast cancer Mother 10   Memory loss Mother        early 67s   Heart attack Father    Alcohol abuse Father    Heart disease Father    Breast cancer Maternal Grandmother    Dementia Maternal Grandmother    Colon cancer Maternal Grandfather    Esophageal cancer Neg Hx    Stomach cancer Neg Hx    Rectal cancer Neg Hx      Social History   Socioeconomic History   Marital status: Married    Spouse name: Alvino Chapel   Number of children: 3   Years of education: 16   Highest education level: Bachelor's degree (e.g., BA, AB, BS)  Occupational History   Occupation: Retired    Comment: Education officer, environmental  Tobacco Use   Smoking status: Former    Current packs/day: 0.00    Types: Cigarettes    Quit date: 09/26/1970    Years since quitting: 53.3   Smokeless tobacco: Never  Vaping Use   Vaping status: Never Used  Substance and Sexual Activity   Alcohol use: No   Drug use: No   Sexual activity: Not on file  Other Topics Concern   Not on file  Social History Narrative   Patient is married with 3 children.   Patient is right handed.   Patient has a Bachelor's degree.    Patient drinks 2-3 cups daily.      Social Drivers of Corporate investment banker Strain: Not on file  Food Insecurity: Not on file  Transportation Needs: Not on file  Physical Activity: Not on file  Stress: Not on file  Social Connections: Not on file  Intimate Partner Violence: Not on file     Allergies  Allergen Reactions   Levofloxacin     REACTION: disorientation  Other Reaction(s): confusion   Moxifloxacin     Other Reaction(s): confusion   Ofloxacin     REACTION: disorientation  Other Reaction(s): confusion  Other Reaction(s): confusion, confusion, confusion   Rosuvastatin     Other Reaction(s): myalgias (muscle pain)   Topiramate Other (See Comments)    drowsy  Outpatient Medications Prior to Visit  Medication Sig Dispense Refill   albuterol (VENTOLIN HFA) 108 (90 Base) MCG/ACT inhaler Inhale 2 puffs into the lungs every 6 (six) hours as needed for wheezing or shortness of breath. 8 g 6   amLODipine (NORVASC) 5 MG tablet Take 1 tablet (5 mg total) by mouth daily for blood pressure. 90 tablet 1   aspirin 81 MG tablet Take 81 mg by mouth daily.     cetirizine (ZYRTEC) 10 MG tablet Take 10 mg by mouth daily.     Choline Fenofibrate (TRILIPIX) 135 MG capsule Take 1 capsule (135 mg total) by mouth daily for cholesterol 90 capsule 3   fluticasone (FLONASE) 50 MCG/ACT nasal spray Place 2 sprays into both nostrils daily. (Patient taking differently: Place 2 sprays into both nostrils in the morning.) 48 g 3   Fluticasone-Umeclidin-Vilant (TRELEGY ELLIPTA) 100-62.5-25 MCG/ACT AEPB Inhale 1 puff into the lungs daily. (Patient taking differently: Inhale 1 puff into the lungs in the morning.) 180 each 1   glucosamine-chondroitin 500-400 MG tablet Take 1 tablet by mouth 2 (two) times daily.     ipratropium-albuterol (DUONEB) 0.5-2.5 (3) MG/3ML SOLN Take 3 mLs by nebulization every 6 (six) hours as needed. 360 mL 11   montelukast (SINGULAIR) 10 MG tablet 1 tab by mouth daily  (Patient taking differently: Take 10 mg by mouth at bedtime.) 90 tablet 3   Multiple Vitamins-Minerals (MULTIVITAL PO) Take 1 tablet by mouth daily.     omeprazole (PRILOSEC) 20 MG capsule Take 1 capsule (20 mg total) by mouth daily. (Patient taking differently: Take 20 mg by mouth at bedtime.) 90 capsule 4   OXYGEN Take 2 L by mouth at bedtime. Uses with CPAP face mask and then as needed during the daytime.     PARoxetine (PAXIL) 30 MG tablet Take 2 tablets (60 mg total) by mouth daily. 180 tablet 1   propranolol ER (INDERAL LA) 80 MG 24 hr capsule Take 1 capsule (80 mg total) by mouth daily. (Patient taking differently: Take 80 mg by mouth daily. For his Tremors) 90 capsule 4   VITAMIN D PO Take 5,000 Units by mouth daily.     amoxicillin-clavulanate (AUGMENTIN) 875-125 MG tablet Take 1 tablet by mouth 2 (two) times daily. (Patient not taking: Reported on 01/01/2024) 60 tablet 0   doxycycline (VIBRAMYCIN) 100 MG capsule Take 100 mg by mouth 2 (two) times daily. (Patient not taking: Reported on 01/01/2024)     No facility-administered medications prior to visit.   Review of Systems  Constitutional:  Negative for chills, fever, malaise/fatigue and weight loss.  HENT:  Negative for congestion, sinus pain and sore throat.   Eyes: Negative.   Respiratory:  Positive for shortness of breath. Negative for cough, hemoptysis, sputum production and wheezing.   Cardiovascular:  Negative for chest pain, palpitations, orthopnea, claudication and leg swelling.  Gastrointestinal:  Negative for abdominal pain, heartburn, nausea and vomiting.  Genitourinary: Negative.   Musculoskeletal:  Negative for back pain, joint pain and myalgias.  Skin:  Negative for rash.  Neurological:  Negative for weakness.  Endo/Heme/Allergies: Negative.   Psychiatric/Behavioral: Negative.     Objective:   Vitals:   01/01/24 0949  BP: (!) 142/89  Pulse: 75  SpO2: 93%  Weight: 199 lb 6.4 oz (90.4 kg)  Height: 5\' 10"  (1.778 m)    Physical Exam Constitutional:      General: He is not in acute distress.    Appearance: Normal appearance.  Eyes:  General: No scleral icterus.    Conjunctiva/sclera: Conjunctivae normal.  Cardiovascular:     Rate and Rhythm: Normal rate and regular rhythm.  Pulmonary:     Breath sounds: No wheezing, rhonchi or rales.  Musculoskeletal:     Right lower leg: No edema.     Left lower leg: No edema.  Skin:    General: Skin is warm and dry.  Neurological:     General: No focal deficit present.    CBC    Component Value Date/Time   WBC 4.3 10/28/2023 0538   RBC 3.87 (L) 10/28/2023 0538   HGB 11.9 (L) 10/28/2023 0538   HCT 34.9 (L) 10/28/2023 0538   PLT 225 10/28/2023 0538   MCV 90.2 10/28/2023 0538   MCH 30.7 10/28/2023 0538   MCHC 34.1 10/28/2023 0538   RDW 14.6 10/28/2023 0538   LYMPHSABS 1.1 10/27/2023 1249   MONOABS 0.8 10/27/2023 1249   EOSABS 0.1 10/27/2023 1249   BASOSABS 0.0 10/27/2023 1249      Latest Ref Rng & Units 10/28/2023    5:38 AM 10/27/2023   12:49 PM 12/31/2020   10:06 AM  BMP  Glucose 70 - 99 mg/dL 83  99  84   BUN 8 - 23 mg/dL 21  29  28    Creatinine 0.61 - 1.24 mg/dL 2.44  0.10  2.72   BUN/Creat Ratio 10 - 24   26   Sodium 135 - 145 mmol/L 136  138  138   Potassium 3.5 - 5.1 mmol/L 3.7  4.1  4.7   Chloride 98 - 111 mmol/L 106  103  103   CO2 22 - 32 mmol/L 22  25  22    Calcium 8.9 - 10.3 mg/dL 8.4  9.8  9.2    Chest imaging: CT Chest 12/25/23 Significant improvement regarding the right hemi thorax since the previous study. Previously noted loculated and free flowing right pleural effusions have nearly resolved. There is a persistent small free-flowing pleural effusion with a persistent though improved noncalcified nodule in the right lung base. On the previous examination it measured proximally 4.2 x 2.7 cm, on current examination it measures 0.9 cm. Findings suggest benignity and continued follow-up is recommended to ensure resolution of the  effusion. There is no organized infiltrate in either lung field. No new suspicious noncalcified mass or nodule.  Soft tissue windows show normal-appearing thyroid gland. No suspicious axillary, mediastinal or perihilar adenopathy.   CXR 06/02/22 The lungs are clear. Heart size is normal. No pneumothorax or pleural fluid. No acute or focal bony abnormality. Thoracic and lumbar degenerative change noted.  Cardiac Coronary CT Scan 01/05/21 Mediastinum/Nodes: No imaged thoracic adenopathy.   Lungs/Pleura: No pleural fluid. Lingular scarring or subsegmental atelectasis.   PFT:    Latest Ref Rng & Units 07/11/2022   10:42 AM  PFT Results  FVC-Pre L 4.03   FVC-Predicted Pre % 94   FVC-Post L 4.25   FVC-Predicted Post % 99   Pre FEV1/FVC % % 78   Post FEV1/FCV % % 79   FEV1-Pre L 3.16   FEV1-Predicted Pre % 101   FEV1-Post L 3.36   DLCO uncorrected ml/min/mmHg 23.52   DLCO UNC% % 93   DLCO corrected ml/min/mmHg 23.52   DLCO COR %Predicted % 93   DLVA Predicted % 94   TLC L 6.77   TLC % Predicted % 96   RV % Predicted % 97     Labs:  Path:  Echo:  Heart  Catheterization:    Assessment & Plan:   COPD mixed type (HCC)  Pleural effusion on right  Right lower lobe lung mass  Lung nodule - Plan: CT Chest Wo Contrast  Discussion: Daniel Terrell is a 78 year old male, former smoker with COPD and OSA on CPAP who returns to pulmonary clinic for right lower lobe mass vs abscess.  Right Lower Lobe Nodule Significant improvement of RLL opacification likely an abscess or dense pneumonia. Small nodule remains. - plan to repeat CT Chest scan in 1 year  COPD - continue trelegy ellipta 1 puff daily and as needed albuterol  Pleural Effusion  - improved on recent CT Chest  Follow up in 1 month, call sooner if needed  Melody Comas, MD [facility] Office: [phone]   Current Outpatient Medications:    albuterol (VENTOLIN HFA) 108 (90 Base)  MCG/ACT inhaler, Inhale 2 puffs into the lungs every 6 (six) hours as needed for wheezing or shortness of breath., Disp: 8 g, Rfl: 6   amLODipine (NORVASC) 5 MG tablet, Take 1 tablet (5 mg total) by mouth daily for blood pressure., Disp: 90 tablet, Rfl: 1   aspirin 81 MG tablet, Take 81 mg by mouth daily., Disp: , Rfl:    cetirizine (ZYRTEC) 10 MG tablet, Take 10 mg by mouth daily., Disp: , Rfl:    Choline Fenofibrate (TRILIPIX) 135 MG capsule, Take 1 capsule (135 mg total) by mouth daily for cholesterol, Disp: 90 capsule, Rfl: 3   fluticasone (FLONASE) 50 MCG/ACT nasal spray, Place 2 sprays into both nostrils daily. (Patient taking differently: Place 2 sprays into both nostrils in the morning.), Disp: 48 g, Rfl: 3   Fluticasone-Umeclidin-Vilant (TRELEGY ELLIPTA) 100-62.5-25 MCG/ACT AEPB, Inhale 1 puff into the lungs daily. (Patient taking differently: Inhale 1 puff into the lungs in the morning.), Disp: 180 each, Rfl: 1   glucosamine-chondroitin 500-400 MG tablet, Take 1 tablet by mouth 2 (two) times daily., Disp: , Rfl:    ipratropium-albuterol (DUONEB) 0.5-2.5 (3) MG/3ML SOLN, Take 3 mLs by nebulization every 6 (six) hours as needed., Disp: 360 mL, Rfl: 11   montelukast (SINGULAIR) 10 MG tablet, 1 tab by mouth daily (Patient taking differently: Take 10 mg by mouth at bedtime.), Disp: 90 tablet, Rfl: 3   Multiple Vitamins-Minerals (MULTIVITAL PO), Take 1 tablet by mouth daily., Disp: , Rfl:    omeprazole (PRILOSEC) 20 MG capsule, Take 1 capsule (20 mg total) by mouth daily. (Patient taking differently: Take 20 mg by mouth at bedtime.), Disp: 90 capsule, Rfl: 4   OXYGEN, Take 2 L by mouth at bedtime. Uses with CPAP face mask and then as needed during the daytime., Disp: , Rfl:    PARoxetine (PAXIL) 30 MG tablet, Take 2 tablets (60 mg total) by mouth daily., Disp: 180 tablet, Rfl: 1   propranolol ER (INDERAL LA) 80 MG 24 hr capsule, Take 1 capsule (80 mg total) by mouth daily. (Patient taking  differently: Take 80 mg by mouth daily. For his Tremors), Disp: 90 capsule, Rfl: 4   VITAMIN D PO, Take 5,000 Units by mouth daily., Disp: , Rfl:    amoxicillin-clavulanate (AUGMENTIN) 875-125 MG tablet, Take 1 tablet by mouth 2 (two) times daily. (Patient not taking: Reported on 01/01/2024), Disp: 60 tablet, Rfl: 0   doxycycline (VIBRAMYCIN) 100 MG capsule, Take 100 mg by mouth 2 (two) times daily. (Patient not taking: Reported on 01/01/2024), Disp: , Rfl:

## 2024-01-01 NOTE — Patient Instructions (Addendum)
 Your CT scan is much improved from before.  Continue trelegy ellipta 1 puff daily - rinse mouth out after each use  Continue albuterol inhaler 1-2 puffs every 4-6 hours as needed  Work on your conditioning by doing scheduled physical activity by joining a gym or going for walks  We will repeat a CT Chest scan in 1 year  Follow up in 1 year after CT Chest

## 2024-01-03 ENCOUNTER — Encounter: Payer: Self-pay | Admitting: Pulmonary Disease

## 2024-01-09 ENCOUNTER — Ambulatory Visit: Payer: PPO | Admitting: Cardiology

## 2024-01-09 DIAGNOSIS — K219 Gastro-esophageal reflux disease without esophagitis: Secondary | ICD-10-CM | POA: Insufficient documentation

## 2024-01-15 ENCOUNTER — Telehealth: Payer: Self-pay

## 2024-01-15 ENCOUNTER — Other Ambulatory Visit (HOSPITAL_COMMUNITY): Payer: Self-pay

## 2024-01-15 ENCOUNTER — Ambulatory Visit

## 2024-01-15 VITALS — BP 124/82 | HR 72 | Ht 70.0 in | Wt 199.6 lb

## 2024-01-15 DIAGNOSIS — E782 Mixed hyperlipidemia: Secondary | ICD-10-CM | POA: Diagnosis not present

## 2024-01-15 DIAGNOSIS — I351 Nonrheumatic aortic (valve) insufficiency: Secondary | ICD-10-CM

## 2024-01-15 DIAGNOSIS — I10 Essential (primary) hypertension: Secondary | ICD-10-CM | POA: Diagnosis not present

## 2024-01-15 DIAGNOSIS — I2510 Atherosclerotic heart disease of native coronary artery without angina pectoris: Secondary | ICD-10-CM | POA: Diagnosis not present

## 2024-01-15 DIAGNOSIS — I77810 Thoracic aortic ectasia: Secondary | ICD-10-CM

## 2024-01-15 MED ORDER — REPATHA 140 MG/ML ~~LOC~~ SOSY
140.0000 mg | PREFILLED_SYRINGE | SUBCUTANEOUS | 2 refills | Status: DC
Start: 2024-01-15 — End: 2024-06-26
  Filled 2024-01-15 – 2024-06-08 (×4): qty 2, 28d supply, fill #0

## 2024-01-15 MED ORDER — REPATHA 140 MG/ML ~~LOC~~ SOSY
140.0000 mg | PREFILLED_SYRINGE | SUBCUTANEOUS | 2 refills | Status: DC
Start: 2024-01-15 — End: 2024-01-15

## 2024-01-15 NOTE — Addendum Note (Signed)
 Addended by: Wilborn Membreno P on: 01/15/2024 03:04 PM   Modules accepted: Orders

## 2024-01-15 NOTE — Assessment & Plan Note (Signed)
 Prior cardiac CT imaging April 2022 CAD RADS 1. Recent stress test with nuclear imaging December 2024 at [facility] no ischemia.  Continue aspirin  81 mg once daily. Intolerant to statins.  Further lipid management as reviewed below.

## 2024-01-15 NOTE — Assessment & Plan Note (Signed)
 Size 4.4 cm on CT chest January 2025.  In comparison was 4.2 cm on cardiac CT from April 2022 Continue follow-up with imaging tentatively in 1 year.

## 2024-01-15 NOTE — Assessment & Plan Note (Signed)
 Well-controlled. Continue amlodipine  5 mg once daily. Also remains on propranolol  low-dose essentially for his chronic tremor.

## 2024-01-15 NOTE — Assessment & Plan Note (Signed)
 No known prior echo December 2024 at [facility]. Will follow-up with repeat echocardiogram tentatively in 2 to 3 years.

## 2024-01-15 NOTE — Addendum Note (Signed)
 Addended by: Shatisha Falter P on: 01/15/2024 03:05 PM   Modules accepted: Orders

## 2024-01-15 NOTE — Progress Notes (Signed)
 Cardiology Consultation:    Date:  01/15/2024   ID:  Daniel Terrell, DOB 19-Oct-1945, MRN [id number]  PCP:  Olga Berthold, NP  Cardiologist:  Angelena Kells, MD   Referring MD: Olga Berthold, NP   No chief complaint on file.    ASSESSMENT AND PLAN:   [name] 78 year old with history of minimal coronary atherosclerosis on cardiac CT from April 2022 CAD RADS 1 study, mild aortic insufficiency, mildly dilated ascending aorta measuring 4.4 cm on CT chest January 2025 [was 4.1 cm in April 2022], abdominal aorta measuring 2.9 cm on ultrasound April 2022, COPD, hypertension, hyperlipidemia, statin intolerance.  Problem List Items Addressed This Visit     Hyperlipidemia   Last lipid panel from November 2020 for total cholesterol 216, HDL 50, LDL 135, triglycerides 119. Suboptimal.  Did not tolerate statins.  Does not remember accurately but mentions did not tolerate Zetia.  Recommended PCSK9 inhibitors, he request further reviewing with his wife and he will inform our office. If he is agreeable we will start Repatha  140 mg subcutaneous injection once every 2 weeks.       Essential hypertension   Well-controlled. Continue amlodipine  5 mg once daily. Also remains on propranolol  low-dose essentially for his chronic tremor.       Relevant Orders   EKG 12-Lead (Completed)   Mild ascending aorta dilatation (HCC)   Size 4.4 cm on CT chest January 2025.  In comparison was 4.2 cm on cardiac CT from April 2022 Continue follow-up with imaging tentatively in 1 year.       Mild aortic insufficiency   No known prior echo December 2024 at [facility] Will follow-up with repeat echocardiogram tentatively in 2 to 3 years.      Coronary atherosclerosis - Primary   Prior cardiac CT imaging April 2022 CAD RADS 1. Recent stress test with nuclear imaging December 2024 at [facility] no ischemia.  Continue aspirin  81 mg once daily. Intolerant to statins.   Further lipid management as reviewed below.       Return to clinic tentatively in 1 year or as needed.   History of Present Illness:    Daniel Terrell is a 78 y.o. male who is being seen today for follow-up visit. PCP is Moon, Amy A, NP. Last visit with our office was 04-29-2022 with Dr. Sandee Crook.  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  Here for the by himself.  Lives at home with his wife.  Denies any active cardiac symptoms at this time.  Mentions in February he has had a visit in [facility] for pneumonia with right lower lobe infiltrates.  Denies any symptoms of chest pain, shortness of breath, orthopnea, paroxysmal nocturnal dyspnea.  Has mild dry cough attributed to his COPD.  Denies he could not tolerate statins in the past has tried Crestor , Lipitor, had tried lower frequency.  Mentions he does not have MRI currently but has tried Zetia which was poorly tolerated. He has never been on PCSK9 inhibitors and is willing to consider this but would like to further discuss this with his wife.  In the clinic today shows sinus rhythm heart rate 72/min, PR interval 160 ms, QRS duration 86 ms, QTc 448 ms.  Inferior Q waves noted.  No significant change in comparison to prior EKG from 10-17-2023.  Echocardiogram 09-18-2023 at [facility] reported normal biventricular function LVEF 55 to 60%, mild concentric left ventricular hypertrophy, grade 1 diastolic dysfunction, trace MR and TR, mild aortic insufficiency.  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  The report date is 06/16/2023.     The CancerNext-Expanded gene panel offered by 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   Nocturnal hypoxemia 10/03/2022   uses O2 at night for COPD   Obstructive sleep apnea 01/06/2009   NPSG 01/12/03 AHI 42/ hr, loud snore, weight 200 lbs  Could not get comfortable with CPAP      OCD (obsessive compulsive disorder)    Pain in right hand 07/25/2022   PONV (postoperative nausea and vomiting)    Prostate cancer (HCC)    Pure hypercholesterolemia 04/30/2009   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   amLODipine  (NORVASC ) 5 MG tablet Take 1 tablet (5 mg total) by mouth daily for blood pressure.   aspirin  81 MG tablet Take 81 mg by mouth daily.   cetirizine (ZYRTEC) 10 MG tablet Take 10 mg by mouth daily.   Choline Fenofibrate  (TRILIPIX ) 135 MG capsule Take 1 capsule (135 mg total) by mouth daily for cholesterol   fluticasone  (FLONASE ) 50 MCG/ACT  nasal spray Place 2 sprays into both nostrils daily.   Fluticasone -Umeclidin-Vilant (TRELEGY ELLIPTA ) 100-62.5-25 MCG/ACT AEPB Inhale 1 puff into the lungs daily.   glucosamine-chondroitin 500-400 MG tablet Take 1 tablet by mouth 2 (two) times daily.   ipratropium-albuterol  (DUONEB) 0.5-2.5 (3) MG/3ML SOLN Take 3 mLs by nebulization every 6 (six) hours as needed.   montelukast  (SINGULAIR ) 10 MG tablet 1 tab by mouth daily   Multiple Vitamins-Minerals (MULTIVITAL PO) Take 1 tablet by mouth daily.   omeprazole  (PRILOSEC) 20 MG capsule Take 1 capsule (20 mg total) by mouth daily.   OXYGEN  Take 2 L by mouth at bedtime. Uses with CPAP face mask and then as needed during the daytime.   PARoxetine  (PAXIL ) 30 MG tablet Take 2 tablets (60 mg total) by mouth daily.   propranolol  ER (INDERAL  LA) 80 MG 24 hr capsule Take 1 capsule (80 mg total) by mouth daily.   VITAMIN D PO Take 5,000 Units by mouth daily.     Allergies:   Levofloxacin, Moxifloxacin, Ofloxacin, Rosuvastatin , and Topiramate    Social History   Socioeconomic History   Marital status: Married    Spouse name: Nancyann Aye    Number of children: 3   Years of education: 16   Highest education level: Bachelor's degree (e.g., BA, AB, BS)  Occupational History   Occupation: Retired    Comment: Education officer, environmental  Tobacco Use   Smoking status: Former    Current packs/day: 0.00    Types: Cigarettes    Quit date: 09/26/1970    Years since quitting: 53.3   Smokeless tobacco: Never  Vaping Use   Vaping status: Never Used  Substance and Sexual Activity   Alcohol use: No   Drug use: No   Sexual activity: Not on file  Other Topics Concern   Not on file  Social History Narrative   Patient is married with 3 children.   Patient is right handed.   Patient has a Bachelor's degree.   Patient drinks 2-3 cups daily.      Social Drivers of Corporate investment banker Strain: Not on file  Food Insecurity: Not on file  Transportation Needs: Not on file  Physical Activity: Not on file  Stress: Not on file  Social Connections: Not on file     Family History: The patient's family history includes Alcohol abuse in his father; Breast cancer in his maternal grandmother; Breast cancer (age of onset: 24) in his mother; Colon cancer in his maternal grandfather; Colon polyps in his mother; Dementia in his maternal grandmother; Heart attack in his father; Heart disease in his father; Memory loss in his mother; Tremor in his mother. There is no history of Esophageal cancer, Stomach cancer, or Rectal cancer. ROS:   Please see the history of present illness.    All 14 point review of systems negative except as described per history of present illness.  EKGs/Labs/Other Studies Reviewed:    The following studies were reviewed today:   EKG:  EKG Interpretation Date/Time:  Monday January 15 2024 09:06:46 EDT Ventricular Rate:  72 PR Interval:  160 QRS Duration:  86 QT Interval:  410 QTC Calculation: 448 R Axis:   -1  Text Interpretation: Normal sinus rhythm Inferior infarct , age undetermined Abnormal ECG When compared with ECG of  27-Oct-2023 12:48, PREVIOUS ECG IS PRESENT Confirmed by Bertha Broad reddy [phone]) on 01/15/2024 9:16:56 AM    Recent Labs: 10/27/2023: ALT 20 10/28/2023: BUN 21; Creatinine, Ser 0.89; Hemoglobin 11.9; Platelets 225;  Potassium 3.7; Sodium 136  Recent Lipid Panel No results found for: "CHOL", "TRIG", "HDL", "CHOLHDL", "VLDL", "LDLCALC", "LDLDIRECT"  Physical Exam:    VS:  BP 124/82   Pulse 72   Ht 5\' 10"  (1.778 m)   Wt 199 lb 9.6 oz (90.5 kg)   SpO2 95%   BMI 28.64 kg/m     Wt Readings from Last 3 Encounters:  01/15/24 199 lb 9.6 oz (90.5 kg)  01/01/24 199 lb 6.4 oz (90.4 kg)  10/27/23 201 lb 8 oz (91.4 kg)     GENERAL:  Well nourished, well developed in no acute distress NECK: No JVD; No carotid bruits CARDIAC: RRR, S1 and S2 present, no murmurs, no rubs, no gallops CHEST:  Clear to auscultation without rales, wheezing or rhonchi  Extremities: No pitting pedal edema. Pulses bilaterally symmetric with radial 2+ and dorsalis pedis 2+ NEUROLOGIC:  Alert and oriented x 3  Medication Adjustments/Labs and Tests Ordered: Current medicines are reviewed at length with the patient today.  Concerns regarding medicines are outlined above.  Orders Placed This Encounter  Procedures   EKG 12-Lead   No orders of the defined types were placed in this encounter.   Signed, Lura Sallies, MD, MPH, [facility]. 01/15/2024 10:03 AM    [facility]

## 2024-01-15 NOTE — Assessment & Plan Note (Signed)
 Last lipid panel from November 2020 for total cholesterol 216, HDL 50, LDL 135, triglycerides 119. Suboptimal.  Did not tolerate statins.  Does not remember accurately but mentions did not tolerate Zetia.  Recommended PCSK9 inhibitors, he request further reviewing with his wife and he will inform our office. If he is agreeable we will start Repatha  140 mg subcutaneous injection once every 2 weeks.

## 2024-01-15 NOTE — Telephone Encounter (Signed)
 Pt c/o medication issue:  1. Name of Medication: Repatha    2. How are you currently taking this medication (dosage and times per day)? Not currently taking  3. Are you having a reaction (difficulty breathing--STAT)? No   4. What is your medication issue? Wife is calling stating they discussed this medication and would like to go through with starting it. She request it be sent to [facility]. Please advise.

## 2024-01-15 NOTE — Patient Instructions (Signed)
 Medication Instructions:  Your physician recommends that you continue on your current medications as directed. Please refer to the Current Medication list given to you today.  *If you need a refill on your cardiac medications before your next appointment, please call your pharmacy*   Lab Work: None Ordered If you have labs (blood work) drawn today and your tests are completely normal, you will receive your results only by: MyChart Message (if you have MyChart) OR A paper copy in the mail If you have any lab test that is abnormal or we need to change your treatment, we will call you to review the results.   Testing/Procedures: None Ordered   Follow-Up: At [facility], you and your health needs are our priority.  As part of our continuing mission to provide you with exceptional heart care, we have created designated Provider Care Teams.  These Care Teams include your primary Cardiologist (physician) and Advanced Practice Providers (APPs -  Physician Assistants and Nurse Practitioners) who all work together to provide you with the care you need, when you need it.  We recommend signing up for the patient portal called "MyChart".  Sign up information is provided on this After Visit Summary.  MyChart is used to connect with patients for Virtual Visits (Telemedicine).  Patients are able to view lab/test results, encounter notes, upcoming appointments, etc.  Non-urgent messages can be sent to your provider as well.   To learn more about what you can do with MyChart, go to [email or web address].    Your next appointment:   1 year

## 2024-01-16 ENCOUNTER — Other Ambulatory Visit (HOSPITAL_COMMUNITY): Payer: Self-pay

## 2024-01-16 ENCOUNTER — Telehealth: Payer: Self-pay

## 2024-01-16 ENCOUNTER — Telehealth: Payer: Self-pay | Admitting: Pharmacy Technician

## 2024-01-16 NOTE — Telephone Encounter (Signed)
 [facility] is requesting a PA for Repatha  through covermymeds  Key: [id number]

## 2024-01-16 NOTE — Telephone Encounter (Signed)
 Pharmacy Patient Advocate Encounter   Received notification from Pt Calls Messages that prior authorization for Repatha  is required/requested.   Insurance verification completed.   The patient is insured through [facility] ADVANTAGE/RX ADVANCE .   Per test claim: PA required; PA submitted to above mentioned insurance via CoverMyMeds Key/confirmation #/EOC [id number] Status is pending

## 2024-01-16 NOTE — Telephone Encounter (Signed)
 Pharmacy Patient Advocate Encounter  Received notification from [employer] that Prior Authorization for repatha  has been DENIED.  Patient needs updated labs and we can resubmit   PA #/Case ID/Reference #: [id number]

## 2024-01-18 ENCOUNTER — Other Ambulatory Visit: Payer: Self-pay

## 2024-01-18 ENCOUNTER — Other Ambulatory Visit (HOSPITAL_COMMUNITY): Payer: Self-pay

## 2024-01-22 ENCOUNTER — Other Ambulatory Visit (HOSPITAL_COMMUNITY): Payer: Self-pay

## 2024-01-25 ENCOUNTER — Other Ambulatory Visit (HOSPITAL_COMMUNITY): Payer: Self-pay

## 2024-01-26 ENCOUNTER — Other Ambulatory Visit (HOSPITAL_COMMUNITY): Payer: Self-pay

## 2024-01-26 ENCOUNTER — Other Ambulatory Visit: Payer: Self-pay | Admitting: Internal Medicine

## 2024-01-26 ENCOUNTER — Other Ambulatory Visit: Payer: Self-pay

## 2024-01-26 MED ORDER — TRELEGY ELLIPTA 100-62.5-25 MCG/ACT IN AEPB
1.0000 | INHALATION_SPRAY | Freq: Every day | RESPIRATORY_TRACT | 1 refills | Status: DC
  Filled 2024-01-26: qty 180, 90d supply, fill #0
  Filled 2024-04-26: qty 180, 90d supply, fill #1

## 2024-01-29 ENCOUNTER — Other Ambulatory Visit (HOSPITAL_COMMUNITY): Payer: Self-pay

## 2024-01-30 ENCOUNTER — Other Ambulatory Visit (HOSPITAL_COMMUNITY): Payer: Self-pay

## 2024-01-31 ENCOUNTER — Other Ambulatory Visit (HOSPITAL_COMMUNITY): Payer: Self-pay

## 2024-02-07 ENCOUNTER — Ambulatory Visit: Admitting: Pulmonary Disease

## 2024-02-16 DIAGNOSIS — M545 Low back pain, unspecified: Secondary | ICD-10-CM | POA: Diagnosis not present

## 2024-02-28 ENCOUNTER — Telehealth: Payer: Self-pay | Admitting: Gastroenterology

## 2024-02-28 ENCOUNTER — Encounter: Payer: Self-pay | Admitting: Gastroenterology

## 2024-02-28 NOTE — Telephone Encounter (Signed)
 Error

## 2024-03-01 ENCOUNTER — Other Ambulatory Visit (HOSPITAL_COMMUNITY): Payer: Self-pay

## 2024-03-04 ENCOUNTER — Other Ambulatory Visit (HOSPITAL_COMMUNITY): Payer: Self-pay

## 2024-03-13 ENCOUNTER — Ambulatory Visit (HOSPITAL_BASED_OUTPATIENT_CLINIC_OR_DEPARTMENT_OTHER)
Admit: 2024-03-13 | Discharge: 2024-03-13 | Disposition: A | Discharge status: Home / Self Care | Attending: Family Medicine | Admitting: Radiology

## 2024-03-13 ENCOUNTER — Ambulatory Visit (HOSPITAL_BASED_OUTPATIENT_CLINIC_OR_DEPARTMENT_OTHER)
Admission: EM | Admit: 2024-03-13 | Discharge: 2024-03-13 | Disposition: A | Discharge status: Home / Self Care | Attending: Family Medicine | Admitting: Family Medicine

## 2024-03-13 ENCOUNTER — Ambulatory Visit (HOSPITAL_BASED_OUTPATIENT_CLINIC_OR_DEPARTMENT_OTHER): Payer: Self-pay | Admitting: Family Medicine

## 2024-03-13 ENCOUNTER — Encounter (HOSPITAL_BASED_OUTPATIENT_CLINIC_OR_DEPARTMENT_OTHER): Payer: Self-pay | Admitting: Emergency Medicine

## 2024-03-13 DIAGNOSIS — S5011XA Contusion of right forearm, initial encounter: Secondary | ICD-10-CM

## 2024-03-13 DIAGNOSIS — R0781 Pleurodynia: Secondary | ICD-10-CM | POA: Diagnosis not present

## 2024-03-13 NOTE — Discharge Instructions (Signed)
 Chest & Rib X-Rays appear negative/normal.  Patient will be called, if the Radiologist gives a differing result.  May use Ibuprofen  800mg  every 8-12 hours, if needed for rib pain.  Encouraged to use Incentive Spirometer, inhale 1,000-2,000 ml for ten times, every one to two hours while awake to prevent pneumonia.  Follow-up with AWells Halim, FNP or here, if symptoms do not improve, worsen or new symptoms occur.

## 2024-03-13 NOTE — ED Triage Notes (Signed)
 Pt c/o right rib pain he was working outside in the yard and he fell on last Thursday he states it hurts to take a deep breathe.

## 2024-03-13 NOTE — Progress Notes (Signed)
 Chest X-Ray was negative.  Patient updated.

## 2024-03-13 NOTE — ED Provider Notes (Signed)
 [facility]    CSN: [id number] Arrival date & time: 03/13/24  4098      History   Chief Complaint No chief complaint on file.   Daniel Terrell is a 78 y.o. male.   Patient fell in his yard on 03/07/2024 Thursday of last week.  He stepped in a hole and fell forward on his right arm he was holding a lawnmower tire at the time and it got compressed between his right arm and his right ribs.  He has significant bruising on his right arm near the elbow.  But he has full range of motion and really was not aware that there was a tear.  He has significant pain in his right ribs between the 3rd and 6th ribs.  He has pain at an 8 or 9 at.  He has pain with deep breathing.  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  The report date is 06/16/2023.     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   Nocturnal hypoxemia 10/03/2022   uses O2 at night for COPD   Obstructive sleep apnea 01/06/2009   NPSG 01/12/03 AHI 42/ hr, loud snore, weight 200 lbs  Could not get comfortable with CPAP      OCD (obsessive compulsive disorder)    Pain in right hand 07/25/2022   PONV (postoperative nausea and vomiting)    Prostate cancer (HCC)    Pure hypercholesterolemia 04/30/2009   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   Restless legs syndrome (RLS) 11/28/2013   Seasonal and perennial allergic rhinitis 01/06/2009    Patient Active Problem List   Diagnosis Date Noted   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   Mild aortic insufficiency 01/15/2024   Coronary atherosclerosis 01/15/2024   GERD (gastroesophageal reflux disease)    Influenza A 10/27/2023   Genetic testing 06/19/2023   Family history of breast cancer    Family history of colon cancer    Lacunar infarction (HCC)    Nocturnal hypoxemia 10/03/2022   Pain in right hand 07/25/2022   Acquired hallux rigidus of right foot 06/01/2021   Essential hypertension 12/25/2020  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   Prostate cancer (HCC)    PONV (postoperative nausea and vomiting)    OCD (obsessive compulsive disorder)    IBS (irritable bowel syndrome)    Hyperlipidemia    COPD mixed type (HCC)    Colon polyps    Arthritis    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   Erectile dysfunction due to arterial insufficiency 11/09/2020   Dyspnea on exertion 10/02/2019   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   Essential tremor 11/28/2013   Restless legs syndrome (RLS) 11/28/2013   Acute sinusitis 05/06/2012   Pure hypercholesterolemia 04/30/2009   Asthma 01/09/2009   Obstructive sleep apnea 01/06/2009   Seasonal and perennial allergic rhinitis 01/06/2009    Chronic bronchitis (HCC) 01/06/2009    Past Surgical History:  Procedure Laterality Date   BLADDER SURGERY     extension   COLONOSCOPY  08/13/2009   COLONOSCOPY  02/25/2022   FINGER SURGERY     left x 3   GREAT TOE ARTHRODESIS, INTERPHALANGEAL JOINT     KNEE ARTHROSCOPY     left   PROSTATECTOMY     TONSILLECTOMY         Home Medications    Prior to Admission medications   Medication Sig Start Date End Date Taking? Authorizing Provider  amLODipine  (NORVASC ) 5 MG tablet Take 1 tablet (5 mg total) by mouth daily for blood pressure. 12/30/23  Yes   Choline Fenofibrate  (TRILIPIX ) 135 MG capsule Take 1 capsule (135 mg total) by mouth daily for cholesterol 10/03/23  Yes   Fluticasone -Umeclidin-Vilant (TRELEGY ELLIPTA ) 100-62.5-25 MCG/ACT AEPB Inhale 1 puff into the lungs daily. 01/26/24  Yes Rosa College D, MD  montelukast  (SINGULAIR ) 10 MG tablet 1 tab by mouth daily 10/27/23  Yes   omeprazole  (PRILOSEC) 20 MG capsule Take 1 capsule (20 mg total) by mouth daily. 10/09/23  Yes   PARoxetine  (PAXIL ) 30 MG tablet Take 2 tablets (60 mg total) by mouth daily. 12/11/23  Yes   propranolol  ER (INDERAL  LA) 80 MG 24 hr capsule Take 1 capsule (80 mg total) by mouth daily. 10/09/23  Yes   albuterol  (VENTOLIN  HFA) 108 (90 Base) MCG/ACT inhaler Inhale 2 puffs into the lungs every 6 (six) hours as needed for wheezing or shortness of breath. 10/31/23   Wilfredo Hanly, MD  aspirin  81 MG tablet Take 81 mg by mouth daily.    [provider]  cetirizine (ZYRTEC) 10 MG tablet Take 10 mg by mouth daily.    [provider]  Evolocumab  (REPATHA ) 140 MG/ML SOSY Inject 140 mg into the skin every 14 (fourteen) days. 01/15/24   Madireddy, Daymon Evans, MD  fluticasone  (FLONASE ) 50 MCG/ACT nasal spray Place 2 sprays into both nostrils daily. 03/16/23     glucosamine-chondroitin 500-400 MG tablet Take 1 tablet by mouth 2 (two) times daily.    [provider]  ipratropium-albuterol  (DUONEB) 0.5-2.5  (3) MG/3ML SOLN Take 3 mLs by nebulization every 6 (six) hours as needed. 10/31/23   Wilfredo Hanly, MD  Multiple Vitamins-Minerals (MULTIVITAL PO) Take 1 tablet by mouth daily.    [provider]  OXYGEN  Take 2 L by mouth at bedtime. Uses with CPAP face mask and then as needed during the daytime.    [provider]  VITAMIN D PO Take 5,000 Units by mouth daily.    [provider]    Family History Family History  Problem Relation Age of Onset   Colon polyps Mother    Tremor Mother  Breast cancer Mother 41   Memory loss Mother        early 89s   Heart attack Father    Alcohol abuse Father    Heart disease Father    Breast cancer Maternal Grandmother    Dementia Maternal Grandmother    Colon cancer Maternal Grandfather    Esophageal cancer Neg Hx    Stomach cancer Neg Hx    Rectal cancer Neg Hx     Social History Social History   Tobacco Use   Smoking status: Former    Current packs/day: 0.00    Types: Cigarettes    Quit date: 09/26/1970    Years since quitting: 53.4   Smokeless tobacco: Never  Vaping Use   Vaping status: Never Used  Substance Use Topics   Alcohol use: No   Drug use: No     Allergies   Levofloxacin, Moxifloxacin, Ofloxacin, Rosuvastatin , and Topiramate    Review of Systems Review of Systems  Constitutional:  Negative for fever.  Respiratory:  Negative for cough.   Cardiovascular:  Positive for chest pain (right rib pain).  Gastrointestinal:  Negative for abdominal pain, constipation, diarrhea, nausea and vomiting.  Musculoskeletal:  Positive for myalgias (right elbow and forearm). Negative for arthralgias and back pain.  Skin:  Negative for color change and rash.  Neurological:  Negative for syncope.  All other systems reviewed and are negative.    Physical Exam Triage Vital Signs ED Triage Vitals  Encounter Vitals Group     BP 03/13/24 1029 110/63     Girls Systolic BP Percentile --      Girls Diastolic BP  Percentile --      Boys Systolic BP Percentile --      Boys Diastolic BP Percentile --      Pulse Rate 03/13/24 1029 72     Resp 03/13/24 1029 18     Temp 03/13/24 1029 97.9 F (36.6 C)     Temp Source 03/13/24 1029 Oral     SpO2 03/13/24 1029 94 %     Weight --      Height --      Head Circumference --      Peak Flow --      Pain Score 03/13/24 1027 2     Pain Loc --      Pain Education --      Exclude from Growth Chart --    No data found.  Updated Vital Signs BP 110/63 (BP Location: Right Arm)   Pulse 72   Temp 97.9 F (36.6 C) (Oral)   Resp 18   SpO2 94%   Visual Acuity Right Eye Distance:   Left Eye Distance:   Bilateral Distance:    Right Eye Near:   Left Eye Near:    Bilateral Near:     Physical Exam Vitals and nursing note reviewed.  Constitutional:      General: He is not in acute distress.    Appearance: He is well-developed. He is not ill-appearing or toxic-appearing.  HENT:     Head: Normocephalic and atraumatic.     Right Ear: Hearing, tympanic membrane, ear canal and external ear normal.     Left Ear: Hearing, tympanic membrane, ear canal and external ear normal.     Nose: No congestion or rhinorrhea.     Right Sinus: No maxillary sinus tenderness or frontal sinus tenderness.     Left Sinus: No maxillary sinus tenderness or frontal sinus tenderness.  Mouth/Throat:     Lips: Pink.     Mouth: Mucous membranes are moist.     Pharynx: Uvula midline. No oropharyngeal exudate or posterior oropharyngeal erythema.     Tonsils: No tonsillar exudate.   Eyes:     Conjunctiva/sclera: Conjunctivae normal.     Pupils: Pupils are equal, round, and reactive to light.    Cardiovascular:     Rate and Rhythm: Normal rate and regular rhythm.     Heart sounds: S1 normal and S2 normal. No murmur heard. Pulmonary:     Effort: Pulmonary effort is normal. No respiratory distress.     Breath sounds: Normal breath sounds. No decreased breath sounds, wheezing,  rhonchi or rales.  Chest:     Chest wall: Swelling (right anterior ribs from #3 to #6 with minimal ecchymosis) and tenderness (right anterior ribs from #3 to #6) present.  Abdominal:     General: Bowel sounds are normal.     Palpations: Abdomen is soft.     Tenderness: There is no abdominal tenderness.   Musculoskeletal:        General: No swelling.     Right shoulder: Normal.     Left shoulder: Normal.     Right upper arm: Normal.     Left upper arm: Normal.     Right elbow: Swelling (A swollen area at the right elbow extending to the right forearm with ecchymosis.) present. Normal range of motion. Tenderness (Mildly swollen area with ecchymosis and tenderness at elbow and right forearm.) present.     Left elbow: Normal.     Right forearm: Swelling (some swelling and ecchymosis from right elbow to mid forearm) and tenderness (a swollen, tender area from right elbow to mid right forearm.) present.     Left forearm: Normal.     Right wrist: Normal.     Left wrist: Normal.     Right hand: Normal.     Left hand: Normal.     Cervical back: Neck supple.  Lymphadenopathy:     Head:     Right side of head: No submental, submandibular, tonsillar, preauricular or posterior auricular adenopathy.     Left side of head: No submental, submandibular, tonsillar, preauricular or posterior auricular adenopathy.     Cervical: No cervical adenopathy.     Right cervical: No superficial cervical adenopathy.    Left cervical: No superficial cervical adenopathy.   Skin:    General: Skin is warm and dry.     Capillary Refill: Capillary refill takes less than 2 seconds.     Findings: No rash.   Neurological:     Mental Status: He is alert and oriented to person, place, and time.   Psychiatric:        Mood and Affect: Mood normal.         UC Treatments / Results  Labs (all labs ordered are listed, but only abnormal results are displayed) Basic metabolic panel:10/28/23:    Component Ref  Range & Units (hover) 4 mo ago (10/28/23)  Sodium 136  Potassium 3.7  Chloride 106  CO2 22  Glucose, Bld 83  Comment: Glucose reference range applies only to samples taken after fasting for at least 8 hours.  BUN 21  Creatinine, Ser 0.89  Calcium  8.4 Low   GFR, Estimated >60  Comment: (NOTE) Calculated using the CKD-EPI Creatinine Equation (2021)  Anion gap 8  Comment: Performed at [facility], [address], [place], [place] [address]  Resulting  [facility]      EKG   Radiology DG Ribs Unilateral W/Chest Right Result Date: 03/13/2024 CLINICAL DATA:  Marvell Slider 6 days ago, right anterior rib pain EXAM: RIGHT RIBS AND CHEST - 3+ VIEW COMPARISON:  10/27/2023 FINDINGS: Frontal view of the chest as well as frontal and oblique views of the right thoracic cage are obtained. Cardiac silhouette is unremarkable. No airspace disease, effusion, or pneumothorax. No acute displaced fracture. IMPRESSION: 1. No acute intrathoracic process.  No displaced rib fracture. Electronically Signed   By: Bobbye Burrow M.D.   On: 03/13/2024 13:43    Procedures Procedures (including critical care time)  Medications Ordered in UC Medications - No data to display  Initial Impression / Assessment and Plan / UC Course  I have reviewed the triage vital signs and the nursing notes.  Pertinent labs & imaging results that were available during my care of the patient were reviewed by me and considered in my medical decision making (see chart for details).  Plan of Care: Contusion of right elbow and right rib pain: Chest x-ray was negative.  Patient has ibuprofen  800 mg at home and will take that every 8-12 hours as needed for pain.  He has an incentive spirometer at home and will use that with a target of the 1000 to 2000 mm inhalations, 10 times, every 1-2 hours for a week to try to prevent pneumonia.  Follow-up with primary care for recheck or if symptoms do not improve, worsen or new  symptoms occur.  I reviewed the plan of care with the patient and/or the patient's guardian.  The patient and/or guardian had time to ask questions and acknowledged that the questions were answered.  I provided instruction on symptoms or reasons to return here or to go to an ER, if symptoms/condition did not improve, worsened or if new symptoms occurred.  Final Clinical Impressions(s) / UC Diagnoses   Final diagnoses:  Contusion of right elbow and forearm, initial encounter  Rib pain on right side     Discharge Instructions      Chest & Rib X-Rays appear negative/normal.  Patient will be called, if the Radiologist gives a differing result.  May use Ibuprofen  800mg  every 8-12 hours, if needed for rib pain.  Encouraged to use Incentive Spirometer, inhale 1,000-2,000 ml for ten times, every one to two hours while awake to prevent pneumonia.  Follow-up with AWells Halim, FNP or here, if symptoms do not improve, worsen or new symptoms occur.     ED Prescriptions   None    PDMP not reviewed this encounter.   Guss Legacy, FNP 03/13/24 2012

## 2024-03-27 ENCOUNTER — Other Ambulatory Visit (HOSPITAL_COMMUNITY): Payer: Self-pay

## 2024-04-12 ENCOUNTER — Other Ambulatory Visit (HOSPITAL_COMMUNITY): Payer: Self-pay

## 2024-04-12 ENCOUNTER — Other Ambulatory Visit: Payer: Self-pay

## 2024-04-12 MED ORDER — AMLODIPINE BESYLATE 5 MG PO TABS
5.0000 mg | ORAL_TABLET | Freq: Every day | ORAL | 1 refills | Status: AC
  Filled 2024-04-26 – 2024-06-27 (×2): qty 90, 90d supply, fill #0
  Filled 2024-09-23 (×2): qty 90, 90d supply, fill #1

## 2024-04-17 ENCOUNTER — Telehealth: Payer: Self-pay

## 2024-04-17 ENCOUNTER — Other Ambulatory Visit (HOSPITAL_COMMUNITY): Payer: Self-pay

## 2024-04-17 NOTE — Telephone Encounter (Addendum)
 John,   Can you please review this patient charts for [facility] clearance. Per his med list he takes 2 L by mouth at bedtime. Uses with CPAP face mask and then as needed during the daytime. I am not sure if he is doing this instead of CPAP machine can he be done here or does he need to be done in the hospital setting. It does look like he was cleared for care here his last colonoscopy I just want to reconfirm   Thank you, PV

## 2024-04-25 ENCOUNTER — Telehealth: Payer: Self-pay

## 2024-04-25 DIAGNOSIS — G4733 Obstructive sleep apnea (adult) (pediatric): Secondary | ICD-10-CM | POA: Diagnosis not present

## 2024-04-25 NOTE — Telephone Encounter (Signed)
 Information noted on PV chart

## 2024-04-25 NOTE — Telephone Encounter (Signed)
 Patient was transferred to me to schedule a New Patient appt.    He advised he needed to follow up to [facility] care with Dr. Sherrilee he was last seen for hypogonadism and ED  Last seen by McKenzie in 11/09/2020 Patient advised he has not been anyone since 2022 for any of these issues so no medical records are avalaible

## 2024-04-26 ENCOUNTER — Encounter: Payer: Self-pay | Admitting: Pharmacist

## 2024-04-26 ENCOUNTER — Other Ambulatory Visit: Payer: Self-pay

## 2024-04-26 ENCOUNTER — Ambulatory Visit

## 2024-04-26 ENCOUNTER — Encounter: Payer: Self-pay | Admitting: Gastroenterology

## 2024-04-26 ENCOUNTER — Other Ambulatory Visit (HOSPITAL_COMMUNITY): Payer: Self-pay

## 2024-04-26 VITALS — Ht 70.0 in | Wt 190.0 lb

## 2024-04-26 DIAGNOSIS — Z860100 Personal history of colon polyps, unspecified: Secondary | ICD-10-CM

## 2024-04-26 MED ORDER — PEG 3350-KCL-NA BICARB-NACL 420 G PO SOLR: 4000.0000 mL | Freq: Once | ORAL | 0 refills | Status: AC

## 2024-04-26 NOTE — Progress Notes (Signed)
 No egg or soy allergy known to patient  No issues known to pt with past sedation with any surgeries or procedures Patient denies ever being told they had issues or difficulty with intubation  No FH of Malignant Hyperthermia Pt is not on diet pills Pt is on  home 02 at bedtime via CPAP  Pt is not on blood thinners  Pt denies issues with constipation  No A fib or A flutter Have any cardiac testing pending-- no  LOA: independent  Prep: suprep   [name] chart reviewed by Norleen Schillings CNRA prior to previsit and patient appropriate for the [facility].  Previsit completed and red dot placed by patient's name on their procedure day (on provider's schedule).     PV completed with patient. Prep instructions sent via mychart and home address.

## 2024-05-03 DIAGNOSIS — I700 Atherosclerosis of aorta: Secondary | ICD-10-CM | POA: Diagnosis not present

## 2024-05-03 DIAGNOSIS — I7121 Aneurysm of the ascending aorta, without rupture: Secondary | ICD-10-CM | POA: Diagnosis not present

## 2024-05-03 DIAGNOSIS — J449 Chronic obstructive pulmonary disease, unspecified: Secondary | ICD-10-CM | POA: Diagnosis not present

## 2024-05-03 DIAGNOSIS — I10 Essential (primary) hypertension: Secondary | ICD-10-CM | POA: Diagnosis not present

## 2024-05-03 DIAGNOSIS — I77811 Abdominal aortic ectasia: Secondary | ICD-10-CM | POA: Diagnosis not present

## 2024-05-03 DIAGNOSIS — G250 Essential tremor: Secondary | ICD-10-CM | POA: Diagnosis not present

## 2024-05-03 DIAGNOSIS — Z6829 Body mass index (BMI) 29.0-29.9, adult: Secondary | ICD-10-CM | POA: Diagnosis not present

## 2024-05-03 DIAGNOSIS — E785 Hyperlipidemia, unspecified: Secondary | ICD-10-CM | POA: Diagnosis not present

## 2024-05-03 DIAGNOSIS — F330 Major depressive disorder, recurrent, mild: Secondary | ICD-10-CM | POA: Diagnosis not present

## 2024-05-03 DIAGNOSIS — E559 Vitamin D deficiency, unspecified: Secondary | ICD-10-CM | POA: Diagnosis not present

## 2024-05-03 DIAGNOSIS — M545 Low back pain, unspecified: Secondary | ICD-10-CM | POA: Diagnosis not present

## 2024-05-16 ENCOUNTER — Ambulatory Visit: Admitting: Gastroenterology

## 2024-05-16 ENCOUNTER — Encounter: Payer: Self-pay | Admitting: Gastroenterology

## 2024-05-16 VITALS — BP 123/68 | HR 60 | Temp 97.4°F | Resp 14 | Ht 70.0 in | Wt 190.0 lb

## 2024-05-16 DIAGNOSIS — Z1211 Encounter for screening for malignant neoplasm of colon: Secondary | ICD-10-CM | POA: Diagnosis not present

## 2024-05-16 DIAGNOSIS — D122 Benign neoplasm of ascending colon: Secondary | ICD-10-CM | POA: Diagnosis not present

## 2024-05-16 DIAGNOSIS — J449 Chronic obstructive pulmonary disease, unspecified: Secondary | ICD-10-CM | POA: Diagnosis not present

## 2024-05-16 DIAGNOSIS — K5730 Diverticulosis of large intestine without perforation or abscess without bleeding: Secondary | ICD-10-CM

## 2024-05-16 DIAGNOSIS — K640 First degree hemorrhoids: Secondary | ICD-10-CM | POA: Diagnosis not present

## 2024-05-16 DIAGNOSIS — G4733 Obstructive sleep apnea (adult) (pediatric): Secondary | ICD-10-CM | POA: Diagnosis not present

## 2024-05-16 DIAGNOSIS — Z860100 Personal history of colon polyps, unspecified: Secondary | ICD-10-CM

## 2024-05-16 DIAGNOSIS — I10 Essential (primary) hypertension: Secondary | ICD-10-CM | POA: Diagnosis not present

## 2024-05-16 DIAGNOSIS — K635 Polyp of colon: Secondary | ICD-10-CM | POA: Diagnosis not present

## 2024-05-16 MED ORDER — SODIUM CHLORIDE 0.9 % IV SOLN: 500.0000 mL | Freq: Once | INTRAVENOUS | Status: DC

## 2024-05-16 NOTE — Progress Notes (Signed)
 Report given to PACU, vss

## 2024-05-16 NOTE — Progress Notes (Signed)
 [facility] History and Physical   Primary Care Physician:  Erick Greig LABOR, NP   Reason for Procedure:   H/O polyps  Plan:    colon     HPI: Daniel Terrell is a 78 y.o. 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  The report date is 06/16/2023.     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   Nocturnal hypoxemia 10/03/2022   uses O2 at night for COPD   Obstructive sleep apnea 01/06/2009   NPSG 01/12/03 AHI 42/ hr, loud snore, weight 200 lbs  Could not get comfortable with CPAP      OCD (obsessive compulsive disorder)    Pain in right hand 07/25/2022   PONV (postoperative nausea and vomiting)    Prostate cancer (HCC)    Pure hypercholesterolemia 04/30/2009   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 Authorizing Provider  amLODipine  (NORVASC ) 5 MG tablet Take 1 tablet (5 mg total) by mouth daily for blood pressure. 04/12/24  Yes   aspirin  81 MG tablet Take 81 mg by mouth daily.   Yes [provider]  cetirizine (ZYRTEC) 10 MG tablet Take 10 mg by mouth daily.   Yes [provider]  fluticasone  (FLONASE ) 50 MCG/ACT nasal spray Place 2 sprays into both nostrils daily. 03/16/23  Yes   Fluticasone -Umeclidin-Vilant (TRELEGY ELLIPTA ) 100-62.5-25 MCG/ACT AEPB Inhale 1 puff into the lungs daily. 01/26/24  Yes Young, Reggy D, MD  glucosamine-chondroitin 500-400 MG tablet Take 1 tablet by mouth 2 (two) times daily.   Yes [provider]  ibuprofen  (ADVIL ) 800 MG tablet Take 800 mg by mouth every 8 (eight) hours as needed. 05/03/24  Yes [provider]  montelukast  (SINGULAIR ) 10 MG tablet Take 1 tablet (10 mg total) by mouth daily. 10/27/23  Yes   Multiple Vitamins-Minerals (MULTIVITAL PO) Take 1 tablet by mouth daily.   Yes [provider]  omeprazole  (PRILOSEC) 20 MG capsule Take 1 capsule (20 mg total) by mouth daily. 10/09/23  Yes   OXYGEN  Take 2 L by mouth at bedtime. Uses with CPAP face mask and then as needed during the daytime.   Yes [provider]  PARoxetine  (PAXIL ) 30 MG tablet Take 2 tablets (60 mg total) by mouth daily. 12/11/23  Yes   propranolol  ER (INDERAL  LA) 80 MG 24 hr capsule Take 1 capsule (80 mg total) by mouth daily. 10/09/23  Yes   VITAMIN D PO Take 5,000 Units  by mouth daily.   Yes [provider]  albuterol  (VENTOLIN  HFA) 108 (90 Base) MCG/ACT inhaler Inhale 2 puffs into the lungs every 6 (six) hours as needed for wheezing or shortness of breath. 10/31/23   Kara Dorn NOVAK, MD  Choline Fenofibrate  (TRILIPIX ) 135 MG capsule Take 1 capsule (135 mg total) by mouth daily for cholesterol 10/03/23     Evolocumab  (REPATHA ) 140 MG/ML SOSY Inject 140 mg into the skin every 14 (fourteen) days. Patient not taking: Reported on 04/26/2024 01/15/24   Madireddy, Alean SAUNDERS, MD  ipratropium-albuterol  (DUONEB) 0.5-2.5 (3) MG/3ML SOLN Take 3 mLs by nebulization every 6 (six) hours as needed. 10/31/23   Kara Dorn NOVAK, MD    Current Outpatient Medications  Medication Sig Dispense Refill   amLODipine  (NORVASC ) 5 MG tablet Take 1 tablet (5 mg total) by mouth daily for blood pressure. 90 tablet 1   aspirin  81 MG tablet Take 81 mg by mouth daily.     cetirizine (ZYRTEC) 10 MG tablet Take 10 mg by mouth daily.     fluticasone  (FLONASE ) 50 MCG/ACT nasal spray Place 2 sprays into both nostrils daily. 48 g 3   Fluticasone -Umeclidin-Vilant (TRELEGY ELLIPTA ) 100-62.5-25 MCG/ACT AEPB Inhale 1 puff into the lungs daily. 180 each 1   glucosamine-chondroitin 500-400 MG tablet Take 1 tablet by mouth 2 (two) times daily.     ibuprofen  (ADVIL ) 800 MG tablet Take 800 mg by mouth every 8 (eight) hours as needed.     montelukast  (SINGULAIR ) 10 MG tablet Take 1 tablet (10 mg total) by mouth daily. 90 tablet 3   Multiple Vitamins-Minerals (MULTIVITAL PO) Take 1 tablet by mouth daily.     omeprazole  (PRILOSEC) 20 MG capsule Take 1 capsule (20 mg total) by mouth daily. 90 capsule 4   OXYGEN  Take 2 L by mouth at bedtime. Uses with CPAP face mask and then as needed during the daytime.     PARoxetine  (PAXIL ) 30 MG tablet Take 2 tablets (60 mg total) by mouth daily. 180 tablet 1   propranolol  ER (INDERAL  LA) 80 MG 24 hr capsule Take 1 capsule (80 mg total) by mouth daily. 90 capsule 4    VITAMIN D PO Take 5,000 Units by mouth daily.     albuterol  (VENTOLIN  HFA) 108 (90 Base) MCG/ACT inhaler Inhale 2 puffs into the lungs every 6 (six) hours as needed for wheezing or shortness of breath. 8 g 6   Choline Fenofibrate  (TRILIPIX ) 135 MG capsule Take 1 capsule (135 mg total) by mouth daily for cholesterol 90 capsule 3   Evolocumab  (REPATHA ) 140 MG/ML SOSY Inject 140 mg into the skin every 14 (fourteen) days. (Patient not taking: Reported on 04/26/2024) 2 mL 2  ipratropium-albuterol  (DUONEB) 0.5-2.5 (3) MG/3ML SOLN Take 3 mLs by nebulization every 6 (six) hours as needed. 360 mL 11   Current Facility-Administered Medications  Medication Dose Route Frequency Provider Last Rate Last Admin   0.9 %  sodium chloride  infusion  500 mL Intravenous Once Charlanne Groom, MD        Allergies as of 05/16/2024 - Review Complete 05/16/2024  Allergen Reaction Noted   Levofloxacin Other (See Comments) 01/06/2009   Moxifloxacin Other (See Comments) 06/03/2014   Ofloxacin Other (See Comments) 01/06/2009   Rosuvastatin  Other (See Comments) 04/05/2023   Rosuvastatin  calcium  Other (See Comments) 02/23/2021   Topiramate  Other (See Comments) 03/22/2017    Family History  Problem Relation Age of Onset   Colon polyps Mother    Tremor Mother    Breast cancer Mother 50   Memory loss Mother        early 67s   Heart attack Father    Alcohol abuse Father    Heart disease Father    Breast cancer Maternal Grandmother    Dementia Maternal Grandmother    Colon cancer Maternal Grandfather    Esophageal cancer Neg Hx    Stomach cancer Neg Hx    Rectal cancer Neg Hx     Social History   Socioeconomic History   Marital status: Married    Spouse name: Idell   Number of children: 3   Years of education: 16   Highest education level: Bachelor's degree (e.g., BA, AB, BS)  Occupational History   Occupation: Retired    Comment: Education officer, environmental  Tobacco Use   Smoking status: Former    Current packs/day: 0.00     Types: Cigarettes    Quit date: 09/26/1970    Years since quitting: 53.6   Smokeless tobacco: Never  Vaping Use   Vaping status: Never Used  Substance and Sexual Activity   Alcohol use: No   Drug use: No   Sexual activity: Not on file  Other Topics Concern   Not on file  Social History Narrative   Patient is married with 3 children.   Patient is right handed.   Patient has a Bachelor's degree.   Patient drinks 2-3 cups daily.      Social Drivers of Corporate investment banker Strain: Not on file  Food Insecurity: Not on file  Transportation Needs: Not on file  Physical Activity: Not on file  Stress: Not on file  Social Connections: Not on file  Intimate Partner Violence: Not on file    Review of Systems: Positive for none All other review of systems negative except as mentioned in the HPI.  Physical Exam: Vital signs in last 24 hours: @VSRANGES @   General:   Alert,  Well-developed, well-nourished, pleasant and cooperative in NAD Lungs:  Clear throughout to auscultation.   Heart:  Regular rate and rhythm; no murmurs, clicks, rubs,  or gallops. Abdomen:  Soft, nontender and nondistended. Normal bowel sounds.   Neuro/Psych:  Alert and cooperative. Normal mood and affect. A and O x 3    No significant changes were identified.  The patient continues to be an appropriate candidate for the planned procedure and anesthesia.   Anselm Charlanne, MD. [facility] 05/16/2024 10:38 AM@

## 2024-05-16 NOTE — Progress Notes (Signed)
 Vitals-DT  Pt's states no medical or surgical changes since previsit or office visit.

## 2024-05-16 NOTE — Op Note (Signed)
 [facility] Patient Name: Daniel Terrell Procedure Date: 05/16/2024 10:26 AM MRN: [id number] Endoscopist: Lynnie Bring , MD, [id number] Age: 78 Referring MD:  Date of Birth: 06-Jan-1946 Gender: Male Account #: [id number] Procedure:                Colonoscopy Indications:              High risk colon cancer surveillance: Personal                            history of colonic polyps- prev 17 polyps 04/2023,                            14 polyps 02/2022 Medicines:                Monitored Anesthesia Care Procedure:                Pre-Anesthesia Assessment:                           - Prior to the procedure, a History and Physical                            was performed, and patient medications and                            allergies were reviewed. The patient's tolerance of                            previous anesthesia was also reviewed. The risks                            and benefits of the procedure and the sedation                            options and risks were discussed with the patient.                            All questions were answered, and informed consent                            was obtained. Prior Anticoagulants: The patient has                            taken no anticoagulant or antiplatelet agents. ASA                            Grade Assessment: II - A patient with mild systemic                            disease. After reviewing the risks and benefits,                            the patient was deemed in satisfactory condition to  undergo the procedure.                           After obtaining informed consent, the colonoscope                            was passed under direct vision. Throughout the                            procedure, the patient's blood pressure, pulse, and                            oxygen  saturations were monitored continuously. The                            Olympus CF-HQ190L ([id number]) Colonoscope was                             introduced through the anus and advanced to the the                            cecum, identified by appendiceal orifice and                            ileocecal valve. The colonoscopy was performed                            without difficulty. The patient tolerated the                            procedure well. The quality of the bowel                            preparation was good. The ileocecal valve,                            appendiceal orifice, and rectum were photographed. Scope In: 10:40:53 AM Scope Out: 10:52:37 AM Scope Withdrawal Time: 0 hours 9 minutes 39 seconds  Total Procedure Duration: 0 hours 11 minutes 44 seconds  Findings:                 Three sessile polyps were found in the proximal                            ascending colon and mid ascending colon. The polyps                            were 3 to 4 mm in size. These polyps were removed                            with a cold snare. Resection and retrieval were                            complete.  Many medium-mouthed diverticula were found in the                            sigmoid colon and ascending colon.                           Non-bleeding internal hemorrhoids were found during                            retroflexion. The hemorrhoids were small and Grade                            I (internal hemorrhoids that do not prolapse).                           Retroflexion in the right colon was performed.                           The exam was otherwise without abnormality on                            direct and retroflexion views. Complications:            No immediate complications. Estimated Blood Loss:     Estimated blood loss: none. Impression:               - Three 3 to 4 mm polyps in the proximal ascending                            colon and in the mid ascending colon, removed with                            a cold snare. Resected and retrieved.                            - Mild pancolonic diverticulosis.                           - Non-bleeding internal hemorrhoids.                           - The examination was otherwise normal on direct                            and retroflexion views. Recommendation:           - Patient has a contact number available for                            emergencies. The signs and symptoms of potential                            delayed complications were discussed with the                            patient. Return to normal activities  tomorrow.                            Written discharge instructions were provided to the                            patient.                           - Resume previous diet.                           - Continue present medications.                           - Await pathology results.                           - Repeat colonoscopy for surveillance based on                            pathology results.                           - The findings and recommendations were discussed                            with the patient's family. Lynnie Bring, MD 05/16/2024 10:57:30 AM This report has been signed electronically.

## 2024-05-16 NOTE — Patient Instructions (Signed)
 Resume previous diet. Continue present medications. Awaiting pathology results. Repeat colonoscopy for surveillance based on pathology results. Handouts provided on polyps, hemorrhoids, and diverticulosis.  YOU HAD AN ENDOSCOPIC PROCEDURE TODAY AT THE [facility]:   Refer to the procedure report that was given to you for any specific questions about what was found during the examination.  If the procedure report does not answer your questions, please call your gastroenterologist to clarify.  If you requested that your care partner not be given the details of your procedure findings, then the procedure report has been included in a sealed envelope for you to review at your convenience later.  YOU SHOULD EXPECT: Some feelings of bloating in the abdomen. Passage of more gas than usual.  Walking can help get rid of the air that was put into your GI tract during the procedure and reduce the bloating. If you had a lower endoscopy (such as a colonoscopy or flexible sigmoidoscopy) you may notice spotting of blood in your stool or on the toilet paper. If you underwent a bowel prep for your procedure, you may not have a normal bowel movement for a few days.  Please Note:  You might notice some irritation and congestion in your nose or some drainage.  This is from the oxygen used during your procedure.  There is no need for concern and it should clear up in a day or so.  SYMPTOMS TO REPORT IMMEDIATELY:  Following lower endoscopy (colonoscopy or flexible sigmoidoscopy):  Excessive amounts of blood in the stool  Significant tenderness or worsening of abdominal pains  Swelling of the abdomen that is new, acute  Fever of 100F or higher  For urgent or emergent issues, a gastroenterologist can be reached at any hour by calling [phone]. Do not use MyChart messaging for urgent concerns.    DIET:  We do recommend a small meal at first, but then you may proceed to your regular diet.  Drink  plenty of fluids but you should avoid alcoholic beverages for 24 hours.  ACTIVITY:  You should plan to take it easy for the rest of today and you should NOT DRIVE or use heavy machinery until tomorrow (because of the sedation medicines used during the test).    FOLLOW UP: Our staff will call the number listed on your records the next business day following your procedure.  We will call around 7:15- 8:00 am to check on you and address any questions or concerns that you may have regarding the information given to you following your procedure. If we do not reach you, we will leave a message.     If any biopsies were taken you will be contacted by phone or by letter within the next 1-3 weeks.  Please call us  at [phone] if you have not heard about the biopsies in 3 weeks.    SIGNATURES/CONFIDENTIALITY: You and/or your care partner have signed paperwork which will be entered into your electronic medical record.  These signatures attest to the fact that that the information above on your After Visit Summary has been reviewed and is understood.  Full responsibility of the confidentiality of this discharge information lies with you and/or your care-partner.

## 2024-05-16 NOTE — Progress Notes (Signed)
 Called to room to assist during endoscopic procedure.  Patient ID and intended procedure confirmed with present staff. Received instructions for my participation in the procedure from the performing physician.

## 2024-05-17 ENCOUNTER — Telehealth: Payer: Self-pay

## 2024-05-17 NOTE — Telephone Encounter (Signed)
  Follow up Call-     05/16/2024   10:17 AM 05/16/2024   10:07 AM 04/28/2023   10:26 AM 02/25/2022    1:37 PM  Call back number  Post procedure Call Back phone  # [phone]  [phone]  Permission to leave phone message  Yes Yes Yes     Patient questions:  Do you have a fever, pain , or abdominal swelling? No. Pain Score  0 *  Have you tolerated food without any problems? Yes.    Have you been able to return to your normal activities? Yes.    Do you have any questions about your discharge instructions: Diet   No. Medications  No. Follow up visit  No.  Do you have questions or concerns about your Care? No.  Actions: * If pain score is 4 or above: No action needed, pain <4.

## 2024-05-20 LAB — SURGICAL PATHOLOGY

## 2024-05-31 ENCOUNTER — Encounter: Payer: Self-pay | Admitting: Urology

## 2024-05-31 ENCOUNTER — Ambulatory Visit (INDEPENDENT_AMBULATORY_CARE_PROVIDER_SITE_OTHER): Admitting: Urology

## 2024-05-31 VITALS — BP 122/72 | HR 72

## 2024-05-31 DIAGNOSIS — N529 Male erectile dysfunction, unspecified: Secondary | ICD-10-CM

## 2024-05-31 DIAGNOSIS — E291 Testicular hypofunction: Secondary | ICD-10-CM | POA: Diagnosis not present

## 2024-05-31 MED ORDER — AMBULATORY NON FORMULARY MEDICATION
0.2000 mL | 5 refills | Status: DC | PRN
Start: 2024-05-31 — End: 2024-05-31

## 2024-05-31 MED ORDER — AMBULATORY NON FORMULARY MEDICATION: 0.2000 mL | 5 refills | Status: AC | PRN

## 2024-05-31 NOTE — Addendum Note (Signed)
 Addended by: MALACHY SLICE on: 05/31/2024 02:56 PM   Modules accepted: Orders

## 2024-05-31 NOTE — Progress Notes (Signed)
 05/31/2024 11:45 AM   Daniel Terrell 05/29/1946 [id number]  Referring provider: Erick Greig Daniel Terrell [address] [place] Terrell,  Daniel Terrell [address]  Erectile dysfunction and hypogonadism   HPI: [name] is a 78yo here for evaluation of erectile dysfunction and hypogonadism. He was previously seen in 2022 for hypogonadism and erectile dysfunction. He is no longer on trimix injections due to pain and curvature. He used to use VED.  He is no longer on TRT due to hair loss. IPSS 19 QOL 3 on no BPH therapy.      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  The report date is 06/16/2023.     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   Nocturnal hypoxemia 10/03/2022   uses O2 at night for COPD    Obstructive sleep apnea 01/06/2009   NPSG 01/12/03 AHI 42/ hr, loud snore, weight 200 lbs  Could not get comfortable with CPAP      OCD (obsessive compulsive disorder)    Pain in right hand 07/25/2022   PONV (postoperative nausea and vomiting)    Prostate cancer (HCC)    Pure hypercholesterolemia 04/30/2009   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   Restless legs syndrome (RLS) 11/28/2013   Seasonal and perennial allergic rhinitis 01/06/2009    Surgical History: Past Surgical History:  Procedure Laterality Date   BLADDER SURGERY     extension   COLONOSCOPY  08/13/2009   COLONOSCOPY  02/25/2022   FINGER SURGERY     left x 3   GREAT TOE ARTHRODESIS, INTERPHALANGEAL JOINT     KNEE ARTHROSCOPY     left   PROSTATECTOMY     TONSILLECTOMY      Home Medications:  Allergies as of 05/31/2024       Reactions   Levofloxacin Other (See Comments)   REACTION: disorientation Other Reaction(s): confusion   Moxifloxacin Other (See Comments)   Other Reaction(s): confusion   Ofloxacin Other (See Comments)   REACTION: disorientation Other Reaction(s): confusion Other Reaction(s): confusion, confusion, confusion   Rosuvastatin  Other (See Comments)   Other Reaction(s): myalgias (muscle pain)   Rosuvastatin  Calcium  Other (See Comments)   Muscle pain Other Reaction(s): Myalgias rosuvastatin  calcium    Topiramate  Other (See Comments)   drowsy topiramate   Medication List        Accurate as of May 31, 2024 11:45 AM. If you have any questions, ask your nurse or doctor.          albuterol  108 (90 Base) MCG/ACT inhaler Commonly known as: VENTOLIN  HFA Inhale 2 puffs into the lungs every 6 (six) hours as needed for wheezing or shortness of breath.   amLODipine  5 MG tablet Commonly known as: NORVASC  Take 1 tablet (5 mg total) by mouth daily for blood pressure.   aspirin  81 MG tablet Take 81 mg by mouth daily.   cetirizine 10 MG tablet Commonly known as: ZYRTEC Take  10 mg by mouth daily.   Fenofibric Acid  135 MG Cpdr Take 1 capsule (135 mg total) by mouth daily for cholesterol   fluticasone  50 MCG/ACT nasal spray Commonly known as: FLONASE  Place 2 sprays into both nostrils daily.   glucosamine-chondroitin 500-400 MG tablet Take 1 tablet by mouth 2 (two) times daily.   ibuprofen  800 MG tablet Commonly known as: ADVIL  Take 800 mg by mouth every 8 (eight) hours as needed.   ipratropium-albuterol  0.5-2.5 (3) MG/3ML Soln Commonly known as: DUONEB Take 3 mLs by nebulization every 6 (six) hours as needed.   montelukast  10 MG tablet Commonly known as: Singulair  Take 1 tablet (10 mg total) by mouth daily.   MULTIVITAL PO Take 1 tablet by mouth daily.   omeprazole  20 MG capsule Commonly known as: PRILOSEC Take 1 capsule (20 mg total) by mouth daily.   OXYGEN  Take 2 L by mouth at bedtime. Uses with CPAP face mask and then as needed during the daytime.   PARoxetine  30 MG tablet Commonly known as: PAXIL  Take 2 tablets (60 mg total) by mouth daily.   propranolol  ER 80 MG 24 hr capsule Commonly known as: INDERAL  LA Take 1 capsule (80 mg total) by mouth daily.   Repatha  140 MG/ML Sosy Generic drug: Evolocumab  Inject 140 mg into the skin every 14 (fourteen) days.   Trelegy Ellipta  100-62.5-25 MCG/ACT Aepb Generic drug: Fluticasone -Umeclidin-Vilant Inhale 1 puff into the lungs daily.   VITAMIN D PO Take 5,000 Units by mouth daily.        Allergies:  Allergies  Allergen Reactions   Levofloxacin Other (See Comments)    REACTION: disorientation  Other Reaction(s): confusion   Moxifloxacin Other (See Comments)    Other Reaction(s): confusion   Ofloxacin Other (See Comments)    REACTION: disorientation  Other Reaction(s): confusion  Other Reaction(s): confusion, confusion, confusion   Rosuvastatin  Other (See Comments)    Other Reaction(s): myalgias (muscle pain)   Rosuvastatin  Calcium  Other (See Comments)    Muscle  pain  Other Reaction(s): Myalgias  rosuvastatin  calcium    Topiramate  Other (See Comments)    drowsy  topiramate     Family History: Family History  Problem Relation Age of Onset   Colon polyps Mother    Tremor Mother    Breast cancer Mother 27   Memory loss Mother        early 10s   Heart attack Father    Alcohol abuse Father    Heart disease Father    Breast cancer Maternal Grandmother    Dementia Maternal Grandmother    Colon cancer Maternal Grandfather    Esophageal cancer Neg Hx    Stomach cancer Neg Hx    Rectal cancer Neg Hx     Social History:  reports that he quit smoking about 53 years ago. His smoking use included cigarettes. He  has never used smokeless tobacco. He reports that he does not drink alcohol and does not use drugs.  ROS: All other review of systems were reviewed and are negative except what is noted above in HPI  Physical Exam: BP 122/72   Pulse 72   Constitutional:  Alert and oriented, No acute distress. HEENT: [place] AT, moist mucus membranes.  Trachea midline, no masses. Cardiovascular: No clubbing, cyanosis, or edema. Respiratory: Normal respiratory effort, no increased work of breathing. GI: Abdomen is soft, nontender, nondistended, no abdominal masses GU: No CVA tenderness.  Lymph: No cervical or inguinal lymphadenopathy. Skin: No rashes, bruises or suspicious lesions. Neurologic: Grossly intact, no focal deficits, moving all 4 extremities. Psychiatric: Normal mood and affect.  Laboratory Data: Lab Results  Component Value Date   WBC 4.3 10/28/2023   HGB 11.9 (L) 10/28/2023   HCT 34.9 (L) 10/28/2023   MCV 90.2 10/28/2023   PLT 225 10/28/2023    Lab Results  Component Value Date   CREATININE 0.89 10/28/2023    No results found for: PSA  No results found for: TESTOSTERONE   No results found for: HGBA1C  Urinalysis    Component Value Date/Time   APPEARANCEUR Clear 11/09/2020 1409   GLUCOSEU Negative 11/09/2020 1409    BILIRUBINUR Negative 11/09/2020 1409   PROTEINUR Negative 11/09/2020 1409   NITRITE Negative 11/09/2020 1409   LEUKOCYTESUR Negative 11/09/2020 1409    Lab Results  Component Value Date   LABMICR Comment 11/09/2020    Pertinent Imaging:  No results found for this or any previous visit.  No results found for this or any previous visit.  No results found for this or any previous visit.  No results found for this or any previous visit.  No results found for this or any previous visit.  No results found for this or any previous visit.  No results found for this or any previous visit.  No results found for this or any previous visit.   Assessment & Plan:    1. 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  2. Erectile dysfunction, unspecified erectile dysfunction type -We discussed PDE5s, ICI, Muse, VES and IPP.  After discussing the options the patient elects for Trimix therapy.    No follow-ups on file.  Belvie Clara, MD  [facility] [place]

## 2024-05-31 NOTE — Patient Instructions (Signed)

## 2024-06-03 ENCOUNTER — Other Ambulatory Visit (HOSPITAL_COMMUNITY): Payer: Self-pay

## 2024-06-03 ENCOUNTER — Other Ambulatory Visit: Payer: Self-pay

## 2024-06-03 MED ORDER — FLUTICASONE PROPIONATE 50 MCG/ACT NA SUSP
2.0000 | Freq: Every day | NASAL | 4 refills | Status: AC
  Filled 2024-06-03: qty 48, 90d supply, fill #0
  Filled 2024-08-29 – 2024-09-02 (×2): qty 48, 90d supply, fill #1

## 2024-06-03 MED ORDER — PAROXETINE HCL 30 MG PO TABS
60.0000 mg | ORAL_TABLET | Freq: Every day | ORAL | 1 refills | Status: AC
  Filled 2024-06-03: qty 180, 90d supply, fill #0
  Filled 2024-06-13 – 2024-09-02 (×2): qty 180, 90d supply, fill #1

## 2024-06-04 ENCOUNTER — Telehealth: Payer: Self-pay | Admitting: Pharmacy Technician

## 2024-06-04 ENCOUNTER — Other Ambulatory Visit (HOSPITAL_BASED_OUTPATIENT_CLINIC_OR_DEPARTMENT_OTHER): Payer: Self-pay

## 2024-06-04 ENCOUNTER — Other Ambulatory Visit (HOSPITAL_COMMUNITY): Payer: Self-pay

## 2024-06-04 ENCOUNTER — Telehealth (HOSPITAL_COMMUNITY): Payer: Self-pay

## 2024-06-04 ENCOUNTER — Other Ambulatory Visit: Payer: Self-pay

## 2024-06-04 NOTE — Telephone Encounter (Signed)
 Pharmacy Patient Advocate Encounter  Received notification from [employer] that Prior Authorization for repatha  has been APPROVED from 06/04/24 to 12/01/24   PA #/Case ID/Reference #: [id number]

## 2024-06-04 NOTE — Telephone Encounter (Signed)
 Pharmacy Patient Advocate Encounter   Received notification from Patient Pharmacy that prior authorization for repatha  is required/requested.   Insurance verification completed.   The patient is insured through [facility] [employer] .   Per test claim: PA required; PA submitted to above mentioned insurance via Latent Key/confirmation #/EOC [id number] Status is pending

## 2024-06-05 ENCOUNTER — Other Ambulatory Visit (HOSPITAL_COMMUNITY): Payer: Self-pay

## 2024-06-08 ENCOUNTER — Other Ambulatory Visit (HOSPITAL_COMMUNITY): Payer: Self-pay

## 2024-06-09 ENCOUNTER — Ambulatory Visit: Payer: Self-pay | Admitting: Gastroenterology

## 2024-06-10 ENCOUNTER — Other Ambulatory Visit (HOSPITAL_COMMUNITY): Payer: Self-pay

## 2024-06-10 ENCOUNTER — Other Ambulatory Visit: Payer: Self-pay

## 2024-06-12 ENCOUNTER — Other Ambulatory Visit (HOSPITAL_COMMUNITY): Payer: Self-pay

## 2024-06-13 ENCOUNTER — Other Ambulatory Visit (HOSPITAL_BASED_OUTPATIENT_CLINIC_OR_DEPARTMENT_OTHER): Payer: Self-pay

## 2024-06-13 ENCOUNTER — Other Ambulatory Visit (HOSPITAL_COMMUNITY): Payer: Self-pay

## 2024-06-18 ENCOUNTER — Other Ambulatory Visit (HOSPITAL_COMMUNITY): Payer: Self-pay

## 2024-06-26 ENCOUNTER — Other Ambulatory Visit: Payer: Self-pay

## 2024-06-26 MED ORDER — REPATHA SURECLICK 140 MG/ML ~~LOC~~ SOAJ: 140.0000 mg | SUBCUTANEOUS | 12 refills | Status: DC

## 2024-06-28 ENCOUNTER — Other Ambulatory Visit: Payer: Self-pay

## 2024-06-28 ENCOUNTER — Other Ambulatory Visit (HOSPITAL_COMMUNITY): Payer: Self-pay

## 2024-06-28 ENCOUNTER — Telehealth: Payer: Self-pay

## 2024-06-28 DIAGNOSIS — I2510 Atherosclerotic heart disease of native coronary artery without angina pectoris: Secondary | ICD-10-CM

## 2024-06-28 DIAGNOSIS — E782 Mixed hyperlipidemia: Secondary | ICD-10-CM

## 2024-06-28 MED ORDER — REPATHA SURECLICK 140 MG/ML ~~LOC~~ SOAJ
1.0000 mL | SUBCUTANEOUS | 1 refills | Status: AC
  Filled 2024-06-28: qty 6, 84d supply, fill #0
  Filled 2024-09-23: qty 6, 84d supply, fill #1

## 2024-06-28 NOTE — Telephone Encounter (Signed)
*  STAT* If patient is at the pharmacy, call can be transferred to refill team.   1. Which medications need to be refilled? (please list name of each medication and dose if known)   Evolocumab  (REPATHA  SURECLICK) 140 MG/ML SOAJ     2. Would you like to learn more about the convenience, safety, & potential cost savings by using the [facility]? N/A   3. Are you open to using the [facility] (Type [facility]. ).N/A   4. Which pharmacy/location (including street and city if local pharmacy) is medication to be sent to? [place] [facility]    5. Do they need a 30 day or 90 day supply? 90 day supply     Previous medication sent to wrong pharmacy and pt requesting c/b once refill has been sent

## 2024-07-12 ENCOUNTER — Other Ambulatory Visit (HOSPITAL_COMMUNITY): Payer: Self-pay

## 2024-07-16 ENCOUNTER — Other Ambulatory Visit: Payer: Self-pay

## 2024-07-25 ENCOUNTER — Other Ambulatory Visit (HOSPITAL_COMMUNITY): Payer: Self-pay

## 2024-08-11 ENCOUNTER — Other Ambulatory Visit: Payer: Self-pay | Admitting: Internal Medicine

## 2024-08-12 ENCOUNTER — Other Ambulatory Visit: Payer: Self-pay

## 2024-08-12 ENCOUNTER — Other Ambulatory Visit (HOSPITAL_COMMUNITY): Payer: Self-pay

## 2024-08-12 MED ORDER — TRELEGY ELLIPTA 100-62.5-25 MCG/ACT IN AEPB
1.0000 | INHALATION_SPRAY | Freq: Every day | RESPIRATORY_TRACT | 0 refills | Status: DC
  Filled 2024-08-12: qty 180, 90d supply, fill #0

## 2024-08-29 ENCOUNTER — Other Ambulatory Visit (HOSPITAL_COMMUNITY): Payer: Self-pay

## 2024-09-02 ENCOUNTER — Other Ambulatory Visit (HOSPITAL_COMMUNITY): Payer: Self-pay

## 2024-09-09 ENCOUNTER — Other Ambulatory Visit: Payer: Self-pay

## 2024-09-09 ENCOUNTER — Other Ambulatory Visit (HOSPITAL_COMMUNITY): Payer: Self-pay

## 2024-09-09 MED ORDER — IBUPROFEN 800 MG PO TABS
800.0000 mg | ORAL_TABLET | Freq: Three times a day (TID) | ORAL | 4 refills | Status: AC | PRN
  Filled 2024-09-09: qty 90, 30d supply, fill #0

## 2024-09-09 MED ORDER — HYOSCYAMINE SULFATE SL 0.125 MG SL SUBL
0.1250 mg | SUBLINGUAL_TABLET | Freq: Four times a day (QID) | SUBLINGUAL | 1 refills | Status: AC | PRN
  Filled 2024-09-09: qty 30, 8d supply, fill #0

## 2024-09-10 ENCOUNTER — Other Ambulatory Visit: Payer: Self-pay

## 2024-09-10 ENCOUNTER — Other Ambulatory Visit (HOSPITAL_COMMUNITY): Payer: Self-pay

## 2024-09-23 ENCOUNTER — Other Ambulatory Visit (HOSPITAL_COMMUNITY): Payer: Self-pay

## 2024-09-24 ENCOUNTER — Other Ambulatory Visit: Payer: Self-pay

## 2024-10-07 ENCOUNTER — Other Ambulatory Visit (HOSPITAL_COMMUNITY): Payer: Self-pay

## 2024-10-07 ENCOUNTER — Other Ambulatory Visit: Payer: Self-pay | Admitting: Internal Medicine

## 2024-10-08 ENCOUNTER — Other Ambulatory Visit (HOSPITAL_COMMUNITY): Payer: Self-pay

## 2024-10-08 MED ORDER — OMEPRAZOLE 20 MG PO CPDR
20.0000 mg | DELAYED_RELEASE_CAPSULE | Freq: Every day | ORAL | 3 refills | Status: AC
  Filled 2024-10-08: qty 90, 90d supply, fill #0

## 2024-10-08 MED ORDER — PROPRANOLOL HCL ER 80 MG PO CP24
80.0000 mg | ORAL_CAPSULE | Freq: Every day | ORAL | 3 refills | Status: AC
  Filled 2024-10-08: qty 90, 90d supply, fill #0

## 2024-10-08 MED ORDER — TRILIPIX 135 MG PO CPDR
135.0000 mg | DELAYED_RELEASE_CAPSULE | Freq: Every day | ORAL | 3 refills | Status: AC
  Filled 2024-10-08: qty 90, 90d supply, fill #0

## 2024-10-08 MED ORDER — TRELEGY ELLIPTA 100-62.5-25 MCG/ACT IN AEPB
1.0000 | INHALATION_SPRAY | Freq: Every day | RESPIRATORY_TRACT | 0 refills | Status: AC
  Filled 2024-10-08 – 2024-10-24 (×2): qty 180, 90d supply, fill #0

## 2024-10-08 MED ORDER — MONTELUKAST SODIUM 10 MG PO TABS
10.0000 mg | ORAL_TABLET | Freq: Every day | ORAL | 3 refills | Status: AC
  Filled 2024-10-08: qty 90, 90d supply, fill #0

## 2024-10-09 ENCOUNTER — Other Ambulatory Visit: Payer: Self-pay

## 2024-10-24 ENCOUNTER — Telehealth: Payer: Self-pay

## 2024-10-24 ENCOUNTER — Other Ambulatory Visit: Payer: Self-pay

## 2024-10-24 ENCOUNTER — Other Ambulatory Visit (HOSPITAL_COMMUNITY): Payer: Self-pay

## 2024-10-24 NOTE — Telephone Encounter (Signed)
 Copied from CRM #[id number]. Topic: Clinical - Medication Refill >> Oct 24, 2024  9:35 AM [name] wrote: Patient states sent a refill request to Dr. Kara Fluticasone -Umeclidin-Vilant (TRELEGY ELLIPTA ) 100-62.5-25 MCG/ACT AEPB for 90 days and with 3 refills to [facility] pharmacy Informed patient, Fluticasone -Umeclidin-Vilant (TRELEGY ELLIPTA ) 100-62.5-25 MCG/ACT AEPB was refilled 10/08/24 to [facility] pharmacy [address], [place] [address]Eynwz: [phone]  Fax: [phone]. Patient was a patient of Dr. Neysa, but also sees Dr. Kara. Informed patient, last seen 01/01/24 and Follow up in 1 year after CT Chest scan. CT scan instructions, Please schedule at [facility] in March 2026. Patient will call back to schedule.   Advised patient, Rx refills may take up to 3 business days. We ask that you follow-up with your pharmacy in a week in advance to prevent any delays in medication(s) refill. Patient states did contact the [facility] pharmacy, and has not been called back.  Spoke with patient looking into Cost of INH -    And schedule CT @ [facility]

## 2024-10-25 ENCOUNTER — Other Ambulatory Visit (HOSPITAL_COMMUNITY): Payer: Self-pay

## 2024-10-25 NOTE — Telephone Encounter (Signed)
 Reached out to patient will call insurance to see if it is a deductible or they have a preferred  INH

## 2025-06-04 ENCOUNTER — Ambulatory Visit: Admitting: Urology
